# Patient Record
Sex: Male | Born: 1990
Health system: Southern US, Community
[De-identification: ages and names within clinical notes are randomized; demographics above are authoritative.]

## PROBLEM LIST (undated history)

## (undated) DIAGNOSIS — F32A Depression, unspecified: Secondary | ICD-10-CM

## (undated) DIAGNOSIS — Z8619 Personal history of other infectious and parasitic diseases: Secondary | ICD-10-CM

## (undated) DIAGNOSIS — R740 Nonspecific elevation of levels of transaminase and lactic acid dehydrogenase [LDH]: Principal | ICD-10-CM

## (undated) DIAGNOSIS — M2240 Chondromalacia patellae, unspecified knee: Secondary | ICD-10-CM

## (undated) DIAGNOSIS — T7840XA Allergy, unspecified, initial encounter: Secondary | ICD-10-CM

## (undated) DIAGNOSIS — M940 Chondrocostal junction syndrome [Tietze]: Secondary | ICD-10-CM

## (undated) DIAGNOSIS — K219 Gastro-esophageal reflux disease without esophagitis: Secondary | ICD-10-CM

## (undated) HISTORY — DX: Allergy, unspecified, initial encounter: T78.40XA

## (undated) HISTORY — DX: Chondrocostal junction syndrome (tietze): M94.0

## (undated) HISTORY — PX: OTHER SURGICAL HISTORY: SHX169

## (undated) HISTORY — DX: Depression, unspecified: F32.A

## (undated) HISTORY — DX: Gastro-esophageal reflux disease without esophagitis: K21.9

## (undated) HISTORY — DX: Personal history of other infectious and parasitic diseases: Z86.19

## (undated) HISTORY — DX: Nonspecific elevation of levels of transaminase and lactic acid dehydrogenase (ldh): R74.0

## (undated) HISTORY — DX: Chondromalacia patellae, unspecified knee: M22.40

## (undated) HISTORY — PX: HEMORRHOID BANDING: SHX5850

---

## 2009-03-12 DEATH — deceased

## 2012-05-03 ENCOUNTER — Ambulatory Visit: Payer: Self-pay | Admitting: Family Medicine

## 2013-05-17 LAB — CBC AND DIFFERENTIAL
HCT: 42 % (ref 41–53)
Hemoglobin: 14 g/dL (ref 13.5–17.5)
PLATELETS: 225 10*3/uL (ref 150–399)
WBC: 6.2 10^3/mL

## 2013-05-17 LAB — LIPID PANEL
Cholesterol: 188 mg/dL (ref 0–200)
HDL: 33 mg/dL — AB (ref 35–70)
LDL CALC: 111 mg/dL
TRIGLYCERIDES: 222 mg/dL — AB (ref 40–160)

## 2013-05-17 LAB — BASIC METABOLIC PANEL
BUN: 15 mg/dL (ref 4–21)
CREATININE: 0.9 mg/dL (ref 0.6–1.3)
Glucose: 87 mg/dL
Potassium: 4.6 mmol/L (ref 3.4–5.3)
SODIUM: 141 mmol/L (ref 137–147)

## 2013-05-17 LAB — TSH: TSH: 2.45 u[IU]/mL (ref 0.41–5.90)

## 2013-06-23 LAB — HEPATIC FUNCTION PANEL
ALT: 21 U/L (ref 10–40)
AST: 16 U/L (ref 14–40)

## 2015-12-30 DIAGNOSIS — T753XXA Motion sickness, initial encounter: Secondary | ICD-10-CM | POA: Insufficient documentation

## 2015-12-30 DIAGNOSIS — R519 Headache, unspecified: Secondary | ICD-10-CM | POA: Insufficient documentation

## 2015-12-30 DIAGNOSIS — J309 Allergic rhinitis, unspecified: Secondary | ICD-10-CM | POA: Insufficient documentation

## 2015-12-30 DIAGNOSIS — M791 Myalgia, unspecified site: Secondary | ICD-10-CM | POA: Insufficient documentation

## 2015-12-30 DIAGNOSIS — M940 Chondrocostal junction syndrome [Tietze]: Secondary | ICD-10-CM | POA: Insufficient documentation

## 2015-12-30 DIAGNOSIS — R748 Abnormal levels of other serum enzymes: Secondary | ICD-10-CM | POA: Insufficient documentation

## 2015-12-30 DIAGNOSIS — R51 Headache: Secondary | ICD-10-CM

## 2015-12-30 DIAGNOSIS — E669 Obesity, unspecified: Secondary | ICD-10-CM | POA: Insufficient documentation

## 2015-12-30 DIAGNOSIS — R002 Palpitations: Secondary | ICD-10-CM | POA: Insufficient documentation

## 2015-12-30 DIAGNOSIS — M2550 Pain in unspecified joint: Secondary | ICD-10-CM | POA: Insufficient documentation

## 2015-12-30 HISTORY — DX: Chondrocostal junction syndrome (tietze): M94.0

## 2015-12-31 ENCOUNTER — Ambulatory Visit (INDEPENDENT_AMBULATORY_CARE_PROVIDER_SITE_OTHER): Payer: BLUE CROSS/BLUE SHIELD | Admitting: Family Medicine

## 2015-12-31 ENCOUNTER — Encounter: Payer: Self-pay | Admitting: Family Medicine

## 2015-12-31 VITALS — BP 110/62 | HR 78 | Temp 98.0°F | Resp 18 | Wt 321.0 lb

## 2015-12-31 DIAGNOSIS — H6692 Otitis media, unspecified, left ear: Secondary | ICD-10-CM

## 2015-12-31 MED ORDER — AMOXICILLIN 500 MG PO CAPS: 1000.0000 mg | ORAL_CAPSULE | Freq: Two times a day (BID) | ORAL | Status: AC

## 2015-12-31 NOTE — Progress Notes (Signed)
       Patient: Jesus Gardner Male    DOB: 04/21/1991   25 y.o.   MRN: [id number] Visit Date: 12/31/2015  Today's Provider: Mila Merryonald Nasier Thumm, MD   Chief Complaint  Patient presents with  . Ear Pain   Subjective:    HPI Ear Pain:  Patient comes in complaining of left ear pain for the past 2 days. Symptoms started as a sensation that water was in his ear. He states the ear pain started shortly after. Patient has tried using ear drops for tinnitus  with no relief. Other associated symptoms includes left ear pressure, headache, nausea and vomiting last night due to pain and headache. Had some URI symptoms last week.     Allergies  Allergen Reactions  . Pollen Extract    Previous Medications   FEXOFENADINE-PSEUDOEPHEDRINE (ALLEGRA-D ALLERGY & CONGESTION) 180-240 MG 24 HR TABLET    Take 1 tablet by mouth daily.   FLUTICASONE (FLONASE) 50 MCG/ACT NASAL SPRAY    Place 2 sprays into both nostrils daily.   OMEGA-3 FATTY ACIDS (FISH OIL) 1000 MG CAPS    Take 2 capsules by mouth 2 (two) times daily.    Review of Systems  Constitutional: Negative for fever, chills, diaphoresis, appetite change and fatigue.  HENT: Positive for ear pain, hearing loss and tinnitus (roaring sound in left ear). Negative for congestion, ear discharge, facial swelling, sinus pressure, sneezing and sore throat.   Respiratory: Positive for cough. Negative for chest tightness, shortness of breath and wheezing.   Cardiovascular: Negative for chest pain and palpitations.  Gastrointestinal: Negative for nausea, vomiting and abdominal pain.    Social History  Substance Use Topics  . Smoking status: Never Smoker   . Smokeless tobacco: Not on file  . Alcohol Use: No   Objective:   BP 110/62 mmHg  Pulse 78  Temp(Src) 98 F (36.7 C) (Oral)  Resp 18  Wt 321 lb (145.605 kg)  SpO2 96%  Physical Exam  General Appearance:    Alert, cooperative, no distress  HENT:   right TM normal without fluid or infection, left TM  red, dull, bulging, neck without nodes, sinuses nontender and nasal mucosa congested  Eyes:    PERRL, conjunctiva/corneas clear, EOM's intact       Lungs:     Clear to auscultation bilaterally, respirations unlabored  Heart:    Regular rate and rhythm  Neurologic:   Awake, alert, oriented x 3. No apparent focal neurological           defect.           Assessment & Plan:     1. Acute left otitis media, recurrence not specified, unspecified otitis media type  - amoxicillin (AMOXIL) 500 MG capsule; Take 2 capsules (1,000 mg total) by mouth 2 (two) times daily.  Dispense: 40 capsule; Refill: 0  Call if symptoms change or if not rapidly improving.           Mila Merryonald Chikita Dogan, MD  [facility]

## 2016-01-01 ENCOUNTER — Encounter: Payer: Self-pay | Admitting: Family Medicine

## 2016-01-14 ENCOUNTER — Encounter: Payer: Self-pay | Admitting: Family Medicine

## 2016-01-17 ENCOUNTER — Encounter: Payer: Self-pay | Admitting: Family Medicine

## 2016-01-17 ENCOUNTER — Ambulatory Visit (INDEPENDENT_AMBULATORY_CARE_PROVIDER_SITE_OTHER): Payer: BLUE CROSS/BLUE SHIELD | Admitting: Family Medicine

## 2016-01-17 VITALS — BP 118/70 | HR 64 | Temp 98.2°F | Resp 18 | Ht 75.0 in | Wt 323.0 lb

## 2016-01-17 DIAGNOSIS — F40243 Fear of flying: Secondary | ICD-10-CM

## 2016-01-17 DIAGNOSIS — Z0000 Encounter for general adult medical examination without abnormal findings: Secondary | ICD-10-CM | POA: Diagnosis not present

## 2016-01-17 DIAGNOSIS — E669 Obesity, unspecified: Secondary | ICD-10-CM

## 2016-01-17 MED ORDER — ALPRAZOLAM 0.5 MG PO TABS: ORAL_TABLET | ORAL | Status: DC

## 2016-01-17 MED ORDER — ALPRAZOLAM 0.5 MG PO TABS
0.5000 mg | ORAL_TABLET | ORAL | Status: DC | PRN
Start: 2016-01-17 — End: 2016-01-17

## 2016-01-17 NOTE — Patient Instructions (Addendum)
   It is recommended to engage in 150 minutes of vigorous exercise every week.    Try to limit caloric intake to < 2000 calories/day

## 2016-01-17 NOTE — Progress Notes (Signed)
Patient ID: Jesus Gardner, male   DOB: November 05, 1990, 25 y.o.   MRN: [id number]       Patient: Jesus Gardner, Male    DOB: 1991-07-17, 25 y.o.   MRN: [id number] Visit Date: 01/17/2016  Today's Provider: Lelon Huh, MD   Chief Complaint  Patient presents with  . Annual Exam  . Hyperlipidemia  . Elevated Hepatic Enzymes   Subjective:    Annual physical exam Jesus Gardner is a 25 y.o. male who presents today for health maintenance and complete physical. He feels well. He reports exercising no regularly. He reports he is sleeping well. He sleeps on average 6 hours a night.    Lipid/Cholesterol, Follow-up:   Last seen for this6 months ago.  Management changes since that visit include starting fish oil OTC. Marland Kitchen Last Lipid Panel:    Component Value Date/Time   CHOL 188 05/17/2013   TRIG 222* 05/17/2013   HDL 33* 05/17/2013   LDLCALC 111 05/17/2013     He reports good compliance with treatment. He is not having side effects.  Current symptoms include none and have been stable. Weight trend: stable Prior visit with dietician: no Current diet: well balanced Current exercise: none  Wt Readings from Last 3 Encounters:  01/17/16 323 lb (146.512 kg)  12/31/15 321 lb (145.605 kg)  02/02/14 325 lb (147.419 kg)    -------------------------------------------------------------------  He also reports he is getting married next month and going to [place] for his Honeymoon, but has never flown in the past is somewhat anxious about it.     Review of Systems  Constitutional: Negative.   HENT: Negative.   Eyes: Negative.   Respiratory: Negative.   Cardiovascular: Negative.   Gastrointestinal: Negative.   Endocrine: Negative.   Genitourinary: Negative.   Musculoskeletal: Negative.   Skin: Negative.   Allergic/Immunologic: Positive for environmental allergies.  Neurological: Negative.   Hematological: Negative.   Psychiatric/Behavioral: Negative.     Social History      He   reports that he has never smoked. He does not have any smokeless tobacco history on file. He reports that he does not drink alcohol or use illicit drugs.       Social History   Social History  . Marital Status: Single    Spouse Name: N/A  . Number of Children: N/A  . Years of Education: N/A   Social History Main Topics  . Smoking status: Never Smoker   . Smokeless tobacco: None  . Alcohol Use: No  . Drug Use: No  . Sexual Activity: Not Asked   Other Topics Concern  . None   Social History Narrative    Past Medical History  Diagnosis Date  . History of chicken pox      Patient Active Problem List   Diagnosis Date Noted  . Allergic rhinitis 12/30/2015  . Arthralgia 12/30/2015  . Myalgia 12/30/2015  . Costochondritis 12/30/2015  . Palpitations 12/30/2015  . Headache 12/30/2015  . Elevated liver enzymes 12/30/2015  . Motion sickness 12/30/2015  . Obesity 12/30/2015  . Hyperlipidemia, mixed 03/06/2009  . Fam hx-ischem heart disease 10/12/1998    Past Surgical History  Procedure Laterality Date  . None      Family History        Family Status  Relation Status Death Age  . Mother Deceased   . Father Alive     history of substance abuse  . Sister Alive   . Brother Alive  His family history includes CAD in his father; Cerebrovascular Disease in his father; Diabetes in his father; Non-Hodgkin's lymphoma in his mother.    Allergies  Allergen Reactions  . Pollen Extract     Previous Medications   FEXOFENADINE-PSEUDOEPHEDRINE (ALLEGRA-D ALLERGY & CONGESTION) 180-240 MG 24 HR TABLET    Take 1 tablet by mouth daily.   FLUTICASONE (FLONASE) 50 MCG/ACT NASAL SPRAY    Place 2 sprays into both nostrils daily.   OMEGA-3 FATTY ACIDS (FISH OIL) 1000 MG CAPS    Take 2 capsules by mouth 2 (two) times daily.    Patient Care Team: Birdie Sons, MD as PCP - General (Family Medicine)     Objective:   Vitals: BP 118/70 mmHg  Pulse 64  Temp(Src) 98.2 F  (36.8 C)  Resp 18  Ht '6\' 3"'  (1.905 m)  Wt 323 lb (146.512 kg)  BMI 40.37 kg/m2  SpO2 97%   Physical Exam    General Appearance:    Alert, cooperative, no distress, appears stated age, obesity  Head:    Normocephalic, without obvious abnormality, atraumatic  Eyes:    PERRL, conjunctiva/corneas clear, EOM's intact, fundi    benign, both eyes       Ears:    Normal TM's and external ear canals, both ears  Nose:   Nares normal, septum midline, mucosa normal, no drainage   or sinus tenderness  Throat:   Lips, mucosa, and tongue normal; teeth and gums normal  Neck:   Supple, symmetrical, trachea midline, no adenopathy;       thyroid:  No enlargement/tenderness/nodules; no carotid   bruit or JVD  Back:     Symmetric, no curvature, ROM normal, no CVA tenderness  Lungs:     Clear to auscultation bilaterally, respirations unlabored  Chest wall:    No tenderness or deformity  Heart:    Regular rate and rhythm, S1 and S2 normal, no murmur, rub   or gallop  Abdomen:     Soft, non-tender, bowel sounds active all four quadrants,    no masses, no organomegaly  Genitalia:    deferred  Rectal:    deferred  Extremities:   Extremities normal, atraumatic, no cyanosis or edema  Pulses:   2+ and symmetric all extremities  Skin:   Skin color, texture, turgor normal, no rashes or lesions  Lymph nodes:   Cervical, supraclavicular, and axillary nodes normal  Neurologic:   CNII-XII intact. Normal strength, sensation and reflexes      throughout      Assessment & Plan:     Routine Health Maintenance and Physical Exam  Exercise Activities and Dietary recommendations Goals    None      Immunization History  Administered Date(s) Administered  . DTaP 12/18/1988, 02/16/1989, 04/26/1989, 04/29/1990, 02/13/1993  . Hepatitis B 08/06/2000, 09/10/2000, 02/11/2001  . HiB (PRP-OMP) 10/22/1989, 01/28/1990  . IPV 12/18/1988, 02/16/1989, 04/29/1990, 02/13/1993  . MMR 01/28/1990, 02/13/1993  . Td  03/24/2006  . Tdap 04/26/2012    Health Maintenance  Topic Date Due  . HIV Screening  10/21/2003  . INFLUENZA VACCINE  05/12/2016  . TETANUS/TDAP  04/26/2022      Discussed health benefits of physical activity, and encouraged him to engage in regular exercise appropriate for his age and condition.    --------------------------------------------------------------------  1. Annual physical exam Check labs today. He cannot return fasting due to schedule conflicts with work  - Comprehensive metabolic panel - Lipid panel  2. Obesity He is very  concerned about this, and has not been following his previous diet and exercise regiment to to time constraints with work. He is planning and getting on strict regiment to get his weight back down over the next few months.   3. Fear of flying  - ALPRAZolam (XANAX) 0.5 MG tablet; One tablet at least 30 minutes before flying, and every four hours as needed  Dispense: 10 tablet; Refill: 0

## 2016-01-18 LAB — COMPREHENSIVE METABOLIC PANEL
ALK PHOS: 54 IU/L (ref 39–117)
ALT: 54 IU/L — AB (ref 0–44)
AST: 151 IU/L — AB (ref 0–40)
Albumin/Globulin Ratio: 2 (ref 1.2–2.2)
Albumin: 4.5 g/dL (ref 3.5–5.5)
BILIRUBIN TOTAL: 0.4 mg/dL (ref 0.0–1.2)
BUN/Creatinine Ratio: 27 — ABNORMAL HIGH (ref 9–20)
BUN: 21 mg/dL — AB (ref 6–20)
CHLORIDE: 99 mmol/L (ref 96–106)
CO2: 27 mmol/L (ref 18–29)
CREATININE: 0.79 mg/dL (ref 0.76–1.27)
Calcium: 9.6 mg/dL (ref 8.7–10.2)
GFR calc Af Amer: 142 mL/min/{1.73_m2} (ref >59)
GFR calc non Af Amer: 123 mL/min/{1.73_m2} (ref >59)
GLUCOSE: 86 mg/dL (ref 65–99)
Globulin, Total: 2.3 g/dL (ref 1.5–4.5)
Potassium: 4.6 mmol/L (ref 3.5–5.2)
Sodium: 141 mmol/L (ref 134–144)
Total Protein: 6.8 g/dL (ref 6.0–8.5)

## 2016-01-18 LAB — LIPID PANEL
CHOLESTEROL TOTAL: 161 mg/dL (ref 100–199)
Chol/HDL Ratio: 5 ratio units (ref 0.0–5.0)
HDL: 32 mg/dL — ABNORMAL LOW (ref >39)
LDL CALC: 93 mg/dL (ref 0–99)
TRIGLYCERIDES: 178 mg/dL — AB (ref 0–149)
VLDL CHOLESTEROL CAL: 36 mg/dL (ref 5–40)

## 2016-01-22 ENCOUNTER — Encounter: Payer: Self-pay | Admitting: Family Medicine

## 2016-01-22 ENCOUNTER — Telehealth: Payer: Self-pay | Admitting: Family Medicine

## 2016-01-22 ENCOUNTER — Telehealth: Payer: Self-pay

## 2016-01-22 DIAGNOSIS — R945 Abnormal results of liver function studies: Principal | ICD-10-CM

## 2016-01-22 DIAGNOSIS — R7989 Other specified abnormal findings of blood chemistry: Secondary | ICD-10-CM

## 2016-01-22 NOTE — Telephone Encounter (Signed)
Pt mom, Renea Eevelyn called stating she has questions about the results of lab test.  [phone]/WJCB#[phone]/MW

## 2016-01-22 NOTE — Telephone Encounter (Signed)
-----   Message from Malva Limesonald E Fisher, MD sent at 01/22/2016  7:55 AM EDT ----- Liver functions are elevated. Need abdominal u/s to evaluate liver. Also, please add hepatitis B surface antibody, hepatitis B core antibody, and hepatitis C antibody to labs. Thanks.

## 2016-01-22 NOTE — Telephone Encounter (Signed)
Patient advised as directed below. Abdominal U/S ordered. Labs added on, awaiting confirmation.

## 2016-01-22 NOTE — Telephone Encounter (Signed)
Returned call to [place]. Renea Eevelyn wanted to make sure Dr. Sherrie MustacheFisher is aware that pt's mother died of Non-Hodgkin's lymphoma at age 27.

## 2016-01-24 ENCOUNTER — Telehealth: Payer: Self-pay | Admitting: Family Medicine

## 2016-01-24 LAB — HEPATITIS C ANTIBODY: Hep C Virus Ab: <0.1 s/co ratio (ref 0.0–0.9)

## 2016-01-24 LAB — HEPATITIS B SURFACE ANTIBODY,QUALITATIVE: Hep B Surface Ab, Qual: NONREACTIVE

## 2016-01-24 LAB — SPECIMEN STATUS REPORT

## 2016-01-24 LAB — HEPATITIS B CORE ANTIBODY, IGM: Hep B C IgM: NEGATIVE

## 2016-01-24 NOTE — Telephone Encounter (Signed)
Pt states Dr Sherrie MustacheFisher has advised he needs an ultra sound.  Pt states per his insurance this will cost him $500.00 out of pocket.  Pt staters he can not pay that right now and is asking if there is something that can be done less expensive.  [id number]#[phone]/VHCB#[phone]/MW

## 2016-01-27 NOTE — Telephone Encounter (Signed)
Pt states Dr Fisher has advised he needs an ultra sound.  Pt states per his insurance this will cost him $500.00 out of pocket.  Pt staters he can not pay that right now and is asking if there is something that can be done less expensive.  CB#[phone]/MW °

## 2016-01-27 NOTE — Telephone Encounter (Signed)
The most common cause of elevated liver enzymes is fatty liver disease, which is caused by being overweight and consuming to much sugar and starchy foods. He can get on a strict low sugar diet and work on losing weight and we can recheck liver functions in 3 months.

## 2016-01-27 NOTE — Telephone Encounter (Signed)
This is the wrong chart

## 2016-01-28 ENCOUNTER — Ambulatory Visit: Payer: BLUE CROSS/BLUE SHIELD

## 2016-01-28 NOTE — Telephone Encounter (Signed)
Patient's mom was notified. Expressed understanding.

## 2016-02-17 ENCOUNTER — Encounter: Payer: Self-pay | Admitting: Family Medicine

## 2016-02-17 ENCOUNTER — Ambulatory Visit (INDEPENDENT_AMBULATORY_CARE_PROVIDER_SITE_OTHER): Payer: BLUE CROSS/BLUE SHIELD | Admitting: Family Medicine

## 2016-02-17 ENCOUNTER — Ambulatory Visit: Payer: Self-pay | Admitting: Family Medicine

## 2016-02-17 VITALS — BP 110/70 | HR 67 | Temp 98.9°F | Resp 16 | Ht 75.0 in | Wt 315.0 lb

## 2016-02-17 DIAGNOSIS — H6691 Otitis media, unspecified, right ear: Secondary | ICD-10-CM

## 2016-02-17 MED ORDER — AMOXICILLIN 500 MG PO CAPS: 1000.0000 mg | ORAL_CAPSULE | Freq: Two times a day (BID) | ORAL | Status: AC

## 2016-02-17 NOTE — Progress Notes (Signed)
Patient: Jesus ShieldsMichael D Fitz Male    DOB: 10/25/1990   25 y.o.   MRN: [id number] Visit Date: 02/17/2016  Today's Provider: Mila Merryonald Fisher, MD   Chief Complaint  Patient presents with  . Ear Fullness    right   Subjective:    Ear Fullness  There is pain in the right ear. This is a new problem. The current episode started in the past 7 days (2 days). The problem occurs constantly. The problem has been unchanged. There has been no fever. The pain is mild. Associated symptoms include coughing and hearing loss. Pertinent negatives include no abdominal pain, diarrhea, ear discharge, headaches, neck pain, rash, rhinorrhea, sore throat or vomiting. Treatments tried: allergra D. The treatment provided mild relief. There is no history of a chronic ear infection, hearing loss or a tympanostomy tube.    Ear fullness for 2 days. Ear is stopped-up with mild pain. No fever and Mild cough. Has taken allegra d otc with mild relief.      Allergies  Allergen Reactions  . Pollen Extract    Previous Medications   FEXOFENADINE-PSEUDOEPHEDRINE (ALLEGRA-D ALLERGY & CONGESTION) 180-240 MG 24 HR TABLET    Take 1 tablet by mouth daily.   FLUTICASONE (FLONASE) 50 MCG/ACT NASAL SPRAY    Place 2 sprays into both nostrils daily.   MULTIPLE VITAMIN (MULTIVITAMIN) CAPSULE    Take 1 capsule by mouth daily.   OMEGA-3 FATTY ACIDS (FISH OIL) 1000 MG CAPS    Take 2 capsules by mouth 2 (two) times daily.    Review of Systems  Constitutional: Negative for fever, chills and appetite change.  HENT: Positive for congestion and hearing loss. Negative for ear discharge, rhinorrhea and sore throat.   Respiratory: Positive for cough. Negative for chest tightness, shortness of breath and wheezing.   Cardiovascular: Negative for chest pain and palpitations.  Gastrointestinal: Negative for nausea, vomiting, abdominal pain and diarrhea.  Musculoskeletal: Negative for neck pain.  Skin: Negative for rash.  Neurological:  Negative for headaches.    Social History  Substance Use Topics  . Smoking status: Never Smoker   . Smokeless tobacco: Not on file  . Alcohol Use: No   Objective:   BP 110/70 mmHg  Pulse 67  Temp(Src) 98.9 F (37.2 C) (Oral)  Resp 16  Ht 6\' 3"  (1.905 m)  Wt 315 lb (142.883 kg)  BMI 39.37 kg/m2  SpO2 97%  Physical Exam  General Appearance:    Alert, cooperative, no distress  HENT:   left TM normal without fluid or infection, left TM red, dull, bulging, neck without nodes, throat normal without erythema or exudate, sinuses nontender and nasal mucosa congested  Eyes:    PERRL, conjunctiva/corneas clear, EOM's intact       Lungs:     Clear to auscultation bilaterally, respirations unlabored  Heart:    Regular rate and rhythm  Neurologic:   Awake, alert, oriented x 3. No apparent focal neurological           defect.            Assessment & Plan:     1. Acute right otitis media, recurrence not specified, unspecified otitis media type   - amoxicillin (AMOXIL) 500 MG capsule; Take 2 capsules (1,000 mg total) by mouth 2 (two) times daily.  Dispense: 40 capsule; Refill: 0   Call if symptoms change or if not rapidly improving.          Mila Merryonald Fisher,  MD  [place]

## 2016-03-12 ENCOUNTER — Telehealth: Payer: Self-pay | Admitting: Family Medicine

## 2016-03-12 ENCOUNTER — Other Ambulatory Visit: Payer: Self-pay | Admitting: Family Medicine

## 2016-03-12 ENCOUNTER — Encounter: Payer: Self-pay | Admitting: Family Medicine

## 2016-03-12 DIAGNOSIS — R7401 Elevation of levels of liver transaminase levels: Secondary | ICD-10-CM | POA: Insufficient documentation

## 2016-03-12 DIAGNOSIS — R740 Nonspecific elevation of levels of transaminase and lactic acid dehydrogenase [LDH]: Principal | ICD-10-CM

## 2016-03-12 HISTORY — DX: Elevation of levels of liver transaminase levels: R74.01

## 2016-03-12 NOTE — Telephone Encounter (Signed)
Please advise patient it is time to recheck liver functions. Order has been printed. Please leave at front desk to pick up.

## 2016-03-13 NOTE — Telephone Encounter (Signed)
Left message to call back  

## 2016-03-18 NOTE — Telephone Encounter (Signed)
LMTCB ED 

## 2016-03-25 NOTE — Telephone Encounter (Signed)
Left detailed message on pt's vm. Have been unable to reach he by phone.

## 2016-04-27 ENCOUNTER — Telehealth: Payer: Self-pay | Admitting: Family Medicine

## 2016-04-27 NOTE — Telephone Encounter (Signed)
Pt returned call from 03/12/16. The message from Dr. Sherrie MustacheFisher on 03/12/16 was to advise pt it is time to recheck his liver function. Pt stated that he was on his honeymoon and had dropped his phone in the pool and just now got the messages. Please reorder the labs so pt can pick up a lab slip b/c it has been 30 days since the order was placed. Please advise. Thanks TNP

## 2016-04-27 NOTE — Telephone Encounter (Signed)
Returned pt's call. Notified that the lab slip is still good.

## 2016-05-01 DIAGNOSIS — R740 Nonspecific elevation of levels of transaminase and lactic acid dehydrogenase [LDH]: Secondary | ICD-10-CM | POA: Diagnosis not present

## 2016-05-02 LAB — HEPATIC FUNCTION PANEL
ALT: 19 IU/L (ref 0–44)
AST: 15 IU/L (ref 0–40)
Albumin: 4.3 g/dL (ref 3.5–5.5)
Alkaline Phosphatase: 54 IU/L (ref 39–117)
BILIRUBIN, DIRECT: 0.15 mg/dL (ref 0.00–0.40)
Bilirubin Total: 0.7 mg/dL (ref 0.0–1.2)
TOTAL PROTEIN: 6.8 g/dL (ref 6.0–8.5)

## 2016-05-04 ENCOUNTER — Telehealth: Payer: Self-pay

## 2016-05-04 NOTE — Telephone Encounter (Signed)
-----   Message from Malva Limes, MD sent at 05/04/2016  1:19 PM EDT ----- Liver functions are much better... Back down to normal. Avoid tylenol and avoid alcohol. Check liver functions about once a year.

## 2016-05-04 NOTE — Telephone Encounter (Signed)
Pt advised.   Thanks,   -Jesus Gardner  

## 2016-09-07 ENCOUNTER — Encounter: Payer: Self-pay | Admitting: Family Medicine

## 2016-09-07 ENCOUNTER — Ambulatory Visit (INDEPENDENT_AMBULATORY_CARE_PROVIDER_SITE_OTHER): Payer: 59 | Admitting: Family Medicine

## 2016-09-07 VITALS — BP 122/80 | HR 74 | Temp 98.9°F | Resp 16 | Ht 75.0 in | Wt 319.0 lb

## 2016-09-07 DIAGNOSIS — H66001 Acute suppurative otitis media without spontaneous rupture of ear drum, right ear: Secondary | ICD-10-CM | POA: Diagnosis not present

## 2016-09-07 MED ORDER — AMOXICILLIN 500 MG PO CAPS: 1000.0000 mg | ORAL_CAPSULE | Freq: Two times a day (BID) | ORAL | 0 refills | Status: AC

## 2016-09-07 NOTE — Progress Notes (Signed)
       Patient: Jesus ShieldsMichael D Schinke Male    DOB: 05/05/1991   25 y.o.   MRN: [id number] Visit Date: 09/07/2016  Today's Provider: Mila Merryonald Shantil Vallejo, MD   Chief Complaint  Patient presents with  . Ear Pain   Subjective:    HPI Patient comes in today c/o right ear pain. He reports that he has had symptoms X 3-4 days. He also mentions that he has had a cough (this started today), low grade temp, and nasal congestion. Patient has been taking his regular allergy medications along with ibuprofen for his symptoms with no relief. Patient reports that he does occasionally have dizzy spells that come and go. He reports that he has not had dizziness today.     Allergies  Allergen Reactions  . Pollen Extract      Current Outpatient Prescriptions:  .  fexofenadine-pseudoephedrine (ALLEGRA-D ALLERGY & CONGESTION) 180-240 MG 24 hr tablet, Take 1 tablet by mouth daily., Disp: , Rfl:  .  fluticasone (FLONASE) 50 MCG/ACT nasal spray, Place 2 sprays into both nostrils daily., Disp: , Rfl:  .  Multiple Vitamin (MULTIVITAMIN) capsule, Take 1 capsule by mouth daily., Disp: , Rfl:  .  Omega-3 Fatty Acids (FISH OIL) 1000 MG CAPS, Take 2 capsules by mouth 2 (two) times daily., Disp: , Rfl:   Review of Systems  Constitutional: Positive for fatigue and fever. Negative for activity change, appetite change, chills, diaphoresis and unexpected weight change.  HENT: Positive for congestion, ear pain and postnasal drip.   Respiratory: Positive for cough. Negative for apnea, chest tightness, shortness of breath, wheezing and stridor.     Social History  Substance Use Topics  . Smoking status: Never Smoker  . Smokeless tobacco: Not on file  . Alcohol use No   Objective:   BP 122/80 (BP Location: Left Arm, Patient Position: Sitting, Cuff Size: Large)   Pulse 74   Temp 98.9 F (37.2 C)   Resp 16   Ht 6\' 3"  (1.905 m)   Wt (!) 319 lb (144.7 kg)   SpO2 96%   BMI 39.87 kg/m   Physical Exam  General Appearance:     Alert, cooperative, no distress  HENT:   left TM normal without fluid or infection, right TM red, dull, bulging, bilateral TM fluid noted, neck without nodes, throat normal without erythema or exudate, sinuses nontender and nasal mucosa congested  Eyes:    PERRL, conjunctiva/corneas clear, EOM's intact       Lungs:     Clear to auscultation bilaterally, respirations unlabored  Heart:    Regular rate and rhythm  Neurologic:   Awake, alert, oriented x 3. No apparent focal neurological           defect.           Assessment & Plan:     1. Acute suppurative otitis media of right ear without spontaneous rupture of tympanic membrane, recurrence not specified  - amoxicillin (AMOXIL) 500 MG capsule; Take 2 capsules (1,000 mg total) by mouth 2 (two) times daily.  Dispense: 40 capsule; Refill: 0  Call if symptoms change or if not rapidly improving.          Mila Merryonald Regina Coppolino, MD  [facility]

## 2016-09-07 NOTE — Patient Instructions (Signed)
Otitis Media, Adult Otitis media is redness, soreness, and puffiness (swelling) in the space just behind your eardrum (middle ear). It may be caused by allergies or infection. It often happens along with a cold. Follow these instructions at home:  Take your medicine as told. Finish it even if you start to feel better.  Only take over-the-counter or prescription medicines for pain, discomfort, or fever as told by your doctor.  Follow up with your doctor as told. Contact a doctor if:  You have otitis media only in one ear, or bleeding from your nose, or both.  You notice a lump on your neck.  You are not getting better in 3-5 days.  You feel worse instead of better. Get help right away if:  You have pain that is not helped with medicine.  You have puffiness, redness, or pain around your ear.  You get a stiff neck.  You cannot move part of your face (paralysis).  You notice that the bone behind your ear hurts when you touch it. This information is not intended to replace advice given to you by your health care provider. Make sure you discuss any questions you have with your health care provider. Document Released: 03/16/2008 Document Revised: 03/05/2016 Document Reviewed: 04/25/2013 Elsevier Interactive Patient Education  2017 [employer]  

## 2017-09-28 ENCOUNTER — Encounter: Payer: Self-pay | Admitting: Family Medicine

## 2017-10-13 ENCOUNTER — Ambulatory Visit (INDEPENDENT_AMBULATORY_CARE_PROVIDER_SITE_OTHER): Payer: 59 | Admitting: Family Medicine

## 2017-10-13 ENCOUNTER — Encounter: Payer: Self-pay | Admitting: Family Medicine

## 2017-10-13 VITALS — BP 118/82 | HR 72 | Temp 98.5°F | Resp 16 | Ht 75.0 in | Wt 333.0 lb

## 2017-10-13 DIAGNOSIS — Z0000 Encounter for general adult medical examination without abnormal findings: Secondary | ICD-10-CM | POA: Diagnosis not present

## 2017-10-13 DIAGNOSIS — Z6841 Body Mass Index (BMI) 40.0 and over, adult: Secondary | ICD-10-CM | POA: Diagnosis not present

## 2017-10-13 NOTE — Progress Notes (Signed)
Patient: Jesus Gardner, Male    DOB: [birth year], 27 y.o.   MRN: [id number] Visit Date: 10/13/2017  Today's Provider: Lelon Huh, MD   Chief Complaint  Patient presents with  . Annual Exam  . Gastroesophageal Reflux   Subjective:    Annual physical exam Jesus Gardner is a 27 y.o. male who presents today for health maintenance and complete physical. He feels well. He reports exercising not regularly. He reports he is sleeping fairly well.   GERD symptoms Patient reports that he is beginning to have more issues with acid reflux. He reports that his symptoms are in relation to certain foods, more tomato based products in particular. He reports that this is happening more frequently.    Review of Systems  Constitutional: Negative.   HENT: Negative.   Eyes: Negative.   Respiratory: Negative.   Cardiovascular: Negative.   Gastrointestinal: Negative.   Endocrine: Negative.   Genitourinary: Negative.   Musculoskeletal: Negative.   Skin: Negative.   Allergic/Immunologic: Negative.   Neurological: Negative.   Hematological: Negative.   Psychiatric/Behavioral: Negative.     Social History      He  reports that  has never smoked. he has never used smokeless tobacco. He reports that he does not drink alcohol or use drugs.       Social History   Socioeconomic History  . Marital status: Married    Spouse name: Not on file  . Number of children: Not on file  . Years of education: Not on file  . Highest education level: Not on file  Social Needs  . Financial resource strain: Not on file  . Food insecurity - worry: Not on file  . Food insecurity - inability: Not on file  . Transportation needs - medical: Not on file  . Transportation needs - non-medical: Not on file  Occupational History  . 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 Smoking status: Never Smoker  . Smokeless tobacco: Never Used  Substance and  Sexual Activity  . Alcohol use: No    Alcohol/week: 0.0 oz  . Drug use: No  . Sexual activity: Not on file  Other Topics Concern  . Not on file  Social History Narrative  . Not on file    Past Medical History:  Diagnosis Date  . Elevated transaminase level 03/12/2016  . History of chicken pox      Patient Active Problem List   Diagnosis Date Noted  . Elevated transaminase level 03/12/2016  . Allergic rhinitis 12/30/2015  . Arthralgia 12/30/2015  . Myalgia 12/30/2015  . Costochondritis 12/30/2015  . Palpitations 12/30/2015  . Headache 12/30/2015  . Motion sickness 12/30/2015  . Obesity 12/30/2015  . Hyperlipidemia, mixed 03/06/2009  . Fam hx-ischem heart disease 10/12/1998    Past Surgical History:  Procedure Laterality Date  . None      Family History        Family Status  Relation Name Status  . Mother  Deceased  . Father  Alive       history of substance abuse  . Sister half- sister Alive  . Brother half -brother Alive        His family history includes CAD in his father; Cerebrovascular Disease in his father; Diabetes in his father; Non-Hodgkin's lymphoma (age of onset: 29) in his mother.     Allergies  Allergen Reactions  .  Pollen Extract      Current Outpatient Medications:  .  fexofenadine-pseudoephedrine (ALLEGRA-D ALLERGY & CONGESTION) 180-240 MG 24 hr tablet, Take 1 tablet by mouth daily., Disp: , Rfl:  .  fluticasone (FLONASE) 50 MCG/ACT nasal spray, Place 2 sprays into both nostrils daily., Disp: , Rfl:  .  Multiple Vitamin (MULTIVITAMIN) capsule, Take 1 capsule by mouth daily., Disp: , Rfl:  .  Omega-3 Fatty Acids (FISH OIL) 1000 MG CAPS, Take 2 capsules by mouth 2 (two) times daily., Disp: , Rfl:    Patient Care Team: Birdie Sons, MD as PCP - General (Family Medicine)      Objective:   Vitals: BP 118/82 (BP Location: Right Arm, Patient Position: Sitting, Cuff Size: Large)   Pulse 72   Temp 98.5 F (36.9 C)   Resp 16   Ht 6' 3"  (1.905 m)   Wt (!) 333 lb (151 kg)   SpO2 99%   BMI 41.62 kg/m    Vitals:   10/13/17 1348  BP: 118/82  Pulse: 72  Resp: 16  Temp: 98.5 F (36.9 C)  SpO2: 99%  Weight: (!) 333 lb (151 kg)  Height: 6' 3" (1.905 m)    Wt Readings from Last 3 Encounters:  10/13/17 (!) 333 lb (151 kg)  09/07/16 (!) 319 lb (144.7 kg)  02/17/16 (!) 315 lb (142.9 kg)     Physical Exam   General Appearance:    Alert, cooperative, no distress, appears stated age  Head:    Normocephalic, without obvious abnormality, atraumatic  Eyes:    PERRL, conjunctiva/corneas clear, EOM's intact, fundi    benign, both eyes       Ears:    Normal TM's and external ear canals, both ears  Nose:   Nares normal, septum midline, mucosa normal, no drainage   or sinus tenderness  Throat:   Lips, mucosa, and tongue normal; teeth and gums normal  Neck:   Supple, symmetrical, trachea midline, no adenopathy;       thyroid:  No enlargement/tenderness/nodules; no carotid   bruit or JVD  Back:     Symmetric, no curvature, ROM normal, no CVA tenderness  Lungs:     Clear to auscultation bilaterally, respirations unlabored  Chest wall:    No tenderness or deformity  Heart:    Regular rate and rhythm, S1 and S2 normal, no murmur, rub   or gallop  Abdomen:     Soft, non-tender, bowel sounds active all four quadrants,    no masses, no organomegaly  Genitalia:    deferred  Rectal:    deferred  Extremities:   Extremities normal, atraumatic, no cyanosis or edema  Pulses:   2+ and symmetric all extremities  Skin:   Skin color, texture, turgor normal, no rashes or lesions  Lymph nodes:   Cervical, supraclavicular, and axillary nodes normal  Neurologic:   CNII-XII intact. Normal strength, sensation and reflexes      throughout    Depression Screen PHQ 2/9 Scores 10/13/2017  PHQ - 2 Score 2  PHQ- 9 Score 4      Assessment & Plan:     Routine Health Maintenance and Physical Exam  Exercise Activities and Dietary  recommendations Goals    None      Immunization History  Administered Date(s) Administered  . DTaP 12/18/1988, 02/16/1989, 04/26/1989, 04/29/1990, 02/13/1993  . Hepatitis B 08/06/2000, 09/10/2000, 02/11/2001  . HiB (PRP-OMP) 10/22/1989, 01/28/1990  . IPV 12/18/1988, 02/16/1989, 04/29/1990, 02/13/1993  . MMR  01/28/1990, 02/13/1993  . Td 03/24/2006  . Tdap 04/26/2012    Health Maintenance  Topic Date Due  . HIV Screening  10/21/2003  . INFLUENZA VACCINE  01/10/2018 (Originally 05/12/2017)  . TETANUS/TDAP  04/26/2022     Discussed health benefits of physical activity, and encouraged him to engage in regular exercise appropriate for his age and condition.    --------------------------------------------------------------------  1. Annual physical exam He refused flu vaccine - TSH - Lipid panel - Comprehensive metabolic panel  2. Class 3 severe obesity without serious comorbidity with body mass index (BMI) of 40.0 to 44.9 in adult, unspecified obesity type ([place]) Counseled regarding prudent diet and regular exercise. Considering history of mildly elevated triglycerides, recommended low glycemic index diet.   - TSH - Comprehensive metabolic panel     Lelon Huh, MD  [facility]

## 2017-10-13 NOTE — Patient Instructions (Addendum)
   If you have acid reflux symptoms more than 3 days a week, you should take Prilosec OTC once a day   Gastroesophageal Reflux Disease, Adult Normally, food travels down the esophagus and stays in the stomach to be digested. If a person has gastroesophageal reflux disease (GERD), food and stomach acid move back up into the esophagus. When this happens, the esophagus becomes sore and swollen (inflamed). Over time, GERD can make small holes (ulcers) in the lining of the esophagus. Follow these instructions at home: Diet  Follow a diet as told by your doctor. You may need to avoid foods and drinks such as: ? Coffee and tea (with or without caffeine). ? Drinks that contain alcohol. ? Energy drinks and sports drinks. ? Carbonated drinks or sodas. ? Chocolate and cocoa. ? Peppermint and mint flavorings. ? Garlic and onions. ? Horseradish. ? Spicy and acidic foods, such as peppers, chili powder, curry powder, vinegar, hot sauces, and BBQ sauce. ? Citrus fruit juices and citrus fruits, such as oranges, lemons, and limes. ? Tomato-based foods, such as red sauce, chili, salsa, and pizza with red sauce. ? Fried and fatty foods, such as donuts, french fries, potato chips, and high-fat dressings. ? High-fat meats, such as hot dogs, rib eye steak, sausage, ham, and bacon. ? High-fat dairy items, such as whole milk, butter, and cream cheese.  Eat small meals often. Avoid eating large meals.  Avoid drinking large amounts of liquid with your meals.  Avoid eating meals during the 2-3 hours before bedtime.  Avoid lying down right after you eat.  Do not exercise right after you eat. General instructions  Pay attention to any changes in your symptoms.  Take over-the-counter and prescription medicines only as told by your doctor. Do not take aspirin, ibuprofen, or other NSAIDs unless your doctor says it is okay.  Do not use any tobacco products, including cigarettes, chewing tobacco, and  e-cigarettes. If you need help quitting, ask your doctor.  Wear loose clothes. Do not wear anything tight around your waist.  Raise (elevate) the head of your bed about 6 inches (15 cm).  Try to lower your stress. If you need help doing this, ask your doctor.  If you are overweight, lose an amount of weight that is healthy for you. Ask your doctor about a safe weight loss goal.  Keep all follow-up visits as told by your doctor. This is important. Contact a doctor if:  You have new symptoms.  You lose weight and you do not know why it is happening.  You have trouble swallowing, or it hurts to swallow.  You have wheezing or a cough that keeps happening.  Your symptoms do not get better with treatment.  You have a hoarse voice. Get help right away if:  You have pain in your arms, neck, jaw, teeth, or back.  You feel sweaty, dizzy, or light-headed.  You have chest pain or shortness of breath.  You throw up (vomit) and your throw up looks like blood or coffee grounds.  You pass out (faint).  Your poop (stool) is bloody or black.  You cannot swallow, drink, or eat. This information is not intended to replace advice given to you by your health care provider. Make sure you discuss any questions you have with your health care provider. Document Released: 03/16/2008 Document Revised: 03/05/2016 Document Reviewed: 01/23/2015 Elsevier Interactive Patient Education  [employer]2018 Elsevier Inc.

## 2017-10-14 DIAGNOSIS — Z0000 Encounter for general adult medical examination without abnormal findings: Secondary | ICD-10-CM | POA: Diagnosis not present

## 2017-10-14 DIAGNOSIS — Z6841 Body Mass Index (BMI) 40.0 and over, adult: Secondary | ICD-10-CM | POA: Diagnosis not present

## 2017-10-15 ENCOUNTER — Telehealth: Payer: Self-pay | Admitting: Emergency Medicine

## 2017-10-15 LAB — COMPREHENSIVE METABOLIC PANEL
A/G RATIO: 1.7 (ref 1.2–2.2)
ALBUMIN: 4.3 g/dL (ref 3.5–5.5)
ALT: 34 IU/L (ref 0–44)
AST: 25 IU/L (ref 0–40)
Alkaline Phosphatase: 54 IU/L (ref 39–117)
BILIRUBIN TOTAL: 0.4 mg/dL (ref 0.0–1.2)
BUN / CREAT RATIO: 16 (ref 9–20)
BUN: 13 mg/dL (ref 6–20)
CHLORIDE: 105 mmol/L (ref 96–106)
CO2: 18 mmol/L — ABNORMAL LOW (ref 20–29)
Calcium: 9.1 mg/dL (ref 8.7–10.2)
Creatinine, Ser: 0.81 mg/dL (ref 0.76–1.27)
GFR calc Af Amer: 140 mL/min/{1.73_m2} (ref >59)
GFR calc non Af Amer: 121 mL/min/{1.73_m2} (ref >59)
GLOBULIN, TOTAL: 2.6 g/dL (ref 1.5–4.5)
Glucose: 96 mg/dL (ref 65–99)
POTASSIUM: 5 mmol/L (ref 3.5–5.2)
SODIUM: 141 mmol/L (ref 134–144)
TOTAL PROTEIN: 6.9 g/dL (ref 6.0–8.5)

## 2017-10-15 LAB — LIPID PANEL
CHOL/HDL RATIO: 5.2 ratio — AB (ref 0.0–5.0)
Cholesterol, Total: 187 mg/dL (ref 100–199)
HDL: 36 mg/dL — AB (ref >39)
LDL Calculated: 124 mg/dL — ABNORMAL HIGH (ref 0–99)
TRIGLYCERIDES: 137 mg/dL (ref 0–149)
VLDL Cholesterol Cal: 27 mg/dL (ref 5–40)

## 2017-10-15 LAB — TSH: TSH: 1.79 u[IU]/mL (ref 0.450–4.500)

## 2017-10-15 NOTE — Telephone Encounter (Signed)
LMTCB

## 2017-10-15 NOTE — Telephone Encounter (Signed)
-----   Message from Malva Limesonald E Fisher, MD sent at 10/15/2017  9:19 AM EST ----- LDL cholesterol a little bit high at 124, should be under 100, not high enough to require medications. Just eat healthy diet. Otherwise labs normal. Check lipids every 2-3 years.

## 2017-10-15 NOTE — Telephone Encounter (Signed)
Pt informed and voiced understanding of results. 

## 2018-02-24 ENCOUNTER — Other Ambulatory Visit: Payer: Self-pay | Admitting: Family Medicine

## 2018-02-24 MED ORDER — FLUTICASONE PROPIONATE 50 MCG/ACT NA SUSP: 2.0000 | Freq: Every day | NASAL | 5 refills | Status: DC

## 2018-02-24 NOTE — Telephone Encounter (Signed)
Pt called wanting to know if he could get a refill on his nasal spray.  Fluticasone 50 MCG  [facility]  Pt's call back is [phone]  Thanks teri

## 2018-09-02 ENCOUNTER — Ambulatory Visit (INDEPENDENT_AMBULATORY_CARE_PROVIDER_SITE_OTHER): Payer: 59 | Admitting: Family Medicine

## 2018-09-02 ENCOUNTER — Encounter: Payer: Self-pay | Admitting: Family Medicine

## 2018-09-02 VITALS — BP 122/78 | HR 72 | Temp 98.5°F | Resp 16 | Wt 315.0 lb

## 2018-09-02 DIAGNOSIS — N50811 Right testicular pain: Secondary | ICD-10-CM

## 2018-09-02 DIAGNOSIS — R079 Chest pain, unspecified: Secondary | ICD-10-CM | POA: Diagnosis not present

## 2018-09-02 MED ORDER — CIPROFLOXACIN HCL 500 MG PO TABS: 500.0000 mg | ORAL_TABLET | Freq: Two times a day (BID) | ORAL | 0 refills | Status: DC

## 2018-09-02 NOTE — Progress Notes (Addendum)
Patient: Jesus ShieldsMichael D Ethridge Male    DOB: 08/27/1991   27 y.o.   MRN: [id number] Visit Date: 09/02/2018  Today's Provider: Mila Merryonald Sheletha Bow, MD   Chief Complaint  Patient presents with  . Groin Pain   Subjective:    HPI Groin pain: Patient comes in complaining of pain and pressure of the right groin. Symptoms started about 2 months ago. He states he felt a sharp pain a few days ago at the end of intercourse and radiated into his back, and has since changed into a dull achy pain. Patient says today he feels a slight numbness in the right groin. Has had no injury that he knows of, has had no swelling or bulging.   He also reports that while driving to visit today he started having mild mid-sternal chest pain and feels like indigestion, as he has had more troubles with heart burn lately. Since EKG was done today, he feels like pain has mostly resolved.     Allergies  Allergen Reactions  . Pollen Extract      Current Outpatient Medications:  .  fexofenadine-pseudoephedrine (ALLEGRA-D ALLERGY & CONGESTION) 180-240 MG 24 hr tablet, Take 1 tablet by mouth daily., Disp: , Rfl:  .  fluticasone (FLONASE) 50 MCG/ACT nasal spray, Place 2 sprays into both nostrils daily., Disp: 16 g, Rfl: 5 .  Multiple Vitamin (MULTIVITAMIN) capsule, Take 1 capsule by mouth daily., Disp: , Rfl:  .  Omega-3 Fatty Acids (FISH OIL) 1000 MG CAPS, Take 2 capsules by mouth 2 (two) times daily., Disp: , Rfl:   Review of Systems  Constitutional: Negative for appetite change, chills and fever.  Respiratory: Negative for chest tightness, shortness of breath and wheezing.   Cardiovascular: Positive for chest pain (started today). Negative for palpitations.  Gastrointestinal: Negative for abdominal pain, nausea and vomiting.  Genitourinary:       Right side groin pain    Social History   Tobacco Use  . Smoking status: Never Smoker  . Smokeless tobacco: Never Used  Substance Use Topics  . Alcohol use: Yes   Alcohol/week: 0.0 standard drinks    Comment: occasionally   Objective:   BP 122/78 (BP Location: Left Arm, Patient Position: Sitting, Cuff Size: Large)   Pulse 72   Temp 98.5 F (36.9 C) (Oral)   Resp 16   Wt (!) 315 lb (142.9 kg)   SpO2 96% Comment: room air  BMI 39.37 kg/m     Physical Exam  General Appearance:    Alert, cooperative, no distress  GU:   Slight tenderness of right testicle. No masses, no bulging, no inguinal, ,scrotal, or femoral hernia.   Abdomen:   bowel sounds present and normal in all 4 quadrants. No CVA tenderness   EKG: WNL    Assessment & Plan:     1. Chest pain, unspecified type Resolved after EKG was completed, Likely GERD. He has started OTC H2 blocker which states has helped.is to call if any additional episodes.  - EKG 12-Lead  2. Testicular pain, right Radiating into groin with episode of very sharp pain with ejaculation. Suspect epididymitis or prostatitis. Will treat empirically with - ciprofloxacin (CIPRO) 500 MG tablet; Take 1 tablet (500 mg total) by mouth 2 (two) times daily for 14 days.  Dispense: 28 tablet; Refill: 0  Will contact patient when finished antibiotic. If not better will consider imaging studies.        Mila Merryonald Hamdan Toscano, MD  [facility]  [place]

## 2018-09-15 ENCOUNTER — Telehealth: Payer: Self-pay

## 2018-09-15 DIAGNOSIS — N50811 Right testicular pain: Secondary | ICD-10-CM

## 2018-09-15 MED ORDER — CIPROFLOXACIN HCL 500 MG PO TABS: 500.0000 mg | ORAL_TABLET | Freq: Two times a day (BID) | ORAL | 0 refills | Status: DC

## 2018-09-15 NOTE — Telephone Encounter (Signed)
Patient called office stating that he was seen by Dr. Sherrie MustacheFisher on 11/22 with complaints of groin pain and pressure. Patient states that Dr. Sherrie MustacheFisher had placed him on antibiotic and it did help with symptoms. Patient reports in the past few days he has noticed that pain and pressure has returned. Patient would like for CMA to give him a call back. KW

## 2018-09-15 NOTE — Telephone Encounter (Signed)
Pt advised.   Thanks,   -Ferd Horrigan  

## 2018-09-15 NOTE — Telephone Encounter (Signed)
Have [facility]. He probably has prostate infection. Call if symptoms flare back up after finishing this round of antibiotic.

## 2018-10-26 ENCOUNTER — Encounter: Payer: Self-pay | Admitting: Physician Assistant

## 2018-10-26 ENCOUNTER — Ambulatory Visit (INDEPENDENT_AMBULATORY_CARE_PROVIDER_SITE_OTHER): Payer: 59 | Admitting: Physician Assistant

## 2018-10-26 VITALS — BP 116/78 | HR 75 | Temp 98.5°F | Resp 16 | Wt 320.0 lb

## 2018-10-26 DIAGNOSIS — J069 Acute upper respiratory infection, unspecified: Secondary | ICD-10-CM | POA: Diagnosis not present

## 2018-10-26 DIAGNOSIS — B9789 Other viral agents as the cause of diseases classified elsewhere: Secondary | ICD-10-CM

## 2018-10-26 NOTE — Patient Instructions (Signed)

## 2018-10-26 NOTE — Progress Notes (Signed)
Patient: Jesus Gardner Male    DOB: 12/24/1990   28 y.o.   MRN: [id number] Visit Date: 10/26/2018  Today's Provider: Trey Sailors, PA-C   Chief Complaint  Patient presents with  . Ear Pain   Subjective:     HPI Upper Respiratory Infection: Patient complains of symptoms of a URI. Symptoms include right ear pain and congestion. Onset of symptoms was 1 day ago, rapidly worsening since that time. He also c/o right ear pressure/pain, congestion, fever 101.7 Saturday and lightheadedness for the past 1 day .  He is drinking plenty of fluids. Evaluation to date: none. Treatment to date: cough suppressants and decongestants.    Allergies  Allergen Reactions  . Pollen Extract      Current Outpatient Medications:  .  fexofenadine-pseudoephedrine (ALLEGRA-D ALLERGY & CONGESTION) 180-240 MG 24 hr tablet, Take 1 tablet by mouth daily., Disp: , Rfl:  .  fluticasone (FLONASE) 50 MCG/ACT nasal spray, Place 2 sprays into both nostrils daily., Disp: 16 g, Rfl: 5 .  Multiple Vitamin (MULTIVITAMIN) capsule, Take 1 capsule by mouth daily., Disp: , Rfl:  .  Omega-3 Fatty Acids (FISH OIL) 1000 MG CAPS, Take 2 capsules by mouth 2 (two) times daily., Disp: , Rfl:   Review of Systems  Constitutional: Negative.   HENT: Positive for congestion and ear pain.   Neurological: Positive for dizziness and light-headedness.    Social History   Tobacco Use  . Smoking status: Never Smoker  . Smokeless tobacco: Never Used  Substance Use Topics  . Alcohol use: Yes    Alcohol/week: 0.0 standard drinks    Comment: occasionally      Objective:   BP 116/78 (BP Location: Right Arm, Patient Position: Sitting, Cuff Size: Large)   Pulse 75   Temp 98.5 F (36.9 C) (Oral)   Resp 16   Wt (!) 320 lb (145.2 kg)   SpO2 96%   BMI 40.00 kg/m  Vitals:   10/26/18 1557  BP: 116/78  Pulse: 75  Resp: 16  Temp: 98.5 F (36.9 C)  TempSrc: Oral  SpO2: 96%  Weight: (!) 320 lb (145.2 kg)      Physical Exam Constitutional:      General: He is not in acute distress.    Appearance: He is well-developed. He is not diaphoretic.  HENT:     Right Ear: Tympanic membrane and external ear normal.     Left Ear: Tympanic membrane and external ear normal.     Nose: Rhinorrhea present.     Right Sinus: No maxillary sinus tenderness or frontal sinus tenderness.     Left Sinus: No maxillary sinus tenderness or frontal sinus tenderness.     Mouth/Throat:     Pharynx: Uvula midline. No oropharyngeal exudate.  Eyes:     General:        Right eye: Discharge present.        Left eye: Discharge present.    Conjunctiva/sclera: Conjunctivae normal.     Comments: Watery Discharge   Neck:     Musculoskeletal: Normal range of motion and neck supple.  Cardiovascular:     Rate and Rhythm: Normal rate and regular rhythm.  Pulmonary:     Effort: Pulmonary effort is normal. No respiratory distress.     Breath sounds: Normal breath sounds. No wheezing or rales.  Lymphadenopathy:     Cervical: No cervical adenopathy.  Skin:    General: Skin is warm and dry.  Neurological:  Mental Status: He is alert and oriented to person, place, and time.  Psychiatric:        Behavior: Behavior normal.         Assessment & Plan    1. Viral URI with cough  Counseled regarding signs and symptoms of viral and bacterial respiratory infections. Advised to call or return for additional evaluation if he develops any sign of bacterial infection, or if current symptoms last longer than 10 days. Can continue to use Allegra and flonase.  Return if symptoms worsen or fail to improve.  The entirety of the information documented in the History of Present Illness, Review of Systems and Physical Exam were personally obtained by me. Portions of this information were initially documented by Rondel Baton, CMA and reviewed by me for thoroughness and accuracy.        Trey Sailors, PA-C  [facility]

## 2018-11-01 ENCOUNTER — Telehealth: Payer: Self-pay | Admitting: Family Medicine

## 2018-11-01 DIAGNOSIS — N50811 Right testicular pain: Secondary | ICD-10-CM

## 2018-11-01 NOTE — Telephone Encounter (Signed)
Needs to proceed with ultrasound, have entered order.

## 2018-11-01 NOTE — Telephone Encounter (Signed)
Please advise 

## 2018-11-01 NOTE — Telephone Encounter (Signed)
Pt stated the last time he saw Dr. Sherrie MustacheFisher he was Dx with prostate infection and that he was to call back to advise how he is doing. Pt stated that his symptoms had been improving but recently the burning when urinating has returned and feels pressure when having sex. Pt is requesting call back to discuss if he should move forward with US or need a F/U visit. Please advise. Thanks TNP

## 2018-11-01 NOTE — Telephone Encounter (Signed)
Spoke to pt and advised that he needs to do the [place] and advised where the order was sent and to go for the [place].  dbs

## 2018-11-02 ENCOUNTER — Ambulatory Visit: Payer: BLUE CROSS/BLUE SHIELD

## 2018-11-02 ENCOUNTER — Telehealth: Payer: Self-pay | Admitting: Family Medicine

## 2018-11-02 NOTE — Addendum Note (Signed)
Addended by: Malva Limes on: 11/02/2018 10:45 AM   Modules accepted: Orders

## 2018-11-02 NOTE — Telephone Encounter (Signed)
Order changed as requested.

## 2018-11-02 NOTE — Telephone Encounter (Signed)
Per [facility] order will need to be changed to ultrasound scrotum with doppler [id number] in order to be scheduled

## 2018-11-03 ENCOUNTER — Ambulatory Visit
Admission: RE | Admit: 2018-11-03 | Discharge: 2018-11-03 | Disposition: A | Discharge status: Home / Self Care | Payer: 59 | Source: Ambulatory Visit | Attending: Family Medicine | Admitting: Family Medicine

## 2018-11-03 DIAGNOSIS — N50811 Right testicular pain: Secondary | ICD-10-CM | POA: Diagnosis not present

## 2018-11-04 ENCOUNTER — Telehealth: Payer: Self-pay

## 2018-11-04 DIAGNOSIS — N50811 Right testicular pain: Secondary | ICD-10-CM

## 2018-11-04 MED ORDER — CIPROFLOXACIN HCL 500 MG PO TABS: 500.0000 mg | ORAL_TABLET | Freq: Two times a day (BID) | ORAL | 0 refills | Status: DC

## 2018-11-04 NOTE — Telephone Encounter (Signed)
-----   Message from Malva Limes, MD sent at 11/04/2018  8:03 AM EST ----- Ultrasound is normal. He likely has epididymis infection. If the ciprofloxacin helped in November we can send a refill. If antibiotic doesn't help then I would suggest seeing urologist.

## 2018-11-04 NOTE — Telephone Encounter (Signed)
Pt advised.  He states Cipro helped in November but it took two rounds.  He would like to try that first before proceeding with the referral.   Pt uses [facility].   Thanks,   -Vernona Rieger

## 2018-11-16 ENCOUNTER — Ambulatory Visit (INDEPENDENT_AMBULATORY_CARE_PROVIDER_SITE_OTHER): Payer: 59 | Admitting: Family Medicine

## 2018-11-16 ENCOUNTER — Encounter: Payer: Self-pay | Admitting: Family Medicine

## 2018-11-16 VITALS — BP 112/64 | HR 68 | Temp 98.9°F | Resp 16 | Ht 75.0 in | Wt 326.0 lb

## 2018-11-16 DIAGNOSIS — E782 Mixed hyperlipidemia: Secondary | ICD-10-CM

## 2018-11-16 DIAGNOSIS — R7401 Elevation of levels of liver transaminase levels: Secondary | ICD-10-CM

## 2018-11-16 DIAGNOSIS — Z6841 Body Mass Index (BMI) 40.0 and over, adult: Secondary | ICD-10-CM | POA: Diagnosis not present

## 2018-11-16 DIAGNOSIS — Z23 Encounter for immunization: Secondary | ICD-10-CM | POA: Diagnosis not present

## 2018-11-16 DIAGNOSIS — G4710 Hypersomnia, unspecified: Secondary | ICD-10-CM

## 2018-11-16 DIAGNOSIS — Z0000 Encounter for general adult medical examination without abnormal findings: Secondary | ICD-10-CM

## 2018-11-16 DIAGNOSIS — R740 Nonspecific elevation of levels of transaminase and lactic acid dehydrogenase [LDH]: Secondary | ICD-10-CM

## 2018-11-16 NOTE — Addendum Note (Signed)
Addended by: Kavin Leech E on: 11/16/2018 04:23 PM   Modules accepted: Orders

## 2018-11-16 NOTE — Progress Notes (Signed)
Patient: Jesus Gardner, Male    DOB: [birth year], 28 y.o.   MRN: [id number] Visit Date: 11/16/2018  Today's Provider: Lelon Huh, MD   Chief Complaint  Patient presents with  . Annual Exam   Subjective:     Annual physical exam Jesus Gardner is a 28 y.o. male who presents today for health maintenance and complete physical. He feels fairly well.  Pt reports he has a headache today.   He reports exercising regularly. He reports he is sleeping fairly well.  ----------------------------------------------------------------- He has flu symptoms of aches, cough, fever, sore throat and headaches a few weeks ago, which has mostly resolved, but still has mild persistent cough  He also reports that he feel very fatigued and sleepy even when he first gets out of bed in the morning. Has been getting worse over the last year or two. He got a home gym, but feel too fatigued to use it consistently.   Review of Systems  Constitutional: Negative.   HENT: Positive for congestion. Negative for dental problem, drooling, ear discharge, ear pain, facial swelling, hearing loss, mouth sores, nosebleeds, postnasal drip, rhinorrhea, sinus pressure, sinus pain, sneezing, sore throat, tinnitus, trouble swallowing and voice change.   Eyes: Negative.   Respiratory: Negative.   Cardiovascular: Negative.   Gastrointestinal: Negative.   Endocrine: Negative.   Genitourinary: Positive for dysuria. Negative for decreased urine volume, difficulty urinating, discharge, enuresis, flank pain, frequency, genital sores, hematuria, penile pain, penile swelling, scrotal swelling, testicular pain and urgency.  Musculoskeletal: Negative.   Allergic/Immunologic: Negative.   Neurological: Positive for headaches. Negative for dizziness, tremors, seizures, syncope, facial asymmetry, speech difficulty, weakness, light-headedness and numbness.  Hematological: Negative.   Psychiatric/Behavioral: Negative.     Social  History      He  reports that he has never smoked. He has never used smokeless tobacco. He reports current alcohol use. He reports that he does not use drugs.       Social History   Socioeconomic History  . Marital status: Married    Spouse name: Not on file  . Number of children: Not on file  . Years of education: Not on file  . Highest education level: Not on file  Occupational History  . 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 Financial resource strain: Not on file  . Food insecurity:    Worry: Not on file    Inability: Not on file  . Transportation needs:    Medical: Not on file    Non-medical: Not on file  Tobacco Use  . Smoking status: Never Smoker  . Smokeless tobacco: Never Used  Substance and Sexual Activity  . Alcohol use: Yes    Alcohol/week: 0.0 standard drinks    Comment: occasionally  . Drug use: No  . Sexual activity: Not on file  Lifestyle  . Physical activity:    Days per week: Not on file    Minutes per session: Not on file  . Stress: Not on file  Relationships  . Social connections:    Talks on phone: Not on file    Gets together: Not on file    Attends religious service: Not on file    Active member of club or organization: Not on file    Attends meetings of clubs or organizations: Not on file    Relationship status: Not on file  Other Topics  Concern  . Not on file  Social History Narrative  . Not on file    Past Medical History:  Diagnosis Date  . Elevated transaminase level 03/12/2016  . History of chicken pox      Patient Active Problem List   Diagnosis Date Noted  . Elevated transaminase level 03/12/2016  . Allergic rhinitis 12/30/2015  . Arthralgia 12/30/2015  . Myalgia 12/30/2015  . Costochondritis 12/30/2015  . Palpitations 12/30/2015  . Headache 12/30/2015  . Motion sickness 12/30/2015  . Obesity 12/30/2015  . Hyperlipidemia, mixed 03/06/2009  . Fam hx-ischem heart  disease 10/12/1998    Past Surgical History:  Procedure Laterality Date  . None      Family History        Family Status  Relation Name Status  . Mother  Deceased  . Father  Alive       history of substance abuse  . Sister half- sister Alive  . Brother half -brother Alive        His family history includes CAD in his father; Cerebrovascular Disease in his father; Diabetes in his father; Non-Hodgkin's lymphoma (age of onset: 39) in his mother.      Allergies  Allergen Reactions  . Pollen Extract      Current Outpatient Medications:  .  ciprofloxacin (CIPRO) 500 MG tablet, Take 1 tablet (500 mg total) by mouth 2 (two) times daily for 28 days., Disp: 56 tablet, Rfl: 0 .  fexofenadine-pseudoephedrine (ALLEGRA-D ALLERGY & CONGESTION) 180-240 MG 24 hr tablet, Take 1 tablet by mouth daily., Disp: , Rfl:  .  fluticasone (FLONASE) 50 MCG/ACT nasal spray, Place 2 sprays into both nostrils daily., Disp: 16 g, Rfl: 5 .  Multiple Vitamin (MULTIVITAMIN) capsule, Take 1 capsule by mouth daily., Disp: , Rfl:  .  Omega-3 Fatty Acids (FISH OIL) 1000 MG CAPS, Take 2 capsules by mouth 2 (two) times daily., Disp: , Rfl:    Patient Care Team: Birdie Sons, MD as PCP - General (Family Medicine)    Objective:    Vitals: BP 112/64 (BP Location: Right Arm, Patient Position: Sitting, Cuff Size: Large)   Pulse 68   Temp 98.9 F (37.2 C) (Oral)   Resp 16   Ht '6\' 3"'  (1.905 m)   Wt (!) 326 lb (147.9 kg)   BMI 40.75 kg/m       Physical Exam   General Appearance:    Alert, cooperative, no distress, appears stated age, morbidly obese  Head:    Normocephalic, without obvious abnormality, atraumatic  Eyes:    PERRL, conjunctiva/corneas clear, EOM's intact, fundi    benign, both eyes       Ears:    Normal TM's and external ear canals, both ears  Nose:   Nares normal, septum midline, mucosa normal, no drainage   or sinus tenderness  Throat:   Lips, mucosa, and tongue normal; teeth and  gums normal  Neck:   Supple, symmetrical, trachea midline, no adenopathy;       thyroid:  No enlargement/tenderness/nodules; no carotid   bruit or JVD  Back:     Symmetric, no curvature, ROM normal, no CVA tenderness  Lungs:     Clear to auscultation bilaterally, respirations unlabored  Chest wall:    No tenderness or deformity  Heart:    Regular rate and rhythm, S1 and S2 normal, no murmur, rub   or gallop  Abdomen:     Soft, non-tender, bowel sounds active all four quadrants,  no masses, no organomegaly  Genitalia:    deferred  Rectal:    deferred  Extremities:   Extremities normal, atraumatic, no cyanosis or edema  Pulses:   2+ and symmetric all extremities  Skin:   Skin color, texture, turgor normal, no rashes or lesions  Lymph nodes:   Cervical, supraclavicular, and axillary nodes normal  Neurologic:   CNII-XII intact. Normal strength, sensation and reflexes      throughout    Depression Screen PHQ 2/9 Scores 11/16/2018 10/13/2017  PHQ - 2 Score 1 2  PHQ- 9 Score 3 4       Assessment & Plan:     Routine Health Maintenance and Physical Exam  Exercise Activities and Dietary recommendations Goals   None     Immunization History  Administered Date(s) Administered  . DTaP 12/18/1988, 02/16/1989, 04/26/1989, 04/29/1990, 02/13/1993  . Hepatitis B 08/06/2000, 09/10/2000, 02/11/2001  . HiB (PRP-OMP) 10/22/1989, 01/28/1990  . IPV 12/18/1988, 02/16/1989, 04/29/1990, 02/13/1993  . MMR 01/28/1990, 02/13/1993  . Td 03/24/2006  . Tdap 04/26/2012    Health Maintenance  Topic Date Due  . HIV Screening  10/21/2003  . INFLUENZA VACCINE  05/12/2018  . TETANUS/TDAP  04/26/2022     Discussed health benefits of physical activity, and encouraged him to engage in regular exercise appropriate for his age and condition.    -------------------------------------------------------------------- 1. Annual physical exam  - Lipid panel  2. Elevated transaminase level -  Comprehensive metabolic panel  3. Hyperlipidemia, mixed   4. Hypersomnia  - Home sleep test  5. Class 3 severe obesity without serious comorbidity with body mass index (BMI) of 40.0 to 44.9 in adult, unspecified obesity type ([place]) Encouraged regular exercise and improving diet. Suspect underlying OSA which likely aggravate difficulty losing weight.     Lelon Huh, MD  [facility]

## 2018-11-16 NOTE — Patient Instructions (Signed)
Please go to the lab draw station in [address] on the second floor of [facility]. Normal hours are 8:00am to 12:30pm and 1:30pm to 4:00pm Monday through Friday  . Please review the attached list of medications and notify my office if there are any errors.   . Please bring all of your medications to every appointment so we can make sure that our medication list is the same as yours.   

## 2018-11-18 DIAGNOSIS — Z0000 Encounter for general adult medical examination without abnormal findings: Secondary | ICD-10-CM | POA: Diagnosis not present

## 2018-11-19 LAB — COMPREHENSIVE METABOLIC PANEL
ALT: 22 IU/L (ref 0–44)
AST: 17 IU/L (ref 0–40)
Albumin/Globulin Ratio: 2 (ref 1.2–2.2)
Albumin: 4.5 g/dL (ref 4.1–5.2)
Alkaline Phosphatase: 49 IU/L (ref 39–117)
BILIRUBIN TOTAL: 0.5 mg/dL (ref 0.0–1.2)
BUN/Creatinine Ratio: 15 (ref 9–20)
BUN: 14 mg/dL (ref 6–20)
CHLORIDE: 103 mmol/L (ref 96–106)
CO2: 25 mmol/L (ref 20–29)
Calcium: 9.4 mg/dL (ref 8.7–10.2)
Creatinine, Ser: 0.91 mg/dL (ref 0.76–1.27)
GFR calc non Af Amer: 113 mL/min/{1.73_m2} (ref >59)
GFR, EST AFRICAN AMERICAN: 130 mL/min/{1.73_m2} (ref >59)
Globulin, Total: 2.2 g/dL (ref 1.5–4.5)
Glucose: 81 mg/dL (ref 65–99)
Potassium: 4.3 mmol/L (ref 3.5–5.2)
Sodium: 143 mmol/L (ref 134–144)
TOTAL PROTEIN: 6.7 g/dL (ref 6.0–8.5)

## 2018-11-19 LAB — LIPID PANEL
Chol/HDL Ratio: 4.8 ratio (ref 0.0–5.0)
Cholesterol, Total: 162 mg/dL (ref 100–199)
HDL: 34 mg/dL — ABNORMAL LOW (ref >39)
LDL Calculated: 93 mg/dL (ref 0–99)
Triglycerides: 176 mg/dL — ABNORMAL HIGH (ref 0–149)
VLDL Cholesterol Cal: 35 mg/dL (ref 5–40)

## 2018-11-21 ENCOUNTER — Telehealth: Payer: Self-pay

## 2018-11-21 NOTE — Telephone Encounter (Signed)
Pt advised.   Thanks,   -Salayah Meares  

## 2018-11-21 NOTE — Telephone Encounter (Signed)
LMTCB 11/21/2018  Thanks,   -Justyce Yeater  

## 2018-11-21 NOTE — Telephone Encounter (Signed)
-----   Message from Malva Limes, MD sent at 11/19/2018  6:38 PM EST ----- Labs are good. Cholesterol is 162. Check every 2-3 year.

## 2018-11-22 ENCOUNTER — Telehealth: Payer: Self-pay | Admitting: Family Medicine

## 2018-11-22 NOTE — Telephone Encounter (Signed)
Order faxed to [facility] for home sleep study

## 2018-11-28 ENCOUNTER — Telehealth: Payer: Self-pay | Admitting: Family Medicine

## 2018-11-28 DIAGNOSIS — N50811 Right testicular pain: Secondary | ICD-10-CM

## 2018-11-28 MED ORDER — DOXYCYCLINE HYCLATE 100 MG PO TABS: 100.0000 mg | ORAL_TABLET | Freq: Two times a day (BID) | ORAL | 0 refills | Status: DC

## 2018-11-28 NOTE — Telephone Encounter (Signed)
Pt called saying he is still having problems with his groin area.  He has taken a lot of antibiotics and it gets better but it is back and seems worse.  Pain in back now and groin area  CB#  [phone]  Thanks teri

## 2018-11-28 NOTE — Telephone Encounter (Signed)
Need to change antibiotic. Have sent prescription prescription for doxycycline to [facility].

## 2018-11-28 NOTE — Telephone Encounter (Signed)
Spoke to pt and advised that a referral has been sent to urology.  dbs

## 2018-11-28 NOTE — Telephone Encounter (Signed)
He needs to see urologist, have sent referral please advise patient

## 2018-11-28 NOTE — Telephone Encounter (Signed)
Pt is letting Dr. Sherrie Mustache know he was told by the urologist office will not be able to see him until next Wed.   Pt says he cannot wait that long.  Pt is consistently burning and having sharp pains.  He is worse than before. He is open to anywhere else that can see him sooner or something Dr. Sherrie Mustache can advise.  Please advise.  Thanks, [employer]

## 2018-11-28 NOTE — Telephone Encounter (Signed)
Please review

## 2018-11-28 NOTE — Telephone Encounter (Signed)
Please advise 

## 2018-11-29 NOTE — Telephone Encounter (Signed)
Tried calling patient. Left message to call back. 

## 2018-11-29 NOTE — Telephone Encounter (Signed)
LMTCB 11/29/2018  Thanks,   -Vernona Rieger

## 2018-11-29 NOTE — Telephone Encounter (Signed)
Pt returned call ° °teri °

## 2018-11-29 NOTE — Telephone Encounter (Signed)
Patient advised. Patient states he picked the prescription up and started it yesterday. He states today he feels much better. Patient was advised to keep appointment with Urology next week. Patient verbalized understanding.

## 2018-11-30 DIAGNOSIS — G4733 Obstructive sleep apnea (adult) (pediatric): Secondary | ICD-10-CM | POA: Diagnosis not present

## 2018-11-30 DIAGNOSIS — R0602 Shortness of breath: Secondary | ICD-10-CM | POA: Diagnosis not present

## 2018-11-30 LAB — PULMONARY FUNCTION TEST

## 2018-12-01 DIAGNOSIS — R0602 Shortness of breath: Secondary | ICD-10-CM | POA: Diagnosis not present

## 2018-12-01 DIAGNOSIS — G4733 Obstructive sleep apnea (adult) (pediatric): Secondary | ICD-10-CM | POA: Diagnosis not present

## 2018-12-06 ENCOUNTER — Telehealth: Payer: Self-pay | Admitting: Family Medicine

## 2018-12-06 DIAGNOSIS — G4733 Obstructive sleep apnea (adult) (pediatric): Secondary | ICD-10-CM

## 2018-12-06 NOTE — Telephone Encounter (Signed)
Please advise patient sleep study shows mild sleep apnea. This usually responds to weight loss and nasal sprays to help keep airways open. Alternative is CPAP machine.  Recommend he start using fluticasone nasal spray every night for a month and work on losing weight. Also helps to raise head when sleeping with pillows or raising head of bed with bricks.  Schedule follow up in 6-8 weeks. . May need CPAP if not improved at follow up

## 2018-12-07 ENCOUNTER — Encounter: Payer: Self-pay | Admitting: Urology

## 2018-12-07 ENCOUNTER — Ambulatory Visit (INDEPENDENT_AMBULATORY_CARE_PROVIDER_SITE_OTHER): Payer: 59 | Admitting: Urology

## 2018-12-07 VITALS — BP 138/83 | HR 92 | Ht 75.0 in | Wt 318.0 lb

## 2018-12-07 DIAGNOSIS — R102 Pelvic and perineal pain: Secondary | ICD-10-CM | POA: Diagnosis not present

## 2018-12-07 LAB — URINALYSIS, COMPLETE
BILIRUBIN UA: NEGATIVE
Glucose, UA: NEGATIVE
Ketones, UA: NEGATIVE
Leukocytes, UA: NEGATIVE
Nitrite, UA: NEGATIVE
Protein, UA: NEGATIVE
RBC, UA: NEGATIVE
Specific Gravity, UA: 1.025 (ref 1.005–1.030)
Urobilinogen, Ur: 0.2 mg/dL (ref 0.2–1.0)
pH, UA: 7 (ref 5.0–7.5)

## 2018-12-07 LAB — MICROSCOPIC EXAMINATION
Bacteria, UA: NONE SEEN
RBC, UA: NONE SEEN /hpf (ref 0–2)

## 2018-12-07 MED ORDER — TAMSULOSIN HCL 0.4 MG PO CAPS: 0.4000 mg | ORAL_CAPSULE | Freq: Every day | ORAL | 0 refills | Status: DC | PRN

## 2018-12-07 NOTE — Progress Notes (Signed)
12/07/2018 1:20 PM   Jesus Gardner [birth year] [id number]  Referring provider: Malva Limes, MD [address] [place], [place] [address]  Chief Complaint  Patient presents with  . Testicle Pain    HPI: 28 yo male with pain or pressure in right inguinal canal down to the testicle. Since September 2020, but looking back on and off for years just worse recently.  Scrotal UA Jan 2020 benign.  I reviewed all the images. He also had pain with sex and dysuria. Cipro helped, although he needed 3 rounds and discomfort returned. He then took doycycline. He still has pressure. No gross hematuria. He also has back pain. They've been trying to have kids for 6 mo. Ejaculation seems normal. Erections are good. He noted some color change. His wife is being evaluated and has irregular ovulation.   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   No GU hx or sx. He's always had issues with dysuria and drank a lot of soda. He was told long ago he had a left varicocele.   His UA is normal.   Modifying factors: There are no other modifying factors  Associated signs and symptoms: There are no other associated signs and symptoms Aggravating and relieving factors: There are no other aggravating or relieving factors Severity: Moderate Duration: Persistent    PMH: Past Medical History:  Diagnosis Date  . Costochondritis 12/30/2015  . Elevated transaminase level 03/12/2016  . History of chicken pox     Surgical History: Past Surgical History:  Procedure Laterality Date  . None      Home Medications:  Allergies as of 12/07/2018      Reactions   Pollen Extract       Medication List       Accurate as of December 07, 2018  1:20 PM. Always use your most recent med list.        ALLEGRA-D ALLERGY & CONGESTION 180-240 MG 24 hr tablet Generic drug:  fexofenadine-pseudoephedrine Take 1 tablet by mouth daily.   doxycycline 100 MG tablet Commonly known as:   VIBRA-TABS Take 1 tablet (100 mg total) by mouth 2 (two) times daily.   Fish Oil 1000 MG Caps Take 2 capsules by mouth 2 (two) times daily.   fluticasone 50 MCG/ACT nasal spray Commonly known as:  FLONASE Place 2 sprays into both nostrils daily.   multivitamin capsule Take 1 capsule by mouth daily.       Allergies:  Allergies  Allergen Reactions  . Pollen Extract     Family History: Family History  Problem Relation Age of Onset  . Non-Hodgkin's lymphoma Mother 30  . Cerebrovascular Disease Father   . Diabetes Father        type 2  . CAD Father     Social History:  reports that he has never smoked. He has never used smokeless tobacco. He reports current alcohol use. He reports that he does not use drugs.  ROS: UROLOGY Frequent Urination?: No Hard to postpone urination?: Yes Burning/pain with urination?: Yes Get up at night to urinate?: No Leakage of urine?: No Urine stream starts and stops?: No Trouble starting stream?: No Do you have to strain to urinate?: No Blood in urine?: No Urinary tract infection?: No Sexually transmitted disease?: No Injury to kidneys or bladder?: No Painful intercourse?: No Weak stream?: No Erection problems?: Yes Penile pain?: No  Gastrointestinal Nausea?: No Vomiting?: No Indigestion/heartburn?: No Diarrhea?: No Constipation?: No  Constitutional  Fever: No Night sweats?: No Weight loss?: No Fatigue?: No  Skin Skin rash/lesions?: No Itching?: No  Eyes Blurred vision?: No Double vision?: No  Ears/Nose/Throat Sore throat?: No Sinus problems?: No  Hematologic/Lymphatic Swollen glands?: No Easy bruising?: No  Cardiovascular Leg swelling?: No Chest pain?: No  Respiratory Cough?: No Shortness of breath?: No  Endocrine Excessive thirst?: No  Musculoskeletal Back pain?: Yes Joint pain?: No  Neurological Headaches?: No Dizziness?: No  Psychologic Depression?: No Anxiety?: No  Physical Exam: BP  138/83   Pulse 92   Ht 6\' 3"  (1.905 m)   Wt (!) 144.2 kg   BMI 39.75 kg/m   Constitutional:  Alert and oriented, No acute distress. HEENT: [place] AT, moist mucus membranes.  Trachea midline, no masses. Cardiovascular: No clubbing, cyanosis, or edema. Respiratory: Normal respiratory effort, no increased work of breathing. GI: Abdomen is soft, nontender, nondistended, no abdominal masses GU: No CVA tenderness Lymph: No cervical or inguinal lymphadenopathy. Skin: No rashes, bruises or suspicious lesions. Neurologic: Grossly intact, no focal deficits, moving all 4 extremities. Psychiatric: Normal mood and affect. GU: Penis circumcised and normal without mass or lesion.  Testicles were descended bilaterally and palpably normal.  I may have palpated a small left varicocele, but no visible. Pain the in the perineum, pelvic floor.    Laboratory Data: Lab Results  Component Value Date   WBC 6.2 05/17/2013   HGB 14.0 05/17/2013   HCT 42 05/17/2013   PLT 225 05/17/2013    Lab Results  Component Value Date   CREATININE 0.91 11/18/2018    No results found for: PSA  No results found for: TESTOSTERONE  No results found for: HGBA1C  Urinalysis No results found for: COLORURINE, APPEARANCEUR, LABSPEC, PHURINE, GLUCOSEU, HGBUR, BILIRUBINUR, KETONESUR, PROTEINUR, UROBILINOGEN, NITRITE, LEUKOCYTESUR  No results found for: LABMICR, WBCUA, RBCUA, LABEPIT, MUCUS, BACTERIA   No results found for this or any previous visit. No results found for this or any previous visit. No results found for this or any previous visit. No results found for this or any previous visit. No results found for this or any previous visit. No results found for this or any previous visit. No results found for this or any previous visit. No results found for this or any previous visit.  Assessment & Plan:    Pelvic pain - discussed warm baths, PT/OT, NSAIDs and alpha blockers. I sent a rx for tamsulosin. He is  getting a semen analysis at his wife's [facility]. Also, discussed importance of water intake.  Looking back his issues with a varicocele or soda intake may have been a manifestation of the same symptoms earlier in his life.  We did discuss imaging with CT scan as well as cystoscopy and he will consider.  His urinalysis is normal, but we will consider further work-up.  Also discussed timing of sex with his wife's ovulation and not to take alpha blockers 3 to 4 days prior to trying to conceive - discussed RGE.    No follow-ups on file.  Jerilee Field, MD  [facility] [address] [place], [place] [address] [phone]

## 2018-12-07 NOTE — Patient Instructions (Signed)
Pelvic Pain, Male  Pelvic pain is pain in your lower abdomen, below your belly button and between your hips. The pain may start suddenly (be acute), keep coming back (recur), or last a long time (become chronic). Pelvic pain that lasts longer than six months is considered chronic. There are many possible causes of pelvic pain. Sometimes, the cause is not known.  Pelvic pain may affect your:  · Prostate gland.  · Urinary system.  · Digestive tract.  · Musculoskeletal system. Strained muscles or ligaments may cause pelvic pain.  Follow these instructions at home:    Medicines  · Take over-the-counter and prescription medicines only as told by your health care provider.  · If you were prescribed an antibiotic medicine, take it as told by your health care provider. Do not stop taking the antibiotic even if you start to feel better.  Managing pain, stiffness, and swelling    · Take warm water baths (sitz baths). Sitz baths help with relaxing your pelvic floor muscles.  ? For a sitz bath, the water only comes up to your hips and covers your buttocks. A sitz bath may done at home in a bathtub or with a portable sitz bath that fits over the toilet.  · If directed, apply heat to the affected area before you exercise. Use the heat source that your health care provider recommends, such as a moist heat pack or a heating pad.  ? Place a towel between your skin and the heat source.  ? Leave the heat on for 20-30 minutes.  ? Remove the heat if your skin turns bright red. This is especially important if you are unable to feel pain, heat, or cold. You may have a greater risk of getting burned.  General instructions  · Rest as told by your health care provider.  · Keep a journal of your pelvic pain. Write down:  ? When the pain started.  ? Where the pain is located.  ? What seems to make the pain better or worse.  ? Any symptoms you have along with the pain.  · Follow your treatment plan as told by your health care provider. This may  include:  ? Pelvic physical therapy.  ? Yoga, meditation, and exercise.  ? Biofeedback. This process trains you to manage your body's response (physiological response) through breathing techniques and relaxation methods. You will work with a therapist while machines are used to monitor your physical symptoms.  ? Acupuncture. This is a type of treatment that involves stimulating specific points on your body by inserting thin needles through your skin to treat pain.  · Keep all follow-up visits as told by your health care provider. This is important.  Contact a health care provider if:  · Medicine does not help your pain.  · Your pain comes back.  · You have new symptoms.  · You have a fever or chills.  · You are constipated.  · You have blood in your urine or stool.  · You feel weak or light-headed.  Get help right away if:  · You have sudden severe pain.  · Your pain steadily gets worse.  · You have severe pain along with fever, nausea, vomiting, or excessive sweating.  Summary  · Pelvic pain is pain in your lower abdomen, below your belly button and between your hips. There are many possible causes of pelvic pain. Sometimes, the cause is not known.  · Take over-the-counter and prescription medicines only as told   by your health care provider. If you were prescribed an antibiotic medicine, take it as told by your health care provider. Do not stop taking the antibiotic even if you start to feel better.  · Contact a health care provider if you have new or worsening symptoms.  · Get help right away if you have severe pain along with fever, nausea, vomiting, or excessive sweating.  · Keep all follow-up visits as told by your health care provider. This is important.  This information is not intended to replace advice given to you by your health care provider. Make sure you discuss any questions you have with your health care provider.  Document Released: 06/22/2012 Document Revised: 02/16/2018 Document Reviewed:  02/16/2018  Elsevier Interactive Patient Education © 2019 [employer]vier Inc[employer]

## 2018-12-13 NOTE — Telephone Encounter (Signed)
Patient advised and verbally voiced understanding. Patient agrees with plan. Follow up appointment scheduled 01/24/2019 at 1:40pm.

## 2018-12-14 ENCOUNTER — Telehealth: Payer: Self-pay | Admitting: Urology

## 2018-12-14 NOTE — Telephone Encounter (Signed)
Pt called and states that he is having low back and hip pain he feels like it is worse when he drinks caffeine. Please advise.

## 2018-12-19 NOTE — Telephone Encounter (Signed)
Left pt mess to call, should follow up with PCP for this pain for further evaluation. If pain is more bladder/pelvic floor he may call us back to schedule CTscan and cysto per Dr. Estil Daft last note

## 2018-12-20 DIAGNOSIS — Z3141 Encounter for fertility testing: Secondary | ICD-10-CM | POA: Diagnosis not present

## 2019-01-27 ENCOUNTER — Other Ambulatory Visit: Payer: Self-pay

## 2019-01-27 ENCOUNTER — Encounter: Payer: Self-pay | Admitting: Family Medicine

## 2019-01-27 ENCOUNTER — Ambulatory Visit (INDEPENDENT_AMBULATORY_CARE_PROVIDER_SITE_OTHER): Payer: 59 | Admitting: Family Medicine

## 2019-01-27 VITALS — BP 132/82 | Temp 96.6°F | Wt 323.0 lb

## 2019-01-27 DIAGNOSIS — R0989 Other specified symptoms and signs involving the circulatory and respiratory systems: Secondary | ICD-10-CM

## 2019-01-27 DIAGNOSIS — J301 Allergic rhinitis due to pollen: Secondary | ICD-10-CM

## 2019-01-27 DIAGNOSIS — G4733 Obstructive sleep apnea (adult) (pediatric): Secondary | ICD-10-CM

## 2019-01-27 NOTE — Patient Instructions (Signed)
.   Continue using Flonase (fluticasone) nasal spray daily, but take a break for 1-2 weeks every three months   . You can try OTC Mucinex (guaifenesin) for chest congestion. Make sure it is the plain Mucinex, not a combination formulation (e.g. not Mucinex D)   If you feel like sleep apnea is getting worse, let me know and we can order CPAP titration

## 2019-01-27 NOTE — Progress Notes (Signed)
Patient: Jesus Gardner Male    DOB: 08/11/1991   28 y.o.   MRN: [id number] Visit Date: 01/27/2019  Today's Provider: Mila Merry, MD   Chief Complaint  Patient presents with  . Sleep Apnea    follow up visit   Subjective:    Virtual Visit via Video Note  I connected with Jesus Gardner on 01/27/19 at  1:40 PM EDT by a video enabled telemedicine application and verified that I am speaking with the correct person using two identifiers.   I discussed the limitations of evaluation and management by telemedicine and the availability of in person appointments. The patient expressed understanding and agreed to proceed.   HPI  Had home sleep study 12/01/2018 Mild AHI/RDI/ODI = 9.3/12.8/4.3  (mild ) on 12/01/2018 with symptoms of fatigue and daytime sleepiness. Was advised on mechanical treatments such as elevated head above bed, losing weight, and trying steroid nose sprays to help keep nasal breathing passages open. He states that he does feel well rested when he has opportunity to sleep through the night. Has been a little more congested in his chest since recovering from flu sx in January. Is also doing well using fluticasone daily for allergies.   Wt Readings from Last 3 Encounters:  01/27/19 (!) 323 lb (146.5 kg)  12/07/18 (!) 318 lb (144.2 kg)  11/16/18 (!) 326 lb (147.9 kg)     Allergies  Allergen Reactions  . Pollen Extract      Current Outpatient Medications:  .  cetirizine (ZYRTEC) 10 MG tablet, Take 10 mg by mouth daily., Disp: , Rfl:  .  fluticasone (FLONASE) 50 MCG/ACT nasal spray, Place 2 sprays into both nostrils daily., Disp: 16 g, Rfl: 5 .  Multiple Vitamin (MULTIVITAMIN) capsule, Take 1 capsule by mouth daily., Disp: , Rfl:  .  tamsulosin (FLOMAX) 0.4 MG CAPS capsule, Take 1 capsule (0.4 mg total) by mouth daily as needed (dysuria, weak stream)., Disp: 30 capsule, Rfl: 0 .  doxycycline (VIBRA-TABS) 100 MG tablet, Take 1 tablet (100 mg total) by mouth 2  (two) times daily. (Patient not taking: Reported on 01/27/2019), Disp: 20 tablet, Rfl: 0 .  fexofenadine-pseudoephedrine (ALLEGRA-D ALLERGY & CONGESTION) 180-240 MG 24 hr tablet, Take 1 tablet by mouth daily., Disp: , Rfl:  .  Omega-3 Fatty Acids (FISH OIL) 1000 MG CAPS, Take 2 capsules by mouth 2 (two) times daily., Disp: , Rfl:   Review of Systems  Constitutional: Negative for appetite change, chills and fever.  Respiratory: Negative for chest tightness, shortness of breath and wheezing.   Cardiovascular: Negative for chest pain and palpitations.  Gastrointestinal: Negative for abdominal pain, nausea and vomiting.    Social History   Tobacco Use  . Smoking status: Never Smoker  . Smokeless tobacco: Never Used  Substance Use Topics  . Alcohol use: Yes    Alcohol/week: 0.0 standard drinks    Comment: occasionally      Objective:   BP 132/82   Temp (!) 96.6 F (35.9 C) (Oral)   Wt (!) 323 lb (146.5 kg)   BMI 40.37 kg/m  (patient reported) Vitals:   01/27/19 1213  BP: 132/82  Temp: (!) 96.6 F (35.9 C)  TempSrc: Oral  Weight: (!) 323 lb (146.5 kg)     Physical Exam  General appearance: alert, well developed, well nourished, cooperative and in no distress Head: Normocephalic, without obvious abnormality, atraumatic Respiratory: Respirations even and unlabored, normal respiratory rate Extremities: No gross deformities  Skin: Skin color, texture, turgor normal. No rashes seen  Psych: Appropriate mood and affect. Neurologic: Mental status: Alert, oriented to person, place, and time, thought content appropriate.     Assessment & Plan    1. OSA (obstructive sleep apnea) Mild. Is doing well with raising head above bed, keeping nasal allergies under control with fluticasone, and continue to work on weight loss. Counseled that if he starts having more trouble with daytime sleepiness again we can do cpap study, otherwise continue current plan of care  2. Allergic rhinitis  due to pollen, unspecified seasonality Well controlled with nasal steroid  3. Chest congestion Try OTC guaifenesin       Mila Merryonald Fisher, MD  [facility]

## 2019-03-01 ENCOUNTER — Ambulatory Visit: Payer: Self-pay | Admitting: Urology

## 2019-03-11 ENCOUNTER — Encounter: Payer: Self-pay | Admitting: Family Medicine

## 2019-03-14 ENCOUNTER — Ambulatory Visit (INDEPENDENT_AMBULATORY_CARE_PROVIDER_SITE_OTHER): Payer: 59 | Admitting: Family Medicine

## 2019-03-14 ENCOUNTER — Other Ambulatory Visit: Payer: Self-pay

## 2019-03-14 ENCOUNTER — Encounter: Payer: Self-pay | Admitting: Family Medicine

## 2019-03-14 ENCOUNTER — Telehealth: Payer: Self-pay

## 2019-03-14 VITALS — BP 122/88 | HR 82 | Temp 99.3°F | Resp 16 | Wt 332.0 lb

## 2019-03-14 DIAGNOSIS — R1084 Generalized abdominal pain: Secondary | ICD-10-CM

## 2019-03-14 DIAGNOSIS — K921 Melena: Secondary | ICD-10-CM | POA: Diagnosis not present

## 2019-03-14 NOTE — Telephone Encounter (Signed)
Patient called office stating back in January he was diagnosed with prostatitis by his urologist and was started on Tamsulosin. Patient reports that he has had pain and discomfort in her lower abdomen due to this condtion and states that is was manageable on medication. Patient states for the past month though his pain in his lower abdomen has been radiating to right flank and back.m Patient states that on Saturday he had a few drinks and woke up Sunday with pressure again in his lower abdomen but states that this time pain remained a constant sharp pain and has since noticed blood in his stool. Patient states that he has a history of G.I related problems but states that this could mean something else. I advised patient that he needed to be seen by a physician or MD for further evaluation and treatment. Patient was placed on Dr. Esmond Camper scheduled today at [facility]. Renette Butters

## 2019-03-14 NOTE — Patient Instructions (Signed)
.   Please review the attached list of medications and notify my office if there are any errors.   . Please bring all of your medications to every appointment so we can make sure that our medication list is the same as yours.   

## 2019-03-14 NOTE — Progress Notes (Signed)
Patient: Jesus Gardner Male    DOB: 31-Jul-1991   28 y.o.   MRN: [id number] Visit Date: 03/14/2019  Today's Provider: Mila Merry, MD   Chief Complaint  Patient presents with  . Flank Pain    x 8 months   Subjective:     Flank Pain  This is a recurrent problem. The problem occurs intermittently. The problem has been waxing and waning since onset. The pain is present in the lumbar spine (also on RUQ of abdomen). Quality: pressure. Pertinent negatives include no abdominal pain, chest pain or fever.  He has had recurrent flank and inguinal pain for several months which had been consistently improving with tamsulosin. However, had a much more severe pain onset about 3 days ago mostly on right flank and abdomen that did not resolve with tamsulosin. It is unrelated to position or meals. Pain is lower than previous flank pain. Started having large amount of bright red per rectum and diarrhea soon after-words, and lasting until yesterday. Has had no bm today. No fevers, chills or sweats. No rectal pain or masses.   He states he is concerned since his mother had NH lymphoma diagnosed at age 33, and had many non-specific sx for many months before lymphoma was diagnosed.   Allergies  Allergen Reactions  . Pollen Extract      Current Outpatient Medications:  .  cetirizine (ZYRTEC) 10 MG tablet, Take 10 mg by mouth daily., Disp: , Rfl:  .  fexofenadine-pseudoephedrine (ALLEGRA-D ALLERGY & CONGESTION) 180-240 MG 24 hr tablet, Take 1 tablet by mouth daily., Disp: , Rfl:  .  fluticasone (FLONASE) 50 MCG/ACT nasal spray, Place 2 sprays into both nostrils daily., Disp: 16 g, Rfl: 5 .  Multiple Vitamin (MULTIVITAMIN) capsule, Take 1 capsule by mouth daily., Disp: , Rfl:  .  Omega-3 Fatty Acids (FISH OIL) 1000 MG CAPS, Take 2 capsules by mouth 2 (two) times daily., Disp: , Rfl:  .  tamsulosin (FLOMAX) 0.4 MG CAPS capsule, Take 1 capsule (0.4 mg total) by mouth daily as needed (dysuria, weak  stream)., Disp: 30 capsule, Rfl: 0  Review of Systems  Constitutional: Negative for appetite change, chills, diaphoresis, fatigue and fever.  Respiratory: Negative for chest tightness, shortness of breath and wheezing.   Cardiovascular: Negative for chest pain and palpitations.  Gastrointestinal: Positive for blood in stool (had 2-3 episodes of bright red blood in stool over the past week; one episode had blood clots) and diarrhea. Negative for abdominal pain, nausea and vomiting.       Acid reflux  Genitourinary: Positive for flank pain.  Musculoskeletal: Positive for back pain.  Neurological: Negative for dizziness and light-headedness.    Social History   Tobacco Use  . Smoking status: Never Smoker  . Smokeless tobacco: Never Used  Substance Use Topics  . Alcohol use: Yes    Alcohol/week: 0.0 standard drinks    Comment: occasionally      Objective:   BP 122/88 (BP Location: Left Arm, Patient Position: Sitting, Cuff Size: Large)   Pulse 82   Temp 99.3 F (37.4 C) (Oral)   Resp 16   Wt (!) 332 lb (150.6 kg)   SpO2 99% Comment: room air  BMI 41.50 kg/m  Vitals:   03/14/19 1357  BP: 122/88  Pulse: 82  Resp: 16  Temp: 99.3 F (37.4 C)  TempSrc: Oral  SpO2: 99%  Weight: (!) 332 lb (150.6 kg)     Physical Exam  General Appearance:    Alert, cooperative, no distress, morbidly obese  Eyes:    PERRL, conjunctiva/corneas clear, EOM's intact       Lungs:     Clear to auscultation bilaterally, respirations unlabored  Heart:    Regular rate and rhythm  Abdomen:   Mildly tender right CVA, flank and right mid and lower abdomen. No masses palpated. Exam limited by obesity.   Neurologic:   Awake, alert, oriented x 3. No apparent focal neurological           defect.         Hemoccult weakly positive.  Assessment & Plan    1. Generalized abdominal pain Progression of sx over several months. Need abdominal imaging.  - CT Abdomen Pelvis W Contrast; Future  2. Blood in  stool  - CT Abdomen Pelvis W Contrast; Future     Mila Merryonald Shay Jhaveri, MD  [facility]

## 2019-03-14 NOTE — Telephone Encounter (Signed)
O.v. scheduled 03/14/2019

## 2019-03-15 ENCOUNTER — Telehealth: Payer: Self-pay

## 2019-03-15 NOTE — Telephone Encounter (Signed)
FYI: Patient called wanting to let Dr. Sherrie Mustache know that he had another episode of rectal bleeding yesterday evening after a BM. Patient still waiting for CT to be scheduled. Patient is getting anxious.  Sarah please schedule.

## 2019-03-17 ENCOUNTER — Telehealth (INDEPENDENT_AMBULATORY_CARE_PROVIDER_SITE_OTHER): Payer: 59 | Admitting: Urology

## 2019-03-17 ENCOUNTER — Other Ambulatory Visit: Payer: Self-pay

## 2019-03-17 DIAGNOSIS — R1030 Lower abdominal pain, unspecified: Secondary | ICD-10-CM

## 2019-03-17 DIAGNOSIS — R868 Other abnormal findings in specimens from male genital organs: Secondary | ICD-10-CM

## 2019-03-17 NOTE — Progress Notes (Signed)
03/17/2019 9:37 AM   Gillian Shields 08-Jan-1991 [id number]  Referring provider: Malva Limes, MD [address] [place], [place] [address]  No chief complaint on file.   HPI:  28 yo male with pain or pressure in right inguinal canal down to the testicle. Since September 2019, but looking back on and off for years just worse recently.  Scrotal UA Jan 2020 benign.  I reviewed all the images. He also had pain with sex and dysuria. Cipro helped, although he needed 3 rounds and discomfort returned. He then took doycycline. He still has pressure. No gross hematuria. He also has back pain. They've been trying to have kids for 6 mo. Ejaculation seems normal. Erections are good. He noted some color change. His wife is being evaluated and has irregular ovulation.   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   No GU hx or sx. He's always had issues with dysuria and drank a lot of soda. He was told long ago he had a left varicocele.   He tried tamsulosin, warm baths, and NSAIDs. His pain improved. Every once in a while he has pain but "manageable". One tamsulosin helps. Intermittent. "Not bad". He also has abd pain, hip pain and back pain. He has a CT pending March 21, 2019. Not pregnant yet. His semen analysis was "fine". A little "sluggish". Discussed supplements and vitamins.   PMH: Past Medical History:  Diagnosis Date  . Costochondritis 12/30/2015  . Elevated transaminase level 03/12/2016  . History of chicken pox     Surgical History: Past Surgical History:  Procedure Laterality Date  . None      Home Medications:  Allergies as of 03/17/2019      Reactions   Pollen Extract       Medication List       Accurate as of March 17, 2019  9:37 AM. If you have any questions, ask your nurse or doctor.        Allegra-D Allergy & Congestion 180-240 MG 24 hr tablet Generic drug:  fexofenadine-pseudoephedrine Take 1 tablet by mouth daily.   cetirizine 10 MG  tablet Commonly known as:  ZYRTEC Take 10 mg by mouth daily.   Fish Oil 1000 MG Caps Take 2 capsules by mouth 2 (two) times daily.   fluticasone 50 MCG/ACT nasal spray Commonly known as:  FLONASE Place 2 sprays into both nostrils daily.   multivitamin capsule Take 1 capsule by mouth daily.   tamsulosin 0.4 MG Caps capsule Commonly known as:  FLOMAX Take 1 capsule (0.4 mg total) by mouth daily as needed (dysuria, weak stream).       Allergies:  Allergies  Allergen Reactions  . Pollen Extract     Family History: Family History  Problem Relation Age of Onset  . Non-Hodgkin's lymphoma Mother 87  . Cerebrovascular Disease Father   . Diabetes Father        type 2  . CAD Father     Social History:  reports that he has never smoked. He has never used smokeless tobacco. He reports current alcohol use. He reports that he does not use drugs.  ROS:                                        12 system ROS obtained  He gets occasional CP with allergies and inflammation (flu  in Jan)- not exertional Abd pain, diarrhea, hemorrhoids - one bled and stopped  Hip pain since high school  Otherwise negative   Physical Exam: There were no vitals taken for this visit.  Constitutional:  Alert and oriented, No acute distress. HEENT: [place] AT, moist mucus membranes.  Trachea midline, no masses. Cardiovascular: No clubbing, cyanosis, or edema. Respiratory: Normal respiratory effort, no increased work of breathing. GI: Abdomen is soft, nontender, nondistended, no abdominal masses GU: No CVA tenderness Lymph: No cervical or inguinal lymphadenopathy. Skin: No rashes, bruises or suspicious lesions. Neurologic: Grossly intact, no focal deficits, moving all 4 extremities. Psychiatric: Normal mood and affect.  Laboratory Data: Lab Results  Component Value Date   WBC 6.2 05/17/2013   HGB 14.0 05/17/2013   HCT 42 05/17/2013   PLT 225 05/17/2013    Lab Results   Component Value Date   CREATININE 0.91 11/18/2018    No results found for: PSA  No results found for: TESTOSTERONE  No results found for: HGBA1C  Urinalysis    Component Value Date/Time   APPEARANCEUR Clear 12/07/2018 1400   GLUCOSEU Negative 12/07/2018 1400   BILIRUBINUR Negative 12/07/2018 1400   PROTEINUR Negative 12/07/2018 1400   NITRITE Negative 12/07/2018 1400   LEUKOCYTESUR Negative 12/07/2018 1400    Lab Results  Component Value Date   LABMICR See below: 12/07/2018   WBCUA 0-5 12/07/2018   RBCUA None seen 12/07/2018   LABEPIT 0-10 12/07/2018   BACTERIA None seen 12/07/2018    Pertinent Imaging:  No results found for this or any previous visit. No results found for this or any previous visit. No results found for this or any previous visit. No results found for this or any previous visit. No results found for this or any previous visit. No results found for this or any previous visit. No results found for this or any previous visit. No results found for this or any previous visit.  This patient encounter is appropriate and reasonable under the circumstances given the patient's particular presentation at this time. The patient has been advised of the potential risks and limitations of this mode of treatment (including, but not limited to, the absence of in-person examination) and has agreed to be treated in a remote fashion in spite of them.  Any and all of the patient's/patient's family's questions on this issue have been answered, and I have made no promises or guarantees to the patient. The patient has also been advised to contact this office for worsening conditions or problems, and seek emergency medical treatment and/or call 911 if the patient deems either necessary.  Assessment & Plan:    Decreased motility - we discussed supplements   Inguinal pain - doing well with PRN tamsulosin. He knows not to take when trying to conceive. F/u to review CT scan.    No follow-ups on file.  Jerilee FieldMatthew Zanylah Hardie, MD  [facility] [address], [address] [place], [place] [address] [phone]

## 2019-03-21 ENCOUNTER — Other Ambulatory Visit: Payer: Self-pay

## 2019-03-21 ENCOUNTER — Ambulatory Visit
Admission: RE | Admit: 2019-03-21 | Discharge: 2019-03-21 | Disposition: A | Discharge status: Home / Self Care | Payer: 59 | Source: Ambulatory Visit | Attending: Family Medicine | Admitting: Family Medicine

## 2019-03-21 DIAGNOSIS — R109 Unspecified abdominal pain: Secondary | ICD-10-CM | POA: Diagnosis not present

## 2019-03-21 DIAGNOSIS — K921 Melena: Secondary | ICD-10-CM | POA: Diagnosis not present

## 2019-03-21 DIAGNOSIS — R1084 Generalized abdominal pain: Secondary | ICD-10-CM | POA: Diagnosis not present

## 2019-03-21 DIAGNOSIS — K5790 Diverticulosis of intestine, part unspecified, without perforation or abscess without bleeding: Secondary | ICD-10-CM | POA: Insufficient documentation

## 2019-03-21 DIAGNOSIS — R110 Nausea: Secondary | ICD-10-CM | POA: Diagnosis not present

## 2019-03-21 MED ORDER — IOHEXOL 300 MG/ML  SOLN
150.0000 mL | Freq: Once | INTRAMUSCULAR | Status: AC | PRN
  Administered 2019-03-21: 125 mL via INTRAVENOUS

## 2019-03-22 ENCOUNTER — Telehealth: Payer: Self-pay

## 2019-03-22 ENCOUNTER — Encounter: Payer: Self-pay | Admitting: Family Medicine

## 2019-03-22 NOTE — Telephone Encounter (Signed)
Pt advised.   Thanks,   -Laura  

## 2019-03-22 NOTE — Telephone Encounter (Signed)
Pat calling that he would like a visit or e-visit to go over the results because he has a lot of questions for the doctor. Patient was advised that Dr.Fisher was not in tomorrow until Friday and he wants to know if someone can call him to let him know please.

## 2019-03-22 NOTE — Telephone Encounter (Signed)
LMTCB 03/22/2019   Thanks,    -Almando Brawley  

## 2019-03-22 NOTE — Telephone Encounter (Signed)
Can you work him in on Friday?   Thanks,   -Mickel Baas

## 2019-03-22 NOTE — Telephone Encounter (Signed)
-----   Message from Birdie Sons, MD sent at 03/22/2019  8:03 AM EDT ----- CT does not show any sign of lymphoma. He does have diverticulosis, which could flare up from time to time and cause lower abdominal pains (diverticulitis). He should take OTC metamucil and drink several glasses of clear liquids, (mainly water) every day to prevent diverticulitis.

## 2019-03-23 NOTE — Telephone Encounter (Signed)
Could do an e-visit at 1:20, that's the only time I have.

## 2019-03-24 NOTE — Telephone Encounter (Signed)
LMTCB 03/24/2019   Thanks,   -Jesus Gardner 

## 2019-03-24 NOTE — Telephone Encounter (Signed)
LMTCB 03/24/2019   Thanks,   -Armand Preast 

## 2019-03-25 ENCOUNTER — Other Ambulatory Visit: Payer: Self-pay

## 2019-03-25 DIAGNOSIS — Z79899 Other long term (current) drug therapy: Secondary | ICD-10-CM | POA: Diagnosis not present

## 2019-03-25 DIAGNOSIS — M5412 Radiculopathy, cervical region: Secondary | ICD-10-CM | POA: Diagnosis not present

## 2019-03-25 DIAGNOSIS — M79602 Pain in left arm: Secondary | ICD-10-CM | POA: Diagnosis not present

## 2019-03-25 DIAGNOSIS — R51 Headache: Secondary | ICD-10-CM | POA: Diagnosis not present

## 2019-03-26 ENCOUNTER — Emergency Department: Payer: 59

## 2019-03-26 ENCOUNTER — Emergency Department
Admission: EM | Admit: 2019-03-26 | Discharge: 2019-03-26 | Disposition: A | Discharge status: Home / Self Care | Payer: 59 | Attending: Emergency Medicine | Admitting: Emergency Medicine

## 2019-03-26 DIAGNOSIS — Z79899 Other long term (current) drug therapy: Secondary | ICD-10-CM | POA: Diagnosis not present

## 2019-03-26 DIAGNOSIS — M5412 Radiculopathy, cervical region: Secondary | ICD-10-CM | POA: Diagnosis not present

## 2019-03-26 DIAGNOSIS — R51 Headache: Secondary | ICD-10-CM | POA: Diagnosis not present

## 2019-03-26 DIAGNOSIS — M79602 Pain in left arm: Secondary | ICD-10-CM | POA: Diagnosis not present

## 2019-03-26 LAB — CBC
HCT: 43.1 % (ref 39.0–52.0)
Hemoglobin: 14.1 g/dL (ref 13.0–17.0)
MCH: 28.3 pg (ref 26.0–34.0)
MCHC: 32.7 g/dL (ref 30.0–36.0)
MCV: 86.4 fL (ref 80.0–100.0)
Platelets: 241 10*3/uL (ref 150–400)
RBC: 4.99 MIL/uL (ref 4.22–5.81)
RDW: 13.4 % (ref 11.5–15.5)
WBC: 8.1 10*3/uL (ref 4.0–10.5)
nRBC: 0 % (ref 0.0–0.2)

## 2019-03-26 LAB — BASIC METABOLIC PANEL
Anion gap: 10 (ref 5–15)
BUN: 12 mg/dL (ref 6–20)
CO2: 28 mmol/L (ref 22–32)
Calcium: 9.6 mg/dL (ref 8.9–10.3)
Chloride: 102 mmol/L (ref 98–111)
Creatinine, Ser: 0.9 mg/dL (ref 0.61–1.24)
GFR calc Af Amer: >60 mL/min (ref >60)
GFR calc non Af Amer: >60 mL/min (ref >60)
Glucose, Bld: 102 mg/dL — ABNORMAL HIGH (ref 70–99)
Potassium: 4.2 mmol/L (ref 3.5–5.1)
Sodium: 140 mmol/L (ref 135–145)

## 2019-03-26 LAB — TROPONIN I: Troponin I: <0.03 ng/mL (ref <0.03)

## 2019-03-26 MED ORDER — SODIUM CHLORIDE 0.9% FLUSH: 3.0000 mL | Freq: Once | INTRAVENOUS | Status: DC

## 2019-03-26 NOTE — ED Triage Notes (Signed)
Patient c/o left arm pain/tingling radiating to left face today. Patient reports similar episode on Wednesday. Patient is tender to epigastric region. Patient denies chest pain, SOB. Patient reports nausea that has since resolved.

## 2019-03-26 NOTE — ED Provider Notes (Signed)
[facility] Emergency Department Provider Note ____________________________________________   First MD Initiated Contact with Patient 03/26/19 0254     (approximate)  I have reviewed the triage vital signs and the nursing notes.   HISTORY  Chief Complaint Arm Pain    HPI Jesus Gardner is a 28 y.o. male with PMH as noted below who presents with left arm tingling, acute onset this evening, radiating to his left shoulder and the left side of his face, and associated with some twitching in the left arm.  The patient denies chest pain or shortness of breath.  He states that he did feel some nausea at the time but this has resolved, he states that while he was waiting to be seen in the ED the tingling resolved as well.  He reports a similar but less severe episode on Wednesday that resolved on its own.  The patient states he has a history of unilateral left-sided headaches associated with nausea and photophobia but he has not been formally diagnosed with migraines.  However, he states he did not have this headache today.  He states his symptoms today began after he took omeprazole for GERD, which she recently started taking.  Past Medical History:  Diagnosis Date  . Costochondritis 12/30/2015  . Elevated transaminase level 03/12/2016  . History of chicken pox     Patient Active Problem List   Diagnosis Date Noted  . Diverticulosis 03/21/2019  . OSA (obstructive sleep apnea) 12/06/2018  . Elevated transaminase level 03/12/2016  . Allergic rhinitis 12/30/2015  . Arthralgia 12/30/2015  . Myalgia 12/30/2015  . Palpitations 12/30/2015  . Headache 12/30/2015  . Motion sickness 12/30/2015  . Obesity 12/30/2015  . Hyperlipidemia, mixed 03/06/2009  . Fam hx-ischem heart disease 10/12/1998    Past Surgical History:  Procedure Laterality Date  . None      Prior to Admission medications   Medication Sig Start Date End Date Taking? Authorizing Provider   cetirizine (ZYRTEC) 10 MG tablet Take 10 mg by mouth daily.    [provider]  fexofenadine-pseudoephedrine (ALLEGRA-D ALLERGY & CONGESTION) 180-240 MG 24 hr tablet Take 1 tablet by mouth daily.    [provider]  fluticasone (FLONASE) 50 MCG/ACT nasal spray Place 2 sprays into both nostrils daily. 02/24/18   Malva LimesFisher, Donald E, MD  Multiple Vitamin (MULTIVITAMIN) capsule Take 1 capsule by mouth daily.    [provider]  Omega-3 Fatty Acids (FISH OIL) 1000 MG CAPS Take 2 capsules by mouth 2 (two) times daily. 05/07/10   [provider]  tamsulosin (FLOMAX) 0.4 MG CAPS capsule Take 1 capsule (0.4 mg total) by mouth daily as needed (dysuria, weak stream). 12/07/18   Jerilee FieldEskridge, Matthew, MD    Allergies Pollen extract  Family History  Problem Relation Age of Onset  . Non-Hodgkin's lymphoma Mother [id number]  . Cerebrovascular Disease Father   . Diabetes Father        type 2  . CAD Father     Social History Social History   Tobacco Use  . Smoking status: Never Smoker  . Smokeless tobacco: Never Used  Substance Use Topics  . Alcohol use: Yes    Alcohol/week: 0.0 standard drinks    Comment: occasionally  . Drug use: No    Review of Systems  Constitutional: No fever. Eyes: No visual changes. ENT: No sore throat. Cardiovascular: Denies chest pain. Respiratory: Denies shortness of breath. Gastrointestinal: No vomiting. Genitourinary: Negative for flank pain.  Musculoskeletal: Negative for  back pain. Skin: Negative for rash. Neurological: Negative for headaches, focal weakness or numbness.   ____________________________________________   PHYSICAL EXAM:  VITAL SIGNS: ED Triage Vitals [03/26/19 0011]  Enc Vitals Group     BP 128/79     Pulse Rate 76     Resp 19     Temp 98.2 F (36.8 C)     Temp src      SpO2 97 %     Weight (!) 324 lb (147 kg)     Height 6\' 3"  (1.905 m)     Head Circumference      Peak Flow      Pain Score 2     Pain  Loc      Pain Edu?      Excl. in GC?     Constitutional: Alert and oriented. Well appearing and in no acute distress. Eyes: Conjunctivae are normal.  Head: Atraumatic. Nose: No congestion/rhinnorhea. Mouth/Throat: Mucous membranes are moist.   Neck: Normal range of motion.  Cardiovascular: Normal rate, regular rhythm. Grossly normal heart sounds.  Good peripheral circulation. Respiratory: Normal respiratory effort.  No retractions. Lungs CTAB. Gastrointestinal: No distention.  Musculoskeletal: No lower extremity edema.  Extremities warm and well perfused.  Neurologic:  Normal speech and language.  No facial droop.  Cranial nerves III through XII intact.  5/5 motor strength and intact sensation of bilateral upper and lower extremities.   Skin:  Skin is warm and dry. No rash noted. Psychiatric: Mood and affect are normal. Speech and behavior are normal.  ____________________________________________   LABS (all labs ordered are listed, but only abnormal results are displayed)  Labs Reviewed  BASIC METABOLIC PANEL - Abnormal; Notable for the following components:      Result Value   Glucose, Bld 102 (*)    All other components within normal limits  CBC  TROPONIN I   ____________________________________________  EKG  ED ECG REPORT I, Dionne BucySebastian Wisdom Rickey, the attending physician, personally viewed and interpreted this ECG.  Date: 03/26/2019 EKG Time: 2354 Rate: 78 Rhythm: normal sinus rhythm QRS Axis: normal Intervals: normal ST/T Wave abnormalities: normal Narrative Interpretation: no evidence of acute ischemia  ____________________________________________  RADIOLOGY  CXR: No focal infiltrate or other acute abnormality  ____________________________________________   PROCEDURES  Procedure(s) performed: No  Procedures  Critical Care performed: No ____________________________________________   INITIAL IMPRESSION / ASSESSMENT AND PLAN / ED COURSE  Pertinent  labs & imaging results that were available during my care of the patient were reviewed by me and considered in my medical decision making (see chart for details).  28 year old male with PMH as noted above presents with acute onset of left arm tingling radiating to the left shoulder and up to the face.  He had some nausea but no associated headache, weakness or numbness, or any chest pain or difficulty breathing.  The patient reports a milder episode of this a few days ago.  He associates the symptoms with omeprazole which he started this week for the first time.  On exam the patient is well-appearing.  His vital signs are normal.  The physical exam including neuro exam is within normal limits.  EKG is nonischemic.  Overall presentation is most consistent with radiculopathy, versus possible atypical migraine.  The patient has a history of headaches which sound like migraines although he has not been formally diagnosed.  Chest x-ray and labs including troponin are all within normal limits.  At this time, given that the patient is asymptomatic with  negative work-up, he is stable for discharge home.  I counseled him on the results of the work-up and the likely etiologies of his symptoms.  Return precautions given, and he expressed understanding.  ____________________________________________   FINAL CLINICAL IMPRESSION(S) / ED DIAGNOSES  Final diagnoses:  Cervical radiculopathy      NEW MEDICATIONS STARTED DURING THIS VISIT:  Discharge Medication List as of 03/26/2019  3:39 AM       Note:  This document was prepared using Dragon voice recognition software and may include unintentional dictation errors.    Arta Silence, MD 03/26/19 [phone]

## 2019-03-26 NOTE — ED Notes (Signed)
Pt sitting in chair in lobby in no acute distress.

## 2019-03-26 NOTE — ED Notes (Signed)
ED Provider at bedside. 

## 2019-03-26 NOTE — ED Notes (Signed)
Pt updated on delay. Pt verbalizes understanding.  

## 2019-03-26 NOTE — Discharge Instructions (Addendum)
Your symptoms are most likely related to radiculopathy, or possibly an atypical migraine.  They could also be a side effect of medication although this is less likely.  You should follow-up with your regular doctor.  Return to the ER for new, worsening, or persistent pain, weakness or numbness, severe headache, vision changes, difficulty using the arm, or any other new or worsening symptoms that concern you.

## 2019-03-27 ENCOUNTER — Telehealth: Payer: Self-pay

## 2019-03-27 NOTE — Telephone Encounter (Signed)
Patient is requesting a call back to discuss recent ER visit and also to discuss diverticulitis issues. CB#[phone]

## 2019-03-28 NOTE — Telephone Encounter (Signed)
LMTCB 03/28/2019  Thanks,   -Jaedan Huttner  

## 2019-03-28 NOTE — Telephone Encounter (Signed)
Follow up apt made for 03/29/2019 at 8am.  Thanks,   -Mickel Baas

## 2019-03-29 ENCOUNTER — Ambulatory Visit (INDEPENDENT_AMBULATORY_CARE_PROVIDER_SITE_OTHER): Payer: 59 | Admitting: Family Medicine

## 2019-03-29 ENCOUNTER — Encounter: Payer: Self-pay | Admitting: Family Medicine

## 2019-03-29 ENCOUNTER — Ambulatory Visit
Admission: RE | Admit: 2019-03-29 | Discharge: 2019-03-29 | Disposition: A | Discharge status: Home / Self Care | Payer: 59 | Source: Ambulatory Visit | Attending: Family Medicine | Admitting: Family Medicine

## 2019-03-29 ENCOUNTER — Other Ambulatory Visit: Payer: Self-pay | Admitting: Urology

## 2019-03-29 ENCOUNTER — Other Ambulatory Visit: Payer: Self-pay

## 2019-03-29 ENCOUNTER — Telehealth (INDEPENDENT_AMBULATORY_CARE_PROVIDER_SITE_OTHER): Payer: 59 | Admitting: Urology

## 2019-03-29 VITALS — BP 114/78 | HR 80 | Temp 98.3°F | Resp 16 | Wt 328.0 lb

## 2019-03-29 DIAGNOSIS — M542 Cervicalgia: Secondary | ICD-10-CM | POA: Diagnosis not present

## 2019-03-29 DIAGNOSIS — N50811 Right testicular pain: Secondary | ICD-10-CM

## 2019-03-29 DIAGNOSIS — M5410 Radiculopathy, site unspecified: Secondary | ICD-10-CM

## 2019-03-29 DIAGNOSIS — G8929 Other chronic pain: Secondary | ICD-10-CM

## 2019-03-29 DIAGNOSIS — R1031 Right lower quadrant pain: Secondary | ICD-10-CM | POA: Diagnosis not present

## 2019-03-29 DIAGNOSIS — K921 Melena: Secondary | ICD-10-CM

## 2019-03-29 DIAGNOSIS — K5790 Diverticulosis of intestine, part unspecified, without perforation or abscess without bleeding: Secondary | ICD-10-CM

## 2019-03-29 MED ORDER — PREDNISONE 10 MG PO TABS: ORAL_TABLET | ORAL | 0 refills | Status: AC

## 2019-03-29 NOTE — Patient Instructions (Signed)
Go to the [facility] on [facility] for [employer]  . Please review the attached list of medications and notify my office if there are any errors.   . Please bring all of your medications to every appointment so we can make sure that our medication list is the same as yours.

## 2019-03-29 NOTE — Progress Notes (Signed)
Virtual Visit via Video Note  I connected with Jesus Gardner on 03/29/19 at  4:00 PM EDT by a video enabled telemedicine application and verified that I am speaking with the correct person using two identifiers.  Location: Patient: at home/office Provider: in office    I discussed the limitations of evaluation and management by telemedicine and the availability of in person appointments. The patient expressed understanding and agreed to proceed.  History of Present Illness:  28 yo male with pain or pressure in right inguinal canal down to the testicle. Since September 2019, but looking back on and off for years just worse recently. Scrotal UA Jan 2020 benign. I reviewed all the images. He also had pain with sex and dysuria. Cipro helped, although he needed 3 roundsand discomfort returned. He then took doycycline. He still has pressure. No gross hematuria. He also has back pain. They've been trying to have kids for 6 mo. Ejaculation seems normal. Erections are good. He noted some color change.His wife is being evaluated and has irregular ovulation.   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  No GU hx or sx. He's always had issues with dysuria and drank a lot of soda. He was told long ago he had a left varicocele.  He tried tamsulosin, warm baths, and NSAIDs. His pain improved. Every once in a while he has pain but "manageable". One tamsulosin helps. Intermittent. "Not bad". He also has abd pain, hip pain and back pain. Not pregnant yet. His semen analysis was "fine". A little "sluggish". Discussed supplements and vitamins.   He underwent CT A/P 03/21/2019 which was normal. Normal GU tract. Prostate and SV's normal. No hernia. They have IUI coming up. Groin pain better. On abx for diverticulosis.     PMH: Past Medical History:  Diagnosis Date  . Costochondritis 12/30/2015  . Elevated transaminase level 03/12/2016  . History of chicken pox     Surgical History:  Past Surgical History:  Procedure Laterality Date  . None      Home Medications:  Allergies as of 03/29/2019      Reactions   Pollen Extract       Medication List       Accurate as of March 29, 2019  1:54 PM. If you have any questions, ask your nurse or doctor.        Allegra-D Allergy & Congestion 180-240 MG 24 hr tablet Generic drug: fexofenadine-pseudoephedrine Take 1 tablet by mouth daily.   cetirizine 10 MG tablet Commonly known as: ZYRTEC Take 10 mg by mouth daily.   Fish Oil 1000 MG Caps Take 2 capsules by mouth 2 (two) times daily.   fluticasone 50 MCG/ACT nasal spray Commonly known as: FLONASE Place 2 sprays into both nostrils daily.   multivitamin capsule Take 1 capsule by mouth daily.   omeprazole 20 MG capsule Commonly known as: PRILOSEC Take 20 mg by mouth daily.   predniSONE 10 MG tablet Commonly known as: DELTASONE 6 tablets for 1 day, then 5 for 1 day, then 4 for 1 day, then 3 for 1 day, then 2 for 1 day then 1 for 1 day. Started by: Mila Merryonald Fisher, MD   tamsulosin 0.4 MG Caps capsule Commonly known as: FLOMAX Take 1 capsule (0.4 mg total) by mouth daily as needed (dysuria, weak stream).       Allergies:  Allergies  Allergen Reactions  . Pollen Extract     Family History: Family History  Problem  Relation Age of Onset  . Non-Hodgkin's lymphoma Mother 81  . Cerebrovascular Disease Father   . Diabetes Father        type 2  . CAD Father     Social History:  reports that he has never smoked. He has never used smokeless tobacco. He reports current alcohol use. He reports that he does not use drugs.  Physical Exam: There were no vitals taken for this visit.  Constitutional:  Alert and oriented, No acute distress. HEENT: [place] AT Respiratory: Normal respiratory effort, no increased work of breathing. Skin: No rashes, bruises or suspicious lesions. Neurologic: Grossly intact, no focal deficits, moving upper extremities, sitting   Psychiatric: Normal mood and affect.  Laboratory Data: Lab Results  Component Value Date   WBC 8.1 03/26/2019   HGB 14.1 03/26/2019   HCT 43.1 03/26/2019   MCV 86.4 03/26/2019   PLT 241 03/26/2019    Lab Results  Component Value Date   CREATININE 0.90 03/26/2019    No results found for: PSA  No results found for: TESTOSTERONE  No results found for: HGBA1C  Urinalysis    Component Value Date/Time   APPEARANCEUR Clear 12/07/2018 1400   GLUCOSEU Negative 12/07/2018 1400   BILIRUBINUR Negative 12/07/2018 1400   PROTEINUR Negative 12/07/2018 1400   NITRITE Negative 12/07/2018 1400   LEUKOCYTESUR Negative 12/07/2018 1400    Lab Results  Component Value Date   LABMICR See below: 12/07/2018   WBCUA 0-5 12/07/2018   RBCUA None seen 12/07/2018   LABEPIT 0-10 12/07/2018   BACTERIA None seen 12/07/2018    Pertinent Imaging: CT A/P    Assessment and Plan:   Follow Up Instructions: PRN  Issues with the colon can cause groin pain. We have a physical therapist that manipulates the colon during flares of groin pain. We discussed treatment of chronic testicle pain such as ice/heat, NSAIDs, consider gabapentin or a cord block.  We also discussed lower abd/pelvic floor PT. He's doing well. He can stop tamsulosin. They have IUI coming up which means he must he likely has an adequate sperm count.  I discussed the assessment and treatment plan with the patient. The patient was provided an opportunity to ask questions and all were answered. The patient agreed with the plan and demonstrated an understanding of the instructions.   The patient was advised to call back or seek an in-person evaluation if the symptoms worsen or if the condition fails to improve as anticipated.  I provided 7 minutes, 16 seconds of non-face-to-face time during this encounter.   Festus Aloe, MD

## 2019-03-29 NOTE — Progress Notes (Signed)
Patient: Jesus Gardner Jesus Gardner Male    DOB: 06/14/1991   28 y.o.   MRN: [id number] Visit Date: 03/29/2019  Today's Provider: Mila Merryonald Caydan Mctavish, MD   No chief complaint on file.  Subjective:     HPI    Follow up ER visit  Patient was seen in ER for Numbness and tingling on the left side on 03/26/2019. He presented to ER concerned about his heart, but cardiac workup was normal.  He was diagnosed with Cervical Radiculaopathy. Treatment for this included EKG, Xray. He reports this condition is worsening. Pt states his symptoms come and gone. Pt now he is also complaining of right sided numbness tingling. He has been having left sided arm hand tingling a few days prior to ER visit, but then started to affect the right arm as well. Symptoms have been waxing and waning since visit, but were much more severe than usual yesterday. Has tried taking aspirin which provided minimal relief. States he notices it more when lying down and mostly resolves when up and walking around.   ------------------------------------------------------------------------------------    Follow up for Diverticulosis  The patient was last seen for this 2 weeks ago. Changes made at last visit include reassurance and recommended increasing fiber in diet.   He reports excellent compliance with treatment. He feels that condition is Improved. Has had no more episodes of blood in stool. CT of abdomen was only remarkable for diverticulosis and subcentimeter right mid abdominal and right lower quadrant lymph nodes, thought to possible indicate mesenteric adenitis. Of note is that has been having recurrent pelvic pain which resolves with antibiotic the last few years. He has follow up virtual visit with urologist, Dr. Mena GoesEskridge later today, but is not currently having any abdominal or pelvis pain.   ------------------------------------------------------------------------------------   Results for orders placed or performed  during the hospital encounter of 03/26/19  Basic metabolic panel  Result Value Ref Range   Sodium 140 135 - 145 mmol/L   Potassium 4.2 3.5 - 5.1 mmol/L   Chloride 102 98 - 111 mmol/L   CO2 28 22 - 32 mmol/L   Glucose, Bld 102 (H) 70 - 99 mg/dL   BUN 12 6 - 20 mg/dL   Creatinine, Ser 4.090.90 0.61 - 1.24 mg/dL   Calcium 9.6 8.9 - 81.110.3 mg/dL   GFR calc non Af Amer >60 >60 mL/min   GFR calc Af Amer >60 >60 mL/min   Anion gap 10 5 - 15  CBC  Result Value Ref Range   WBC 8.1 4.0 - 10.5 K/uL   RBC 4.99 4.22 - 5.81 MIL/uL   Hemoglobin 14.1 13.0 - 17.0 g/dL   HCT 91.443.1 78.239.0 - 95.652.0 %   MCV 86.4 80.0 - 100.0 fL   MCH 28.3 26.0 - 34.0 pg   MCHC 32.7 30.0 - 36.0 g/dL   RDW 21.313.4 08.611.5 - 57.815.5 %   Platelets 241 150 - 400 K/uL   nRBC 0.0 0.0 - 0.2 %  Troponin I - ONCE - STAT  Result Value Ref Range   Troponin I <0.03 <0.03 ng/mL   Dg Chest 2 View  Result Date: 03/26/2019 CLINICAL DATA:  Left arm pain and face pain. EXAM: CHEST - 2 VIEW COMPARISON:  None. FINDINGS: The heart size and mediastinal contours are within normal limits. Both lungs are clear. The visualized skeletal structures are unremarkable. IMPRESSION: No active cardiopulmonary disease. Electronically Signed   By: Katherine Mantlehristopher  Green M.Jesus.   On: 03/26/2019  00:59   Ct Abdomen Pelvis W Contrast  Result Date: 03/21/2019  IMPRESSION: 1. No adenopathy by size criteria. However, there are subcentimeter right mid abdominal and right lower quadrant lymph nodes. In the appropriate clinical setting, these lymph nodes could be indicative of a degree of mesenteric adenitis. 2. Appendix appears normal. No bowel obstruction. No abscess in the abdomen or pelvis. There are scattered colonic diverticula without diverticulitis. 3. No renal or ureteral calculi evident. No hydronephrosis. Urinary bladder is midline with wall thickness within normal limits. 4. Prominent spleen of uncertain etiology. No splenic lesions evident. Electronically Signed   By: Lowella Grip III M.Jesus.   On: 03/21/2019 11:38      Allergies  Allergen Reactions   Pollen Extract      Current Outpatient Medications:    fexofenadine-pseudoephedrine (ALLEGRA-Jesus ALLERGY & CONGESTION) 180-240 MG 24 hr tablet, Take 1 tablet by mouth daily., Disp: , Rfl:    fluticasone (FLONASE) 50 MCG/ACT nasal spray, Place 2 sprays into both nostrils daily., Disp: 16 g, Rfl: 5   Multiple Vitamin (MULTIVITAMIN) capsule, Take 1 capsule by mouth daily., Disp: , Rfl:    Omega-3 Fatty Acids (FISH OIL) 1000 MG CAPS, Take 2 capsules by mouth 2 (two) times daily., Disp: , Rfl:    omeprazole (PRILOSEC) 20 MG capsule, Take 20 mg by mouth daily., Disp: , Rfl:    tamsulosin (FLOMAX) 0.4 MG CAPS capsule, Take 1 capsule (0.4 mg total) by mouth daily as needed (dysuria, weak stream)., Disp: 30 capsule, Rfl: 0   cetirizine (ZYRTEC) 10 MG tablet, Take 10 mg by mouth daily., Disp: , Rfl:   Review of Systems  Constitutional: Positive for fatigue. Negative for activity change, appetite change, chills, diaphoresis, fever and unexpected weight change.  HENT: Positive for postnasal drip and sore throat.   Respiratory: Negative.   Cardiovascular: Negative.   Gastrointestinal: Positive for abdominal pain and diarrhea. Negative for abdominal distention, blood in stool, constipation, nausea, rectal pain and vomiting.  Musculoskeletal: Positive for arthralgias, back pain, neck pain and neck stiffness. Negative for gait problem, joint swelling and myalgias.  Allergic/Immunologic: Positive for environmental allergies.  Neurological: Positive for light-headedness, numbness and headaches. Negative for dizziness.    Social History   Tobacco Use   Smoking status: Never Smoker   Smokeless tobacco: Never Used  Substance Use Topics   Alcohol use: Yes    Alcohol/week: 0.0 standard drinks    Comment: occasionally      Objective:   BP 114/78 (BP Location: Right Arm, Patient Position: Sitting, Cuff Size:  Large)    Pulse 80    Temp 98.3 F (36.8 C) (Oral)    Resp 16    Wt (!) 328 lb (148.8 kg)    BMI 41.00 kg/m  Vitals:   03/29/19 0820  BP: 114/78  Pulse: 80  Resp: 16  Temp: 98.3 F (36.8 C)  TempSrc: Oral  Weight: (!) 328 lb (148.8 kg)     Physical Exam  General Appearance:    Alert, cooperative, no distress, obese  Eyes:    PERRL, conjunctiva/corneas clear, EOM's intact       Neck:    Tingling in arms reproduced by palpation of cervical spine. FROM of spine.   Ext:    FROM of shoulders and arms. Slight tenderness along muscles of left upper extremity. No gross deformities.   Neurologic:   Awake, alert, oriented x 3. No apparent focal neurological  defect.           Assessment & Plan    1. Radiculopathy, unspecified spinal region Start 6 day prednisone taper - DG Cervical Spine Complete; Future  2. Neck pain above  3. Diverticulosis Per recent CT.   4. Blood in stool Resolved since starting high fiber diet.   5. Testicular pain, right Has repeatedly responded well to oral antibiotic and thought to be due to prostatitis. Unclear if there is any relationship to diverticulosis. He is going to discuss this further with urologist.      Mila Merryonald Medora Roorda, MD  [facility]

## 2019-03-29 NOTE — Telephone Encounter (Signed)
Patient had a virtual visit with Dr. Junious Silk today.  He forgot to request a refill request for tamsulosin to be sent to the [facility].

## 2019-03-29 NOTE — Telephone Encounter (Signed)
Closed in error.

## 2019-03-30 ENCOUNTER — Telehealth: Payer: Self-pay

## 2019-03-30 MED ORDER — TAMSULOSIN HCL 0.4 MG PO CAPS: 0.4000 mg | ORAL_CAPSULE | Freq: Every day | ORAL | 0 refills | Status: DC | PRN

## 2019-03-30 NOTE — Telephone Encounter (Signed)
Patient advised and verbally voiced understanding.  

## 2019-03-30 NOTE — Telephone Encounter (Signed)
Refill sent.

## 2019-03-30 NOTE — Telephone Encounter (Signed)
-----   Message from Birdie Sons, MD sent at 03/29/2019  3:04 PM EDT ----- Odette Horns of spine is normal. Numbness is probably pinched nerve from inflammation of neck muscles. Prednisone should help. Let me know if not much better over the weekend.

## 2019-03-30 NOTE — Telephone Encounter (Signed)
LMTCB 03/30/2019   Thanks,   -Laura  

## 2019-03-30 NOTE — Addendum Note (Signed)
Addended by: Tommy Rainwater on: 03/30/2019 09:04 AM   Modules accepted: Orders

## 2019-04-03 ENCOUNTER — Encounter: Payer: Self-pay | Admitting: Family Medicine

## 2019-04-03 ENCOUNTER — Telehealth: Payer: Self-pay | Admitting: Family Medicine

## 2019-04-03 DIAGNOSIS — R200 Anesthesia of skin: Secondary | ICD-10-CM

## 2019-04-03 NOTE — Telephone Encounter (Signed)
If he is still having numbness and tingling on the right side, then he needs MRI of brain so see if there is anything going on in central nervous causing sx.

## 2019-04-03 NOTE — Telephone Encounter (Signed)
Pt agreed to the MRI referral.    Thanks,   -Mickel Baas

## 2019-04-03 NOTE — Telephone Encounter (Signed)
Pt is not doing better.  He was at first but now is having numbness and tingling on left side and all over.  Feeling very bad.  He states the prednisone is making him flushed and increases his heart rate.  Please call him back at [phone] asap today.  Thanks, [employer]

## 2019-04-03 NOTE — Telephone Encounter (Signed)
Patient requesting call back from [email or web address]

## 2019-04-05 ENCOUNTER — Telehealth: Payer: Self-pay

## 2019-04-05 NOTE — Telephone Encounter (Signed)
Patient was seen in office on 03/29/2019. A MRI was ordered. Patient states he is not able to have MRI until 04/20/2019. Patient states symptoms are still changing. He would like to update nurse on all the new symptoms and as see what Dr. Maralyn Sago recommendations would be. CB#[phone]

## 2019-04-05 NOTE — Telephone Encounter (Signed)
Pt states he finished prednisone taper but felt "terrible while taking it" He had pressure in his head and neck, felt flush, racing heart.  Pt states this has improved.   He also wanted to mention yesterday his symptoms included Left leg numbness tingling, pressure on the left side of his neck.  Pt tried ibuprofen, heat, cold with no relief.    Today the symptoms above improved somewhat but is still having pressure in his neck but also has pressure and numbness in "two spots on his back".    Pt is wanting to know if maybe a muscle relaxer would help, or if there is something else that could help.    [facility] pharmacy.    Thanks,   -Mickel Baas

## 2019-04-05 NOTE — Telephone Encounter (Signed)
Need more info. whats changed

## 2019-04-06 ENCOUNTER — Telehealth: Payer: Self-pay | Admitting: Family Medicine

## 2019-04-06 MED ORDER — CYCLOBENZAPRINE HCL 5 MG PO TABS: 5.0000 mg | ORAL_TABLET | Freq: Three times a day (TID) | ORAL | 1 refills | Status: DC | PRN

## 2019-04-06 NOTE — Telephone Encounter (Signed)
Pt called back today wanting to speak with Mickel Baas, CMA.  Mickel Baas is not in today, however I let pt know he had medication that was called in the pharmacy.  He wants a call back on Friday to go over what Dr. Caryn Section advises.  Thanks, [employer]

## 2019-04-06 NOTE — Telephone Encounter (Signed)
Have sent prescription for muscle relaxer to try to Jesus Gardner.

## 2019-04-20 ENCOUNTER — Ambulatory Visit
Admission: RE | Admit: 2019-04-20 | Discharge: 2019-04-20 | Disposition: A | Discharge status: Home / Self Care | Payer: 59 | Source: Ambulatory Visit | Attending: Family Medicine | Admitting: Family Medicine

## 2019-04-20 ENCOUNTER — Other Ambulatory Visit: Payer: Self-pay

## 2019-04-20 DIAGNOSIS — R202 Paresthesia of skin: Secondary | ICD-10-CM | POA: Diagnosis not present

## 2019-04-20 DIAGNOSIS — R200 Anesthesia of skin: Secondary | ICD-10-CM | POA: Diagnosis not present

## 2019-04-20 DIAGNOSIS — R51 Headache: Secondary | ICD-10-CM | POA: Diagnosis not present

## 2019-04-20 MED ORDER — GADOBUTROL 1 MMOL/ML IV SOLN
10.0000 mL | Freq: Once | INTRAVENOUS | Status: AC | PRN
  Administered 2019-04-20: 10 mL via INTRAVENOUS

## 2019-04-21 ENCOUNTER — Telehealth: Payer: Self-pay

## 2019-04-21 NOTE — Telephone Encounter (Signed)
[id number] 

## 2019-04-21 NOTE — Telephone Encounter (Signed)
Pt returned missed call. ° °Thanks, °TGH °

## 2019-04-21 NOTE — Telephone Encounter (Signed)
Patient advised he states that numbness has improved completley in his arm, but still has numbness from time in his leg and face. Patient reports that muscle relaxant is helping. KW

## 2019-04-21 NOTE — Telephone Encounter (Signed)
-----   Message from Birdie Sons, MD sent at 04/20/2019 12:46 PM EDT ----- MRI is completely normal. No explanation for his symptoms. If he is still having numbness or tingling then he should be referred to neurology for further evaluation.

## 2019-04-26 ENCOUNTER — Other Ambulatory Visit: Payer: Self-pay

## 2019-04-26 MED ORDER — CYCLOBENZAPRINE HCL 5 MG PO TABS: 5.0000 mg | ORAL_TABLET | Freq: Three times a day (TID) | ORAL | 3 refills | Status: DC | PRN

## 2019-06-26 ENCOUNTER — Telehealth: Payer: Self-pay | Admitting: Family Medicine

## 2019-06-26 MED ORDER — FLUTICASONE PROPIONATE 50 MCG/ACT NA SUSP: 2.0000 | Freq: Every day | NASAL | 5 refills | Status: DC

## 2019-06-26 NOTE — Telephone Encounter (Signed)
Pt called asking if he can get a refill on Flonase nasal spray  [place]

## 2019-08-11 ENCOUNTER — Other Ambulatory Visit: Payer: Self-pay | Admitting: *Deleted

## 2019-08-11 DIAGNOSIS — Z20822 Contact with and (suspected) exposure to covid-19: Secondary | ICD-10-CM

## 2019-08-12 LAB — NOVEL CORONAVIRUS, NAA: SARS-CoV-2, NAA: NOT DETECTED

## 2019-08-17 ENCOUNTER — Ambulatory Visit: Payer: Self-pay | Admitting: Physician Assistant

## 2019-08-21 DIAGNOSIS — H5213 Myopia, bilateral: Secondary | ICD-10-CM | POA: Diagnosis not present

## 2019-10-03 ENCOUNTER — Ambulatory Visit: Payer: Self-pay | Admitting: *Deleted

## 2019-10-03 NOTE — Telephone Encounter (Signed)
He will need labs, xrays, ekgs and maybe ct scan. We will not be able to get these done in a timely manner. He needs to go to ED or urgent care.

## 2019-10-03 NOTE — Telephone Encounter (Addendum)
Pt called with complaints of chest and left shoulder pain; his symptoms started on 09/29/2019; he says his temp was 94.5 because he had been outside in the weather;" he felt "light headed and out of it", the left shoulder pain and "chest heaviness" started PM 10/02/2019; the pt says the symptoms are fluctuating from left shoulder pain to chest heaviness; his left shoulder pain continues; he feels like the shoulder pain is radiating to his chest; he feels like there is a fullness in his left chest; the pain occurs when he is sitting not with exertion; recommendations made per nurse triage protocol; spoke with Jiles Garter at [facility], and she concurs;  he refuses ED/UC, and would like to be seen by his PCP; the pt sees Dr Caryn Section, [facility]; he can be contacted at [phone]; will route to office for final disposition.    Reason for Disposition . Taking a deep breath makes pain worse  Answer Assessment - Initial Assessment Questions 1. LOCATION: "Where does it hurt?"       Left chest 2. RADIATION: "Does the pain go anywhere else?" (e.g., into neck, jaw, arms, back)    Also having shoulder pain 3. ONSET: "When did the chest pain begin?" (Minutes, hours or days)     10/02/2019 4. PATTERN "Does the pain come and go, or has it been constant since it started?"  "Does it get worse with exertion?"      Intermittent; worse with sitting 5. DURATION: "How long does it last" (e.g., seconds, minutes, hours)    hours 6. SEVERITY: "How bad is the pain?"  (e.g., Scale 1-10; mild, moderate, or severe)    - MILD (1-3): doesn't interfere with normal activities     - MODERATE (4-7): interferes with normal activities or awakens from sleep    - SEVERE (8-10): excruciating pain, unable to do any normal activities       Mild (quick and sharp); shoulder pain (dull, full, mild)  7. CARDIAC RISK FACTORS: "Do you have any history of heart problems or risk factors for heart disease?" (e.g., angina, prior  heart attack; diabetes, high blood pressure, high cholesterol, smoker, or strong family history of heart disease)   Family hx high cholesterol 8. PULMONARY RISK FACTORS: "Do you have any history of lung disease?"  (e.g., blood clots in lung, asthma, emphysema, birth control pills) no 9. CAUSE: "What do you think is causing the chest pain?"  left shoulder pain 10. OTHER SYMPTOMS: "Do you have any other symptoms?" (e.g., dizziness, nausea, vomiting, sweating, fever, difficulty breathing, cough)      Nausea and dizziness(better since taking Mucinex) 11. PREGNANCY: "Is there any chance you are pregnant?" "When was your last menstrual period?"      n/a  Protocols used: CHEST PAIN-A-AH

## 2019-10-03 NOTE — Telephone Encounter (Signed)
Patient advised. He refused to go to the ER.

## 2019-10-03 NOTE — Telephone Encounter (Signed)
From [facility], please review 

## 2019-10-04 ENCOUNTER — Ambulatory Visit: Payer: 59 | Attending: Internal Medicine

## 2019-10-04 DIAGNOSIS — Z20828 Contact with and (suspected) exposure to other viral communicable diseases: Secondary | ICD-10-CM | POA: Diagnosis not present

## 2019-10-04 DIAGNOSIS — Z20822 Contact with and (suspected) exposure to covid-19: Secondary | ICD-10-CM

## 2019-10-05 ENCOUNTER — Emergency Department: Payer: 59

## 2019-10-05 ENCOUNTER — Emergency Department
Admission: EM | Admit: 2019-10-05 | Discharge: 2019-10-05 | Disposition: A | Discharge status: Home / Self Care | Payer: 59 | Attending: Emergency Medicine | Admitting: Emergency Medicine

## 2019-10-05 ENCOUNTER — Other Ambulatory Visit: Payer: Self-pay

## 2019-10-05 ENCOUNTER — Encounter: Payer: Self-pay | Admitting: Emergency Medicine

## 2019-10-05 DIAGNOSIS — M25512 Pain in left shoulder: Secondary | ICD-10-CM | POA: Diagnosis not present

## 2019-10-05 DIAGNOSIS — R0789 Other chest pain: Secondary | ICD-10-CM | POA: Insufficient documentation

## 2019-10-05 DIAGNOSIS — R079 Chest pain, unspecified: Secondary | ICD-10-CM | POA: Diagnosis not present

## 2019-10-05 DIAGNOSIS — Z79899 Other long term (current) drug therapy: Secondary | ICD-10-CM | POA: Diagnosis not present

## 2019-10-05 LAB — BASIC METABOLIC PANEL
Anion gap: 10 (ref 5–15)
BUN: 13 mg/dL (ref 6–20)
CO2: 28 mmol/L (ref 22–32)
Calcium: 9.5 mg/dL (ref 8.9–10.3)
Chloride: 104 mmol/L (ref 98–111)
Creatinine, Ser: 0.85 mg/dL (ref 0.61–1.24)
GFR calc Af Amer: >60 mL/min (ref >60)
GFR calc non Af Amer: >60 mL/min (ref >60)
Glucose, Bld: 101 mg/dL — ABNORMAL HIGH (ref 70–99)
Potassium: 4.1 mmol/L (ref 3.5–5.1)
Sodium: 142 mmol/L (ref 135–145)

## 2019-10-05 LAB — CBC
HCT: 44.3 % (ref 39.0–52.0)
Hemoglobin: 14.4 g/dL (ref 13.0–17.0)
MCH: 28.2 pg (ref 26.0–34.0)
MCHC: 32.5 g/dL (ref 30.0–36.0)
MCV: 86.7 fL (ref 80.0–100.0)
Platelets: 238 10*3/uL (ref 150–400)
RBC: 5.11 MIL/uL (ref 4.22–5.81)
RDW: 13.1 % (ref 11.5–15.5)
WBC: 6.8 10*3/uL (ref 4.0–10.5)
nRBC: 0 % (ref 0.0–0.2)

## 2019-10-05 LAB — NOVEL CORONAVIRUS, NAA: SARS-CoV-2, NAA: NOT DETECTED

## 2019-10-05 LAB — TROPONIN I (HIGH SENSITIVITY): Troponin I (High Sensitivity): <2 ng/L (ref <18)

## 2019-10-05 MED ORDER — SODIUM CHLORIDE 0.9% FLUSH: 3.0000 mL | Freq: Once | INTRAVENOUS | Status: DC

## 2019-10-05 NOTE — Discharge Instructions (Addendum)
The troponin EKG and chest x-ray today were all okay.  Troponin was completely negative.  Just to be safe I will have you follow-up with the cardiologist.  He can see you at 9:00 on Monday.  I would give his office a call to confirm it.

## 2019-10-05 NOTE — ED Triage Notes (Signed)
Sent by doctor , left shoulder pain , radiating through to left chest wall , centralized burning sensation , Hx of GERD, " this different chest pain radiating to left neck , left arm sensation difference, no distress noted

## 2019-10-05 NOTE — ED Provider Notes (Signed)
[facility] Emergency Department Provider Note   ____________________________________________   First MD Initiated Contact with Patient 10/05/19 [phone]     (approximate)  I have reviewed the triage vital signs and the nursing notes.   HISTORY  Chief Complaint Chest Pain        HPI Jesus Gardner is a 28 y.o. male who reports 2 to 3 days of pain in the left shoulder that is worse with movement and radiates into his left chest at times.  This also seems to be worse with movement.  He is not short of breath.  He is not coughing.  He does not have a fever.  Pain is not made worse by anything else.         Past Medical History:  Diagnosis Date  . Costochondritis 12/30/2015  . Elevated transaminase level 03/12/2016  . History of chicken pox     Patient Active Problem List   Diagnosis Date Noted  . Diverticulosis 03/21/2019  . OSA (obstructive sleep apnea) 12/06/2018  . Elevated transaminase level 03/12/2016  . Allergic rhinitis 12/30/2015  . Arthralgia 12/30/2015  . Myalgia 12/30/2015  . Palpitations 12/30/2015  . Headache 12/30/2015  . Motion sickness 12/30/2015  . Obesity 12/30/2015  . Hyperlipidemia, mixed 03/06/2009  . Fam hx-ischem heart disease 10/12/1998    Past Surgical History:  Procedure Laterality Date  . None      Prior to Admission medications   Medication Sig Start Date End Date Taking? Authorizing Provider  cetirizine (ZYRTEC) 10 MG tablet Take 10 mg by mouth daily.    [provider]  cyclobenzaprine (FLEXERIL) 5 MG tablet Take 1-2 tablets (5-10 mg total) by mouth 3 (three) times daily as needed for muscle spasms. 04/26/19   Malva LimesFisher, Donald E, MD  fexofenadine-pseudoephedrine (ALLEGRA-D ALLERGY & CONGESTION) 180-240 MG 24 hr tablet Take 1 tablet by mouth daily.    [provider]  fluticasone (FLONASE) 50 MCG/ACT nasal spray Place 2 sprays into both nostrils daily. 06/26/19   Malva LimesFisher, Donald E, MD  Multiple  Vitamin (MULTIVITAMIN) capsule Take 1 capsule by mouth daily.    [provider]  Omega-3 Fatty Acids (FISH OIL) 1000 MG CAPS Take 2 capsules by mouth 2 (two) times daily. 05/07/10   [provider]  omeprazole (PRILOSEC) 20 MG capsule Take 20 mg by mouth daily.    [provider]  tamsulosin (FLOMAX) 0.4 MG CAPS capsule Take 1 capsule (0.4 mg total) by mouth daily as needed (dysuria, weak stream). 03/30/19   Jerilee FieldEskridge, Matthew, MD    Allergies Pollen extract  Family History  Problem Relation Age of Onset  . Non-Hodgkin's lymphoma Mother [id number]  . Cerebrovascular Disease Father   . Diabetes Father        type 2  . CAD Father     Social History Social History   Tobacco Use  . Smoking status: Never Smoker  . Smokeless tobacco: Never Used  Substance Use Topics  . Alcohol use: Yes    Alcohol/week: 0.0 standard drinks    Comment: occasionally  . Drug use: No    Review of Systems  Constitutional: No fever/chills Eyes: No visual changes. ENT: No sore throat. Cardiovascular:  chest pain. Respiratory: Denies shortness of breath. Gastrointestinal: No abdominal pain.  No nausea, no vomiting.  No diarrhea.  No constipation. Genitourinary: Negative for dysuria. Musculoskeletal: Negative for back pain. Skin: Negative for rash. Neurological: Negative for headaches, focal weakness   ____________________________________________   PHYSICAL  EXAM:  VITAL SIGNS: ED Triage Vitals  Enc Vitals Group     BP 10/05/19 0827 130/64     Pulse Rate 10/05/19 0827 77     Resp 10/05/19 0827 19     Temp 10/05/19 0827 99.1 F (37.3 C)     Temp Source 10/05/19 0827 Oral     SpO2 10/05/19 0827 99 %     Weight 10/05/19 0818 (!) 319 lb (144.7 kg)     Height 10/05/19 0818 6\' 3"  (1.905 m)     Head Circumference --      Peak Flow --      Pain Score 10/05/19 0818 1     Pain Loc --      Pain Edu? --      Excl. in GC? --     Constitutional: Alert and oriented. Well  appearing and in no acute distress. Eyes: Conjunctivae are normal. Head: Atraumatic. Nose: No congestion/rhinnorhea. Mouth/Throat: Mucous membranes are moist.  Oropharynx non-erythematous. Neck: No stridor.  Cardiovascular: Normal rate, regular rhythm. Grossly normal heart sounds.  Good peripheral circulation.  No pain on palpation of his chest Respiratory: Normal respiratory effort.  No retractions. Lungs CTAB. Gastrointestinal: Soft and nontender. No distention. No abdominal bruits. No CVA tenderness. Musculoskeletal: No lower extremity tenderness nor edema.  No joint effusions. Neurologic:  Normal speech and language. No gross focal neurologic deficits are appreciated.  Skin:  Skin is warm, dry and intact. No rash noted.  ____________________________________________   LABS (all labs ordered are listed, but only abnormal results are displayed)  Labs Reviewed  BASIC METABOLIC PANEL - Abnormal; Notable for the following components:      Result Value   Glucose, Bld 101 (*)    All other components within normal limits  CBC  TROPONIN I (HIGH SENSITIVITY)   ____________________________________________  EKG  EKG read interpreted by me shows normal sinus rhythm 78 normal axis essentially normal EKG ____________________________________________  RADIOLOGY  ED MD interpretation: Chest x-ray read by radiology reviewed by me is normal  Official radiology report(s): DG Chest 2 View  Result Date: 10/05/2019 CLINICAL DATA:  Chest pain EXAM: CHEST - 2 VIEW COMPARISON:  March 26, 2019 FINDINGS: Lungs are clear. Heart size and pulmonary vascularity are normal. No adenopathy. There is stable mild anterior wedging of T12 and L1. IMPRESSION: No edema or consolidation. Electronically Signed   By: March 28, 2019 III M.D.   On: 10/05/2019 08:50    ____________________________________________   PROCEDURES  Procedure(s) performed (including Critical  Care):  Procedures   ____________________________________________   INITIAL IMPRESSION / ASSESSMENT AND PLAN / ED COURSE  Patient reiterates that the pain has been ongoing for several days worse with movement and constant.  Troponin is negative EKG is okay chest x-ray is okay I will let him go.  He does have a family history of heart disease on his father side but he does not know much about it because he has not seen his father since he was 76.  I will therefore have him follow-up with cardiology for close evaluation just to make sure.   Discussed with Dr. 5 he will see him on Monday on the 28th at 9.          ____________________________________________   FINAL CLINICAL IMPRESSION(S) / ED DIAGNOSES  Final diagnoses:  Chest pain, unspecified type     ED Discharge Orders    None       Note:  This document was prepared using Dragon voice  recognition software and may include unintentional dictation errors.    Nena Polio, MD 10/05/19 1003

## 2019-10-05 NOTE — ED Notes (Signed)
NAD noted at time of D/C. Pt denies questions or concerns. Pt ambulatory to the lobby at this time.  

## 2019-11-03 ENCOUNTER — Ambulatory Visit: Payer: 59 | Admitting: Family Medicine

## 2019-11-07 ENCOUNTER — Encounter: Payer: Self-pay | Admitting: Family Medicine

## 2019-11-07 ENCOUNTER — Other Ambulatory Visit: Payer: Self-pay

## 2019-11-07 ENCOUNTER — Ambulatory Visit (INDEPENDENT_AMBULATORY_CARE_PROVIDER_SITE_OTHER): Payer: 59 | Admitting: Family Medicine

## 2019-11-07 ENCOUNTER — Ambulatory Visit: Payer: 59 | Admitting: Family Medicine

## 2019-11-07 VITALS — BP 128/86 | HR 75 | Temp 97.5°F | Resp 16 | Wt 331.0 lb

## 2019-11-07 DIAGNOSIS — M62830 Muscle spasm of back: Secondary | ICD-10-CM

## 2019-11-07 DIAGNOSIS — M7552 Bursitis of left shoulder: Secondary | ICD-10-CM | POA: Diagnosis not present

## 2019-11-07 NOTE — Progress Notes (Signed)
       Patient: Jesus Gardner Male    DOB: 02/05/1991   29 y.o.   MRN: [id number] Visit Date: 11/07/2019  Today's Provider: Mila Merry, MD   Chief Complaint  Patient presents with  . Shoulder Pain   Subjective:     Shoulder Pain  This is a new problem. The current episode started more than 1 month ago. Pertinent negatives include no fever, inability to bear weight, itching, joint locking, joint swelling, limited range of motion, numbness, stiffness or tingling. Treatments tried: muscle relaxer. The treatment provided mild relief.  Patient was seen in the ER on 10/05/2019 for chest pain and left shoulder pain. Cardiac work up was negative and patient was advised to closely follow up with Cardiology due to family history of heart disease.  Allergies  Allergen Reactions  . Pollen Extract      Current Outpatient Medications:  .  cyclobenzaprine (FLEXERIL) 5 MG tablet, Take 1-2 tablets (5-10 mg total) by mouth 3 (three) times daily as needed for muscle spasms., Disp: 40 tablet, Rfl: 3 .  fexofenadine-pseudoephedrine (ALLEGRA-D ALLERGY & CONGESTION) 180-240 MG 24 hr tablet, Take 1 tablet by mouth daily., Disp: , Rfl:  .  fluticasone (FLONASE) 50 MCG/ACT nasal spray, Place 2 sprays into both nostrils daily., Disp: 16 g, Rfl: 5 .  Multiple Vitamin (MULTIVITAMIN) capsule, Take 1 capsule by mouth daily., Disp: , Rfl:  .  Omega-3 Fatty Acids (FISH OIL) 1000 MG CAPS, Take 2 capsules by mouth 2 (two) times daily., Disp: , Rfl:  .  omeprazole (PRILOSEC) 20 MG capsule, Take 20 mg by mouth daily., Disp: , Rfl:  .  tamsulosin (FLOMAX) 0.4 MG CAPS capsule, Take 1 capsule (0.4 mg total) by mouth daily as needed (dysuria, weak stream)., Disp: 30 capsule, Rfl: 0  Review of Systems  Constitutional: Negative for fever.  Cardiovascular:       Soreness in chest  Musculoskeletal: Positive for arthralgias (left shoulder pain) and back pain (left side). Negative for stiffness.  Skin: Negative for  itching.  Neurological: Negative for tingling and numbness.    Social History   Tobacco Use  . Smoking status: Never Smoker  . Smokeless tobacco: Never Used  Substance Use Topics  . Alcohol use: Yes    Alcohol/week: 0.0 standard drinks    Comment: occasionally      Objective:   BP 128/86 (BP Location: Left Arm, Patient Position: Sitting, Cuff Size: Large)   Pulse 75   Temp (!) 97.5 F (36.4 C) (Temporal)   Resp 16   Wt (!) 331 lb (150.1 kg)   SpO2 98% Comment: room air  BMI 41.37 kg/m  Vitals:   11/07/19 1037  BP: 128/86  Pulse: 75  Resp: 16  Temp: (!) 97.5 F (36.4 C)  TempSrc: Temporal  SpO2: 98%  Weight: (!) 331 lb (150.1 kg)  Body mass index is 41.37 kg/m.   Physical Exam  Ender over acromium, positive impingement sign.  Mild swelling left parathoracic muscles.  No results found for any visits on 11/07/19.     Assessment & Plan    1. Subacromial bursitis of right shoulder joint Improving. Can take OTC NSAIDs.   2. Back muscle spasm Alternate heat/ice as needed. He asked about massage therapy which would likely be beneficial     Mila Merry, MD  [facility]

## 2019-11-14 DIAGNOSIS — Z113 Encounter for screening for infections with a predominantly sexual mode of transmission: Secondary | ICD-10-CM | POA: Diagnosis not present

## 2019-11-14 DIAGNOSIS — Z114 Encounter for screening for human immunodeficiency virus [HIV]: Secondary | ICD-10-CM | POA: Diagnosis not present

## 2019-11-14 DIAGNOSIS — Z1159 Encounter for screening for other viral diseases: Secondary | ICD-10-CM | POA: Diagnosis not present

## 2019-11-17 NOTE — Patient Instructions (Signed)
.   Please review the attached list of medications and notify my office if there are any errors.   . Please bring all of your medications to every appointment so we can make sure that our medication list is the same as yours.   

## 2019-12-23 IMAGING — US US SCROTUM W/ DOPPLER COMPLETE
1 series · 14 of 25 positions shown · non-contrast
Comparison: None

CLINICAL DATA: RIGHT testicular pain since [REDACTED]

EXAM:
SCROTAL ULTRASOUND
DOPPLER ULTRASOUND OF THE TESTICLES
TECHNIQUE: Complete ultrasound examination of the testicles, epididymis, and
other scrotal structures was performed. Color and spectral Doppler
ultrasound were also utilized to evaluate blood flow to the
testicles.

[Series 1: us scrotum w/ doppler complete · 0.07mm/px · 14 of 46 slices shown]
[im 1/46]
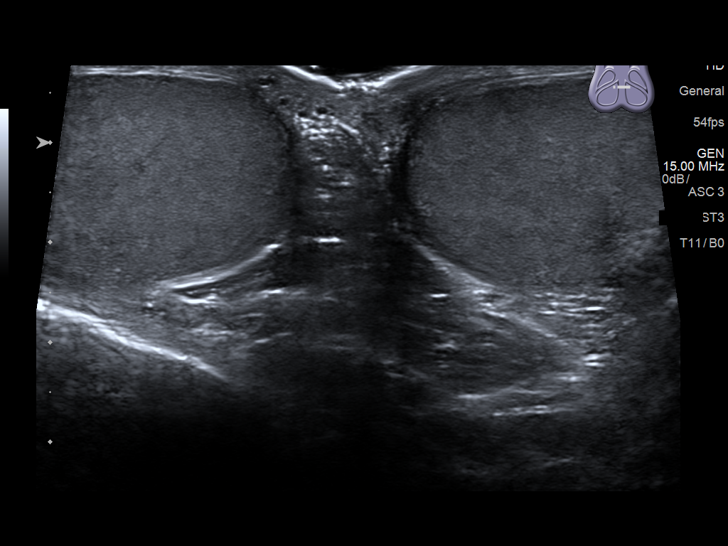
[im 4/46]
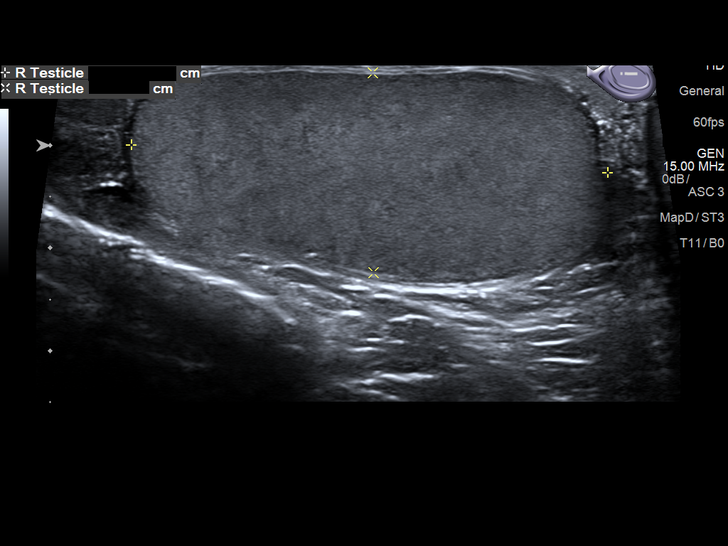
[im 8/46]
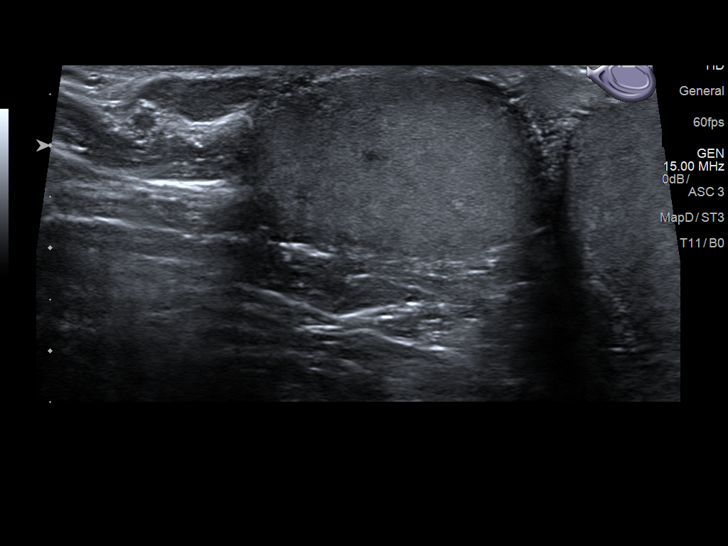
[im 12/46]
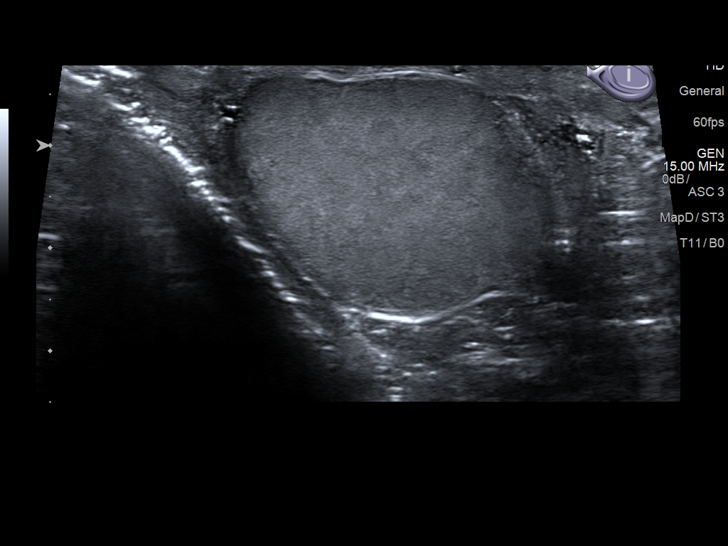
[im 16/46]
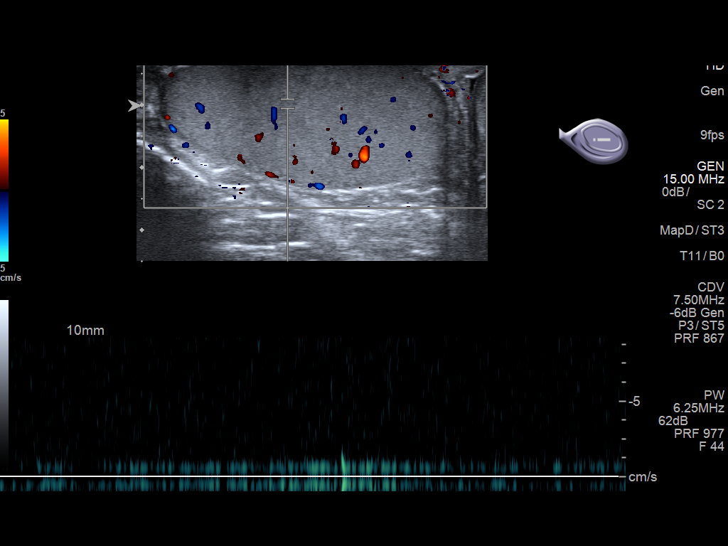
[im 17/46]
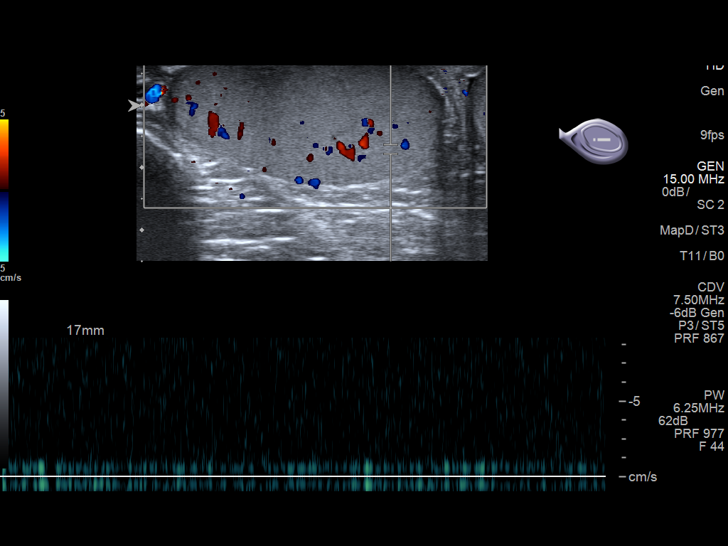
[im 21/46]
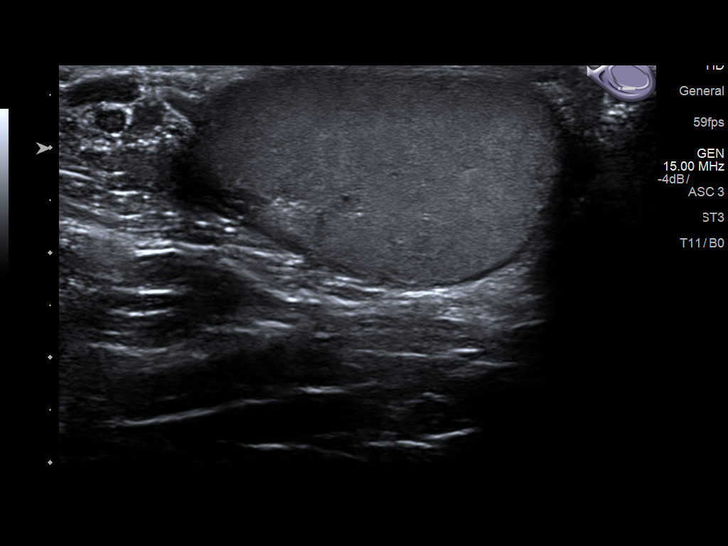
[im 25/46]
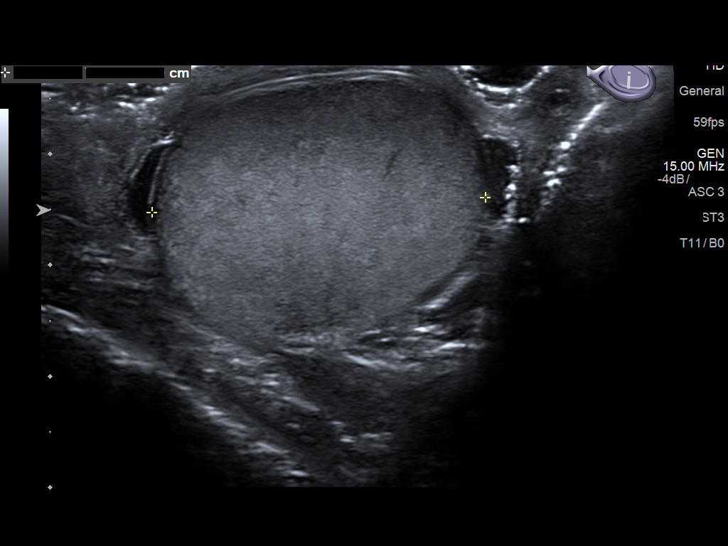
[im 29/46]
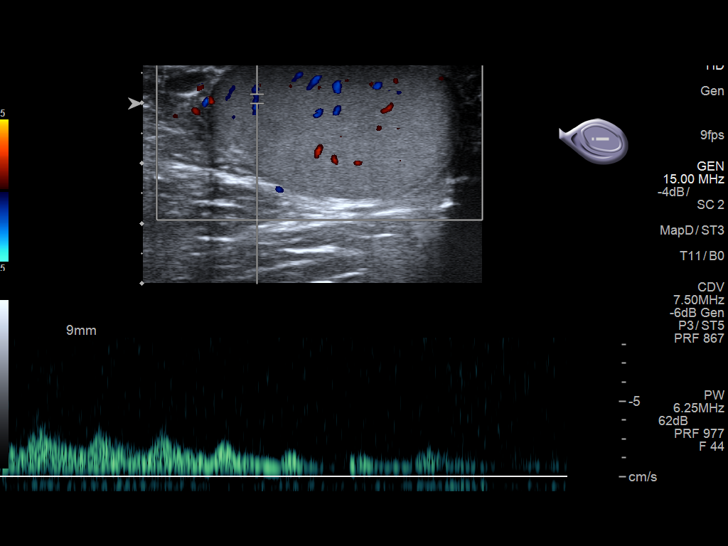
[im 31/46]
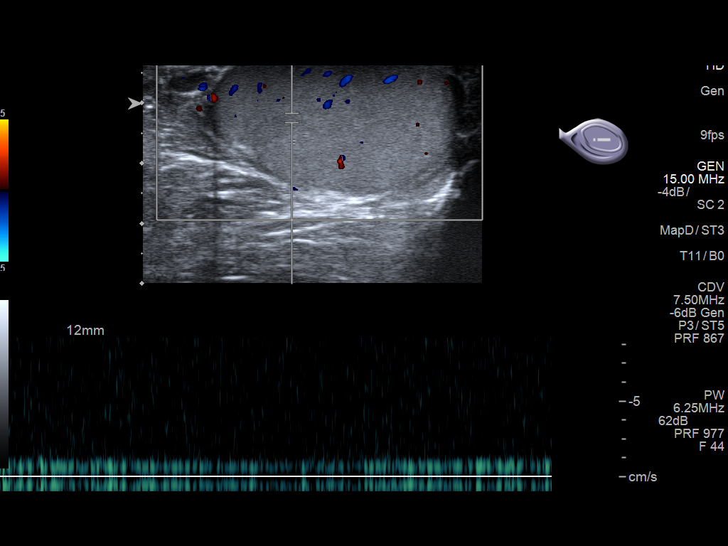
[im 34/46]
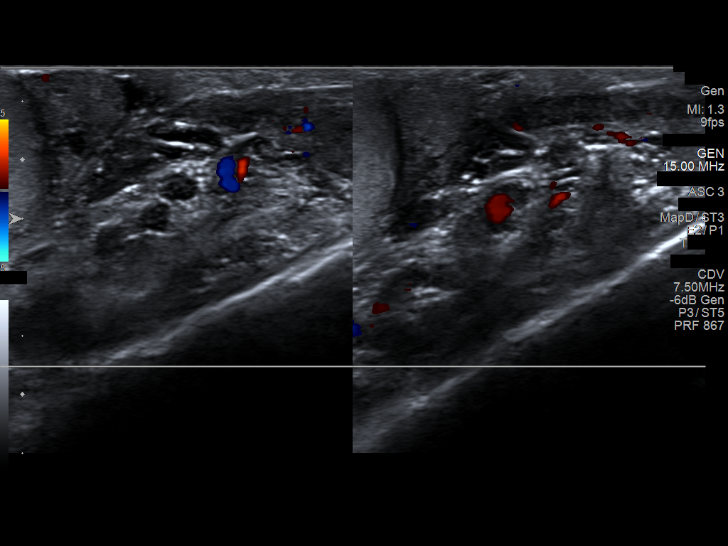
[im 38/46]
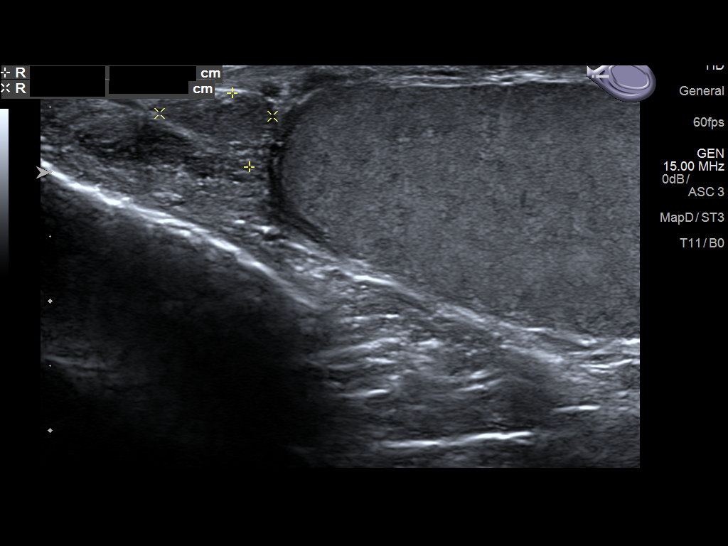
[im 42/46]
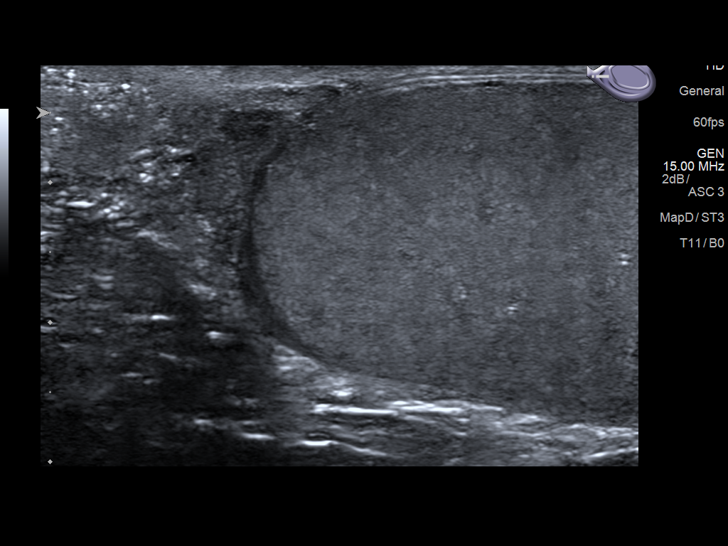
[im 46/46]
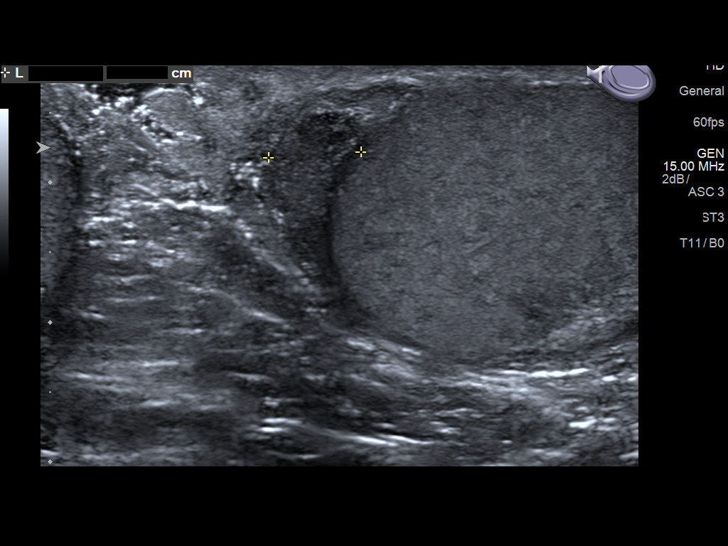

[14 of 25 positions shown; findings below may reference images not displayed]

FINDINGS: Right testicle

Measurements: 4.7 x 2.0 x 3.0 cm. Normal morphology without mass or
calcification. Internal blood flow present on color Doppler imaging.

Left testicle

Measurements: 4.3 x 2.2 x 3.0 cm. Normal morphology without mass or
calcification. Internal blood flow present on color Doppler imaging.

Right epididymis:  Normal in size and appearance.

Left epididymis:  Normal in size and appearance.

Hydrocele:  Absent bilaterally

Varicocele:  Absent bilaterally

Pulsed Doppler interrogation of both testes demonstrates normal low
resistance arterial and venous waveforms bilaterally.
IMPRESSION: Normal exam.

## 2019-12-28 NOTE — Progress Notes (Addendum)
Patient: Jesus Gardner, Male    DOB: 08-06-1991, 29 y.o.   MRN: [id number] Visit Date: 12/29/2019  Today's Provider: Lelon Huh, MD   Chief Complaint  Patient presents with  . Annual Exam  . Hyperlipidemia   Subjective:     Annual physical exam Jesus Gardner is a 29 y.o. male who presents today for health maintenance and complete physical. He feels fairly well. He reports exercising - walking 2-3 miles per day weather permitting. He reports he is sleeping fairly well.  -----------------------------------------------------------------  Lipid/Cholesterol, Follow-up:   Last seen for this 1 years ago.  Management changes since that visit include no change.  Last Lipid Panel:    Component Value Date/Time   CHOL 162 11/18/2018 1228   TRIG 176 (H) 11/18/2018 1228   HDL 34 (L) 11/18/2018 1228   CHOLHDL 4.8 11/18/2018 1228   LDLCALC 93 11/18/2018 1228   He reports good compliance with treatment. He is not having side effects.  Current symptoms include none  Weight trend: stable Prior visit with dietician: no Current diet: limits red meat, and bread Current exercise: walking  Wt Readings from Last 3 Encounters:  12/29/19 (!) 329 lb 12.8 oz (149.6 kg)  11/07/19 (!) 331 lb (150.1 kg)  10/05/19 (!) 319 lb (144.7 kg)    -------------------------------------------------------------------   Review of Systems  Constitutional: Negative.   HENT: Negative.   Eyes: Negative.   Respiratory: Negative.   Cardiovascular: Negative.   Gastrointestinal: Positive for diarrhea.  Endocrine: Negative.   Genitourinary: Negative.   Musculoskeletal: Positive for arthralgias, back pain and neck stiffness.  Skin: Negative.   Allergic/Immunologic: Negative.   Neurological: Negative.   Hematological: Negative.   Psychiatric/Behavioral: Negative.     Social History      He  reports that he has never smoked. He has never used smokeless tobacco. He reports current alcohol use.  He reports that he does not use drugs.       Social History   Socioeconomic History  . Marital status: Married    Spouse name: Not on file  . Number of children: Not on file  . Years of education: Not on file  . Highest education level: Not on file  Occupational History  . 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 Smoking status: Never Smoker  . Smokeless tobacco: Never Used  Substance and Sexual Activity  . Alcohol use: Yes    Alcohol/week: 0.0 standard drinks    Comment: occasionally  . Drug use: No  . Sexual activity: Not on file  Other Topics Concern  . Not on file  Social History Narrative  . Not on file   Social Determinants of Health   Financial Resource Strain:   . Difficulty of Paying Living Expenses:   Food Insecurity:   . Worried About Charity fundraiser in the Last Year:   . Arboriculturist in the Last Year:   Transportation Needs:   . Film/video editor (Medical):   Marland Kitchen Lack of Transportation (Non-Medical):   Physical Activity:   . Days of Exercise per Week:   . Minutes of Exercise per Session:   Stress:   . Feeling of Stress :   Social Connections:   . Frequency of Communication with Friends and Family:   . Frequency of Social Gatherings with Friends and Family:   . Attends Religious Services:   .  Active Member of Clubs or Organizations:   . Attends Archivist Meetings:   Marland Kitchen Marital Status:     Past Medical History:  Diagnosis Date  . Costochondritis 12/30/2015  . Elevated transaminase level 03/12/2016  . History of chicken pox      Patient Active Problem List   Diagnosis Date Noted  . Diverticulosis 03/21/2019  . OSA (obstructive sleep apnea) 12/06/2018  . Elevated transaminase level 03/12/2016  . Allergic rhinitis 12/30/2015  . Arthralgia 12/30/2015  . Myalgia 12/30/2015  . Palpitations 12/30/2015  . Headache 12/30/2015  . Motion sickness 12/30/2015  . Obesity 12/30/2015  .  Hyperlipidemia, mixed 03/06/2009  . Fam hx-ischem heart disease 10/12/1998    Past Surgical History:  Procedure Laterality Date  . None      Family History        Family Status  Relation Name Status  . Mother  Deceased  . Father  Alive       history of substance abuse  . Sister half- sister Alive  . Brother half -brother Alive        His family history includes CAD in his father; Cerebrovascular Disease in his father; Diabetes in his father; Non-Hodgkin's lymphoma (age of onset: 37) in his mother.      Allergies  Allergen Reactions  . Pollen Extract      Current Outpatient Medications:  .  cyclobenzaprine (FLEXERIL) 5 MG tablet, Take 1-2 tablets (5-10 mg total) by mouth 3 (three) times daily as needed for muscle spasms., Disp: 40 tablet, Rfl: 3 .  fexofenadine-pseudoephedrine (ALLEGRA-D ALLERGY & CONGESTION) 180-240 MG 24 hr tablet, Take 1 tablet by mouth daily., Disp: , Rfl:  .  fluticasone (FLONASE) 50 MCG/ACT nasal spray, Place 2 sprays into both nostrils daily., Disp: 16 g, Rfl: 5 .  Multiple Vitamin (MULTIVITAMIN) capsule, Take 1 capsule by mouth daily., Disp: , Rfl:  .  Omega-3 Fatty Acids (FISH OIL) 1000 MG CAPS, Take 2 capsules by mouth 2 (two) times daily., Disp: , Rfl:  .  omeprazole (PRILOSEC) 20 MG capsule, Take 20 mg by mouth daily., Disp: , Rfl:    Patient Care Team: Birdie Sons, MD as PCP - General (Family Medicine)    Objective:    Vitals: BP 120/78 (BP Location: Right Arm, Patient Position: Sitting, Cuff Size: Large)   Pulse 75   Temp (!) 97.1 F (36.2 C) (Temporal)   Ht _0  (1.905 m)   Wt (!) 329 lb 12.8 oz (149.6 kg)   BMI 41.22 kg/m    Vitals:   12/29/19 1001  BP: 120/78  Pulse: 75  Temp: (!) 97.1 F (36.2 C)  TempSrc: Temporal  Weight: (!) 329 lb 12.8 oz (149.6 kg)  Height: _1  (1.905 m)     Physical Exam   General: Appearance:    Severely obese male in no acute distress  Eyes:    PERRL, conjunctiva/corneas clear, EOM's  intact       Lungs:     Clear to auscultation bilaterally, respirations unlabored  Heart:    Normal heart rate. Normal rhythm. No murmurs, rubs, or gallops.   MS:   All extremities are intact.   Neurologic:   Awake, alert, oriented x 3. No apparent focal neurological           defect.        Depression Screen PHQ 2/9 Scores 12/29/2019 11/16/2018 10/13/2017  PHQ - 2 Score _2 PHQ- 9 Score 3  3 4       Assessment & Plan:     Routine Health Maintenance and Physical Exam  Exercise Activities and Dietary recommendations Goals   None     Immunization History  Administered Date(s) Administered  . DTaP 12/18/1988, 02/16/1989, 04/26/1989, 04/29/1990, 02/13/1993  . Hepatitis B 08/06/2000, 09/10/2000, 02/11/2001  . HiB (PRP-OMP) 10/22/1989, 01/28/1990  . IPV 12/18/1988, 02/16/1989, 04/29/1990, 02/13/1993  . Influenza,inj,Quad PF,6+ Mos 11/16/2018  . Influenza-Unspecified 07/28/2019  . MMR 01/28/1990, 02/13/1993  . Td 03/24/2006  . Tdap 04/26/2012    Health Maintenance  Topic Date Due  . HIV Screening  Never done  . TETANUS/TDAP  04/26/2022  . INFLUENZA VACCINE  Completed     Discussed health benefits of physical activity, and encouraged him to engage in regular exercise appropriate for his age and condition.    --------------------------------------------------------------------  1. Annual physical exam   2. Gastroesophageal reflux disease, unspecified whether esophagitis present He reports that omeprazole works well, but he only has symptoms if he slips on his diet. Advised it's best to control symptom with diet but he can take omeprazole prn.   3. Morbid obesity ([place]) Counseled regarding prudent diet and regular exercise.   - Vitamin B12  4. Encounter for special screening examination for cardiovascular disorder  - Lipid panel  5. Encounter for screening for hematologic disorder  - CBC  6. Encounter for screening for HIV  - HIV Antibody (routine testing w  rflx)  7. Screening for metabolic disorder  - Comprehensive metabolic panel - Vitamin I71  8. Screening for thyroid disorder  - TSH  9. OSA  He states that he has been doing better by keeping nasal passages open at night with use of fluticasone nasal spray, and feels like he is getting significantly better nights sleep.    Lelon Huh, MD  [facility]

## 2019-12-29 ENCOUNTER — Encounter: Payer: Self-pay | Admitting: Family Medicine

## 2019-12-29 ENCOUNTER — Other Ambulatory Visit: Payer: Self-pay

## 2019-12-29 ENCOUNTER — Ambulatory Visit (INDEPENDENT_AMBULATORY_CARE_PROVIDER_SITE_OTHER): Payer: 59 | Admitting: Family Medicine

## 2019-12-29 VITALS — BP 120/78 | HR 75 | Temp 97.1°F | Ht 75.0 in | Wt 329.8 lb

## 2019-12-29 DIAGNOSIS — Z136 Encounter for screening for cardiovascular disorders: Secondary | ICD-10-CM | POA: Diagnosis not present

## 2019-12-29 DIAGNOSIS — K219 Gastro-esophageal reflux disease without esophagitis: Secondary | ICD-10-CM | POA: Diagnosis not present

## 2019-12-29 DIAGNOSIS — Z1329 Encounter for screening for other suspected endocrine disorder: Secondary | ICD-10-CM | POA: Diagnosis not present

## 2019-12-29 DIAGNOSIS — G4733 Obstructive sleep apnea (adult) (pediatric): Secondary | ICD-10-CM | POA: Diagnosis not present

## 2019-12-29 DIAGNOSIS — Z13228 Encounter for screening for other metabolic disorders: Secondary | ICD-10-CM

## 2019-12-29 DIAGNOSIS — Z0000 Encounter for general adult medical examination without abnormal findings: Secondary | ICD-10-CM

## 2019-12-29 DIAGNOSIS — Z130 Encounter for screening for diseases of the blood and blood-forming organs and certain disorders involving the immune mechanism: Secondary | ICD-10-CM

## 2019-12-29 DIAGNOSIS — Z114 Encounter for screening for human immunodeficiency virus [HIV]: Secondary | ICD-10-CM | POA: Diagnosis not present

## 2019-12-29 NOTE — Patient Instructions (Addendum)
. Please review the attached list of medications and notify my office if there are any errors.   It is recommended to engage in 150 minutes of moderate exercise every week.    Try taking OTC B complex vitamin twice a day to help with energy level   Preventive Care 25-31 Years Old, Male Preventive care refers to lifestyle choices and visits with your health care provider that can promote health and wellness. This includes:  A yearly physical exam. This is also called an annual well check.  Regular dental and eye exams.  Immunizations.  Screening for certain conditions.  Healthy lifestyle choices, such as eating a healthy diet, getting regular exercise, not using drugs or products that contain nicotine and tobacco, and limiting alcohol use. What can I expect for my preventive care visit? Physical exam Your health care provider will check:  Height and weight. These may be used to calculate body mass index (BMI), which is a measurement that tells if you are at a healthy weight.  Heart rate and blood pressure.  Your skin for abnormal spots. Counseling Your health care provider may ask you questions about:  Alcohol, tobacco, and drug use.  Emotional well-being.  Home and relationship well-being.  Sexual activity.  Eating habits.  Work and work Statistician. What immunizations do I need?  Influenza (flu) vaccine  This is recommended every year. Tetanus, diphtheria, and pertussis (Tdap) vaccine  You may need a Td booster every 10 years. Varicella (chickenpox) vaccine  You may need this vaccine if you have not already been vaccinated. Human papillomavirus (HPV) vaccine  If recommended by your health care provider, you may need three doses over 6 months. Measles, mumps, and rubella (MMR) vaccine  You may need at least one dose of MMR. You may also need a second dose. Meningococcal conjugate (MenACWY) vaccine  One dose is recommended if you are 81-30 years old and a  Market researcher living in a residence hall, or if you have one of several medical conditions. You may also need additional booster doses. Pneumococcal conjugate (PCV13) vaccine  You may need this if you have certain conditions and were not previously vaccinated. Pneumococcal polysaccharide (PPSV23) vaccine  You may need one or two doses if you smoke cigarettes or if you have certain conditions. Hepatitis A vaccine  You may need this if you have certain conditions or if you travel or work in places where you may be exposed to hepatitis A. Hepatitis B vaccine  You may need this if you have certain conditions or if you travel or work in places where you may be exposed to hepatitis B. Haemophilus influenzae type b (Hib) vaccine  You may need this if you have certain risk factors. You may receive vaccines as individual doses or as more than one vaccine together in one shot (combination vaccines). Talk with your health care provider about the risks and benefits of combination vaccines. What tests do I need? Blood tests  Lipid and cholesterol levels. These may be checked every 5 years starting at age 70.  Hepatitis C test.  Hepatitis B test. Screening   Diabetes screening. This is done by checking your blood sugar (glucose) after you have not eaten for a while (fasting).  Sexually transmitted disease (STD) testing. Talk with your health care provider about your test results, treatment options, and if necessary, the need for more tests. Follow these instructions at home: Eating and drinking   Eat a diet that includes fresh fruits  and vegetables, whole grains, lean protein, and low-fat dairy products.  Take vitamin and mineral supplements as recommended by your health care provider.  Do not drink alcohol if your health care provider tells you not to drink.  If you drink alcohol: ? Limit how much you have to 0-2 drinks a day. ? Be aware of how much alcohol is in your  drink. In the [place] one drink equals one 12 oz bottle of beer (355 mL), one 5 oz glass of wine (148 mL), or one 1 oz glass of hard liquor (44 mL). Lifestyle  Take daily care of your teeth and gums.  Stay active. Exercise for at least 30 minutes on 5 or more days each week.  Do not use any products that contain nicotine or tobacco, such as cigarettes, e-cigarettes, and chewing tobacco. If you need help quitting, ask your health care provider.  If you are sexually active, practice safe sex. Use a condom or other form of protection to prevent STIs (sexually transmitted infections). What's next?  Go to your health care provider once a year for a well check visit.  Ask your health care provider how often you should have your eyes and teeth checked.  Stay up to date on all vaccines. This information is not intended to replace advice given to you by your health care provider. Make sure you discuss any questions you have with your health care provider. Document Revised: 09/22/2018 Document Reviewed: 09/22/2018 Elsevier Patient Education  2020 [employer].

## 2019-12-30 LAB — COMPREHENSIVE METABOLIC PANEL
ALT: 27 IU/L (ref 0–44)
AST: 17 IU/L (ref 0–40)
Albumin/Globulin Ratio: 2 (ref 1.2–2.2)
Albumin: 4.5 g/dL (ref 4.0–5.0)
Alkaline Phosphatase: 58 IU/L (ref 39–117)
BUN/Creatinine Ratio: 16 (ref 9–20)
BUN: 12 mg/dL (ref 6–20)
Bilirubin Total: 0.3 mg/dL (ref 0.0–1.2)
CO2: 23 mmol/L (ref 20–29)
Calcium: 9.4 mg/dL (ref 8.7–10.2)
Chloride: 103 mmol/L (ref 96–106)
Creatinine, Ser: 0.74 mg/dL — ABNORMAL LOW (ref 0.76–1.27)
GFR calc Af Amer: 142 mL/min/{1.73_m2} (ref >59)
GFR calc non Af Amer: 123 mL/min/{1.73_m2} (ref >59)
Globulin, Total: 2.3 g/dL (ref 1.5–4.5)
Glucose: 90 mg/dL (ref 65–99)
Potassium: 4.5 mmol/L (ref 3.5–5.2)
Sodium: 143 mmol/L (ref 134–144)
Total Protein: 6.8 g/dL (ref 6.0–8.5)

## 2019-12-30 LAB — LIPID PANEL
Chol/HDL Ratio: 4.9 ratio (ref 0.0–5.0)
Cholesterol, Total: 171 mg/dL (ref 100–199)
HDL: 35 mg/dL — ABNORMAL LOW (ref >39)
LDL Chol Calc (NIH): 103 mg/dL — ABNORMAL HIGH (ref 0–99)
Triglycerides: 188 mg/dL — ABNORMAL HIGH (ref 0–149)
VLDL Cholesterol Cal: 33 mg/dL (ref 5–40)

## 2019-12-30 LAB — HIV ANTIBODY (ROUTINE TESTING W REFLEX): HIV Screen 4th Generation wRfx: NONREACTIVE

## 2019-12-30 LAB — CBC
Hematocrit: 44.5 % (ref 37.5–51.0)
Hemoglobin: 14.3 g/dL (ref 13.0–17.7)
MCH: 28.4 pg (ref 26.6–33.0)
MCHC: 32.1 g/dL (ref 31.5–35.7)
MCV: 89 fL (ref 79–97)
Platelets: 228 10*3/uL (ref 150–450)
RBC: 5.03 x10E6/uL (ref 4.14–5.80)
RDW: 13.4 % (ref 11.6–15.4)
WBC: 4.7 10*3/uL (ref 3.4–10.8)

## 2019-12-30 LAB — TSH: TSH: 1.32 u[IU]/mL (ref 0.450–4.500)

## 2019-12-30 LAB — VITAMIN B12: Vitamin B-12: 368 pg/mL (ref 232–1245)

## 2020-04-26 ENCOUNTER — Encounter: Payer: Self-pay | Admitting: Family Medicine

## 2020-04-30 ENCOUNTER — Other Ambulatory Visit: Payer: Self-pay | Admitting: Family Medicine

## 2020-04-30 DIAGNOSIS — K625 Hemorrhage of anus and rectum: Secondary | ICD-10-CM

## 2020-04-30 DIAGNOSIS — K5790 Diverticulosis of intestine, part unspecified, without perforation or abscess without bleeding: Secondary | ICD-10-CM

## 2020-04-30 DIAGNOSIS — R1030 Lower abdominal pain, unspecified: Secondary | ICD-10-CM

## 2020-05-01 ENCOUNTER — Other Ambulatory Visit: Payer: Self-pay | Admitting: Family Medicine

## 2020-05-01 MED ORDER — CYCLOBENZAPRINE HCL 5 MG PO TABS: 5.0000 mg | ORAL_TABLET | Freq: Three times a day (TID) | ORAL | 3 refills | Status: DC | PRN

## 2020-07-16 ENCOUNTER — Ambulatory Visit: Payer: 59 | Admitting: Gastroenterology

## 2020-07-30 ENCOUNTER — Encounter: Payer: Self-pay | Admitting: Gastroenterology

## 2020-07-30 ENCOUNTER — Other Ambulatory Visit: Payer: Self-pay

## 2020-07-30 ENCOUNTER — Encounter: Payer: Self-pay | Admitting: Family Medicine

## 2020-07-30 ENCOUNTER — Other Ambulatory Visit: Payer: Self-pay | Admitting: Gastroenterology

## 2020-07-30 ENCOUNTER — Ambulatory Visit (INDEPENDENT_AMBULATORY_CARE_PROVIDER_SITE_OTHER): Payer: 59 | Admitting: Gastroenterology

## 2020-07-30 VITALS — BP 121/78 | HR 87 | Temp 98.8°F | Wt 323.2 lb

## 2020-07-30 DIAGNOSIS — G8929 Other chronic pain: Secondary | ICD-10-CM

## 2020-07-30 DIAGNOSIS — R197 Diarrhea, unspecified: Secondary | ICD-10-CM | POA: Diagnosis not present

## 2020-07-30 DIAGNOSIS — R1031 Right lower quadrant pain: Secondary | ICD-10-CM | POA: Diagnosis not present

## 2020-07-30 DIAGNOSIS — K625 Hemorrhage of anus and rectum: Secondary | ICD-10-CM | POA: Insufficient documentation

## 2020-07-30 DIAGNOSIS — M6289 Other specified disorders of muscle: Secondary | ICD-10-CM

## 2020-07-30 MED ORDER — HYOSCYAMINE SULFATE 0.125 MG SL SUBL: 0.1250 mg | SUBLINGUAL_TABLET | SUBLINGUAL | 0 refills | Status: DC | PRN

## 2020-07-30 MED ORDER — PEG 3350-KCL-NA BICARB-NACL 420 G PO SOLR: 4000.0000 mL | Freq: Once | ORAL | 0 refills | Status: DC

## 2020-07-30 NOTE — Progress Notes (Signed)
Gastroenterology Consultation  Referring Provider:     Malva Limes, MD Primary Care Physician:  Malva Limes, MD Primary Gastroenterologist:  Dr. Servando Snare     Reason for Consultation:     Abdominal pain        HPI:   Jesus Gardner is a 29 y.o. y/o male referred for consultation & management of abdominal pain by Dr. Sherrie Mustache, Demetrios Isaacs, MD. This patient comes in today with a report of having abdominal discomfort that was in his right lower quadrant and right middle quadrant that went down to his testicles. The patient ended up getting a CT scan that showed him to have diverticulosis. The patient reports that he did stop red meat approximately a year and a half ago when he was told that it can be contributing to his symptoms. The patient has had intermittent attacks of abdominal pain that were accompanied with diarrhea and rectal bleeding. The patient states that the rectal bleeding is blood in the toilet. There is no report of any fevers chills nausea vomiting black stools or bloody stools. He also reports that the abdominal pain has been severe and has woken him although he has not had any change in bowel habits that have awakened him from sleep. The patient denies any unexplained weight loss. The patient reports that his abdominal pain is a crampy abdominal pain and usually is not a daily issue. The patient denies any family history of colon cancer or colon polyps.  The patient CT scan showed:  IMPRESSION: 1. No adenopathy by size criteria. However, there are subcentimeter right mid abdominal and right lower quadrant lymph nodes. In the appropriate clinical setting, these lymph nodes could be indicative of a degree of mesenteric adenitis. 2. Appendix appears normal. No bowel obstruction. No abscess in the abdomen or pelvis. There are scattered colonic diverticula without diverticulitis. 3. No renal or ureteral calculi evident. No hydronephrosis. Urinary bladder is midline with wall  thickness within normal limits. 4. Prominent spleen of uncertain etiology. No splenic lesions evident.   Back in March the patient's liver enzymes and CBC were normal.  Past Medical History:  Diagnosis Date   Costochondritis 12/30/2015   Elevated transaminase level 03/12/2016   History of chicken pox     Past Surgical History:  Procedure Laterality Date   None      Prior to Admission medications   Medication Sig Start Date End Date Taking? Authorizing Provider  cyclobenzaprine (FLEXERIL) 5 MG tablet Take 1-2 tablets (5-10 mg total) by mouth 3 (three) times daily as needed for muscle spasms. 05/01/20  Yes Malva Limes, MD  fexofenadine-pseudoephedrine (ALLEGRA-D ALLERGY & CONGESTION) 180-240 MG 24 hr tablet Take 1 tablet by mouth daily.   Yes [provider]  fluticasone (FLONASE) 50 MCG/ACT nasal spray Place 2 sprays into both nostrils daily. 06/26/19  Yes Malva Limes, MD  Multiple Vitamin (MULTIVITAMIN) capsule Take 1 capsule by mouth daily.   Yes [provider]  Omega-3 Fatty Acids (FISH OIL) 1000 MG CAPS Take 2 capsules by mouth 2 (two) times daily. 05/07/10  Yes [provider]  omeprazole (PRILOSEC) 20 MG capsule Take 20 mg by mouth daily.   Yes [provider]    Family History  Problem Relation Age of Onset   Non-Hodgkin's lymphoma Mother 54   Cerebrovascular Disease Father    Diabetes Father        type 2   CAD Father  Social History   Tobacco Use   Smoking status: Never Smoker   Smokeless tobacco: Never Used  Vaping Use   Vaping Use: Never used  Substance Use Topics   Alcohol use: Yes    Alcohol/week: 0.0 standard drinks    Comment: occasionally   Drug use: No    Allergies as of 07/30/2020 - Review Complete 12/29/2019  Allergen Reaction Noted   Pollen extract  12/30/2015    Review of Systems:    All systems reviewed and negative except where noted in HPI.   Physical Exam:  There were no  vitals taken for this visit. No LMP for male patient. General:   Alert,  Well-developed, well-nourished, pleasant and cooperative in NAD Head:  Normocephalic and atraumatic. Eyes:  Sclera clear, no icterus.   Conjunctiva pink. Ears:  Normal auditory acuity. Neck:  Supple; no masses or thyromegaly. Lungs:  Respirations even and unlabored.  Clear throughout to auscultation.   No wheezes, crackles, or rhonchi. No acute distress. Heart:  Regular rate and rhythm; no murmurs, clicks, rubs, or gallops. Abdomen:  Normal bowel sounds.  No bruits.  Soft, non-tender and non-distended without masses, hepatosplenomegaly or hernias noted.  No guarding or rebound tenderness.  Negative Carnett sign.   Rectal:  Deferred.  Pulses:  Normal pulses noted. Extremities:  No clubbing or edema.  No cyanosis. Neurologic:  Alert and oriented x3;  grossly normal neurologically. Skin:  Intact without significant lesions or rashes.  No jaundice. Lymph Nodes:  No significant cervical adenopathy. Psych:  Alert and cooperative. Normal mood and affect.  Imaging Studies: No results found.  Assessment and Plan:   Jesus Gardner is a 29 y.o. y/o male who comes in today with a finding of diverticulosis on a CT scan. The patient has had right lower quadrant abdominal pain with intermittent rectal bleeding and diarrhea. The patient may have irritable bowel syndrome since a lot of his symptoms are consistent with that except for the rectal bleeding. Due to the rectal bleeding the patient will be set up for colonoscopy. The patient will also be started on a trial of hyoscyamine to be taken sublingually when he has the abdominal pain to see if this helps his cramps. The patient has been explained the plan and will follow up in the time of the colonoscopy.    Midge Minium, MD. [facility]    Note: This dictation was prepared with Dragon dictation along with smaller phrase technology. Any transcriptional errors that result from this  process are unintentional.

## 2020-08-01 ENCOUNTER — Other Ambulatory Visit: Payer: Self-pay | Admitting: Family Medicine

## 2020-08-01 MED ORDER — TAMSULOSIN HCL 0.4 MG PO CAPS: 0.4000 mg | ORAL_CAPSULE | Freq: Every day | ORAL | 3 refills | Status: DC

## 2020-08-16 ENCOUNTER — Other Ambulatory Visit: Payer: Self-pay

## 2020-08-16 ENCOUNTER — Other Ambulatory Visit
Admission: RE | Admit: 2020-08-16 | Discharge: 2020-08-16 | Disposition: A | Discharge status: Home / Self Care | Payer: 59 | Source: Ambulatory Visit | Attending: Gastroenterology | Admitting: Gastroenterology

## 2020-08-16 DIAGNOSIS — Z20822 Contact with and (suspected) exposure to covid-19: Secondary | ICD-10-CM | POA: Insufficient documentation

## 2020-08-16 DIAGNOSIS — Z01812 Encounter for preprocedural laboratory examination: Secondary | ICD-10-CM | POA: Insufficient documentation

## 2020-08-17 LAB — SARS CORONAVIRUS 2 (TAT 6-24 HRS): SARS Coronavirus 2: NEGATIVE

## 2020-08-19 ENCOUNTER — Encounter: Payer: Self-pay | Admitting: Gastroenterology

## 2020-08-20 ENCOUNTER — Ambulatory Visit: Payer: 59 | Admitting: Anesthesiology

## 2020-08-20 ENCOUNTER — Encounter
Admission: RE | Disposition: A | Discharge status: Home / Self Care | Payer: Self-pay | Source: Home / Self Care | Attending: Gastroenterology

## 2020-08-20 ENCOUNTER — Other Ambulatory Visit: Payer: Self-pay

## 2020-08-20 ENCOUNTER — Ambulatory Visit
Admission: RE | Admit: 2020-08-20 | Discharge: 2020-08-20 | Disposition: A | Discharge status: Home / Self Care | Payer: 59 | Attending: Gastroenterology | Admitting: Gastroenterology

## 2020-08-20 DIAGNOSIS — K640 First degree hemorrhoids: Secondary | ICD-10-CM | POA: Diagnosis not present

## 2020-08-20 DIAGNOSIS — K633 Ulcer of intestine: Secondary | ICD-10-CM | POA: Diagnosis not present

## 2020-08-20 DIAGNOSIS — Z807 Family history of other malignant neoplasms of lymphoid, hematopoietic and related tissues: Secondary | ICD-10-CM | POA: Insufficient documentation

## 2020-08-20 DIAGNOSIS — Z823 Family history of stroke: Secondary | ICD-10-CM | POA: Insufficient documentation

## 2020-08-20 DIAGNOSIS — K921 Melena: Secondary | ICD-10-CM | POA: Insufficient documentation

## 2020-08-20 DIAGNOSIS — K529 Noninfective gastroenteritis and colitis, unspecified: Secondary | ICD-10-CM | POA: Insufficient documentation

## 2020-08-20 DIAGNOSIS — Z833 Family history of diabetes mellitus: Secondary | ICD-10-CM | POA: Diagnosis not present

## 2020-08-20 DIAGNOSIS — Z8249 Family history of ischemic heart disease and other diseases of the circulatory system: Secondary | ICD-10-CM | POA: Insufficient documentation

## 2020-08-20 DIAGNOSIS — K5730 Diverticulosis of large intestine without perforation or abscess without bleeding: Secondary | ICD-10-CM | POA: Diagnosis not present

## 2020-08-20 DIAGNOSIS — K5790 Diverticulosis of intestine, part unspecified, without perforation or abscess without bleeding: Secondary | ICD-10-CM | POA: Diagnosis not present

## 2020-08-20 DIAGNOSIS — K625 Hemorrhage of anus and rectum: Secondary | ICD-10-CM

## 2020-08-20 DIAGNOSIS — Z79899 Other long term (current) drug therapy: Secondary | ICD-10-CM | POA: Diagnosis not present

## 2020-08-20 HISTORY — PX: COLONOSCOPY WITH PROPOFOL: SHX5780

## 2020-08-20 SURGERY — COLONOSCOPY WITH PROPOFOL
Anesthesia: General

## 2020-08-20 MED ORDER — LIDOCAINE HCL (CARDIAC) PF 100 MG/5ML IV SOSY
PREFILLED_SYRINGE | INTRAVENOUS | Status: DC | PRN
  Administered 2020-08-20: 40 mg via INTRAVENOUS

## 2020-08-20 MED ORDER — LIDOCAINE HCL (PF) 1 % IJ SOLN
INTRAMUSCULAR | Status: AC
  Administered 2020-08-20: 0.3 mL
  Filled 2020-08-20: qty 2

## 2020-08-20 MED ORDER — PROPOFOL 500 MG/50ML IV EMUL
INTRAVENOUS | Status: AC
  Filled 2020-08-20: qty 50

## 2020-08-20 MED ORDER — SODIUM CHLORIDE 0.9 % IV SOLN
INTRAVENOUS | Status: DC
  Administered 2020-08-20: 1000 mL via INTRAVENOUS

## 2020-08-20 MED ORDER — PROPOFOL 10 MG/ML IV BOLUS
INTRAVENOUS | Status: DC | PRN
  Administered 2020-08-20: 200 mg via INTRAVENOUS

## 2020-08-20 MED ORDER — PROPOFOL 500 MG/50ML IV EMUL
INTRAVENOUS | Status: DC | PRN
  Administered 2020-08-20: 125 ug/kg/min via INTRAVENOUS

## 2020-08-20 MED ORDER — LIDOCAINE HCL (PF) 2 % IJ SOLN
INTRAMUSCULAR | Status: AC
  Filled 2020-08-20: qty 5

## 2020-08-20 NOTE — Anesthesia Postprocedure Evaluation (Signed)
Anesthesia Post Note  Patient: Jesus Gardner  Procedure(s) Performed: COLONOSCOPY WITH PROPOFOL (N/A )  Patient location during evaluation: Endoscopy Anesthesia Type: General Level of consciousness: awake and awake and alert Pain management: pain level controlled Vital Signs Assessment: post-procedure vital signs reviewed and stable Respiratory status: spontaneous breathing and nonlabored ventilation Cardiovascular status: blood pressure returned to baseline Postop Assessment: no headache and no apparent nausea or vomiting Anesthetic complications: no   No complications documented.   Last Vitals:  Vitals:   08/20/20 1045  BP: (!) 161/81  Pulse: 76  Resp: 16  Temp: 36.9 C  SpO2: 99%    Last Pain:  Vitals:   08/20/20 1045  TempSrc: Temporal  PainSc: 0-No pain                 Emilio Math

## 2020-08-20 NOTE — Op Note (Signed)
[facility] Gastroenterology Patient Name: Jesus Gardner Procedure Date: 08/20/2020 11:10 AM MRN: [id number] Account #: [id number] Date of Birth: 06/08/1991 Admit Type: Outpatient Age: 29 Room: [facility] ENDO ROOM 4 Gender: Male Note Status: Finalized Procedure:             Colonoscopy Indications:           Hematochezia Providers:             Midge Minium MD, MD Referring MD:          Demetrios Isaacs. Sherrie Mustache, MD (Referring MD) Medicines:             Propofol per Anesthesia Complications:         No immediate complications. Procedure:             Pre-Anesthesia Assessment:                        - Prior to the procedure, a History and Physical was                         performed, and patient medications and allergies were                         reviewed. The patient's tolerance of previous                         anesthesia was also reviewed. The risks and benefits                         of the procedure and the sedation options and risks                         were discussed with the patient. All questions were                         answered, and informed consent was obtained. Prior                         Anticoagulants: The patient has taken no previous                         anticoagulant or antiplatelet agents. ASA Grade                         Assessment: II - A patient with mild systemic disease.                         After reviewing the risks and benefits, the patient                         was deemed in satisfactory condition to undergo the                         procedure.                        After obtaining informed consent, the colonoscope was  passed under direct vision. Throughout the procedure,                         the patient's blood pressure, pulse, and oxygen                         saturations were monitored continuously. The                         Colonoscope was introduced through the anus and                          advanced to the the terminal ileum. The colonoscopy                         was performed without difficulty. The patient                         tolerated the procedure well. The quality of the bowel                         preparation was excellent. Findings:      The perianal and digital rectal examinations were normal.      Non-bleeding internal hemorrhoids were found during retroflexion. The       hemorrhoids were Grade I (internal hemorrhoids that do not prolapse).      Localized inflammation, mild in severity and characterized by shallow       ulcerations was found in the terminal ileum. Biopsies were taken with a       cold forceps for histology.      Multiple small-mouthed diverticula were found in the sigmoid colon. Impression:            - Non-bleeding internal hemorrhoids.                        - Ileitis. Biopsied.                        - Diverticulosis in the sigmoid colon. Recommendation:        - Discharge patient to home.                        - Resume previous diet.                        - Continue present medications.                        - Await pathology results. Procedure Code(s):     --- Professional ---                        [phone], Colonoscopy, flexible; with biopsy, single or                         multiple Diagnosis Code(s):     --- Professional ---                        K92.1, Melena (includes Hematochezia)  K52.9, Noninfective gastroenteritis and colitis,                         unspecified CPT copyright 2019 American Medical Association. All rights reserved. The codes documented in this report are preliminary and upon coder review may  be revised to meet current compliance requirements. Midge Minium MD, MD 08/20/2020 11:30:04 AM This report has been signed electronically. Number of Addenda: 0 Note Initiated On: 08/20/2020 11:10 AM Scope Withdrawal Time: 0 hours 6 minutes 50 seconds  Total Procedure Duration: 0 hours 9  minutes 48 seconds  Estimated Blood Loss:  Estimated blood loss: none.      [facility]

## 2020-08-20 NOTE — H&P (Signed)
Midge Minium, MD [facility] [address], [address] [place], [place] [address] Phone:[phone] Fax : [phone]  Primary Care Physician:  Malva Limes, MD Primary Gastroenterologist:  Dr. Servando Snare  Pre-Procedure History & Physical: HPI:  Jesus Gardner is a 29 y.o. male is here for an colonoscopy.   Past Medical History:  Diagnosis Date  . Costochondritis 12/30/2015  . Elevated transaminase level 03/12/2016  . History of chicken pox     Past Surgical History:  Procedure Laterality Date  . None      Prior to Admission medications   Medication Sig Start Date End Date Taking? Authorizing Provider  acetaminophen (TYLENOL) 325 MG tablet Take 650 mg by mouth every 6 (six) hours as needed. PRN    [provider]  cyclobenzaprine (FLEXERIL) 5 MG tablet Take 1-2 tablets (5-10 mg total) by mouth 3 (three) times daily as needed for muscle spasms. 05/01/20   Malva Limes, MD  fexofenadine-pseudoephedrine (ALLEGRA-D ALLERGY & CONGESTION) 180-240 MG 24 hr tablet Take 1 tablet by mouth daily.    [provider]  fluticasone (FLONASE) 50 MCG/ACT nasal spray Place 2 sprays into both nostrils daily. 06/26/19   Malva Limes, MD  hyoscyamine (LEVSIN SL) 0.125 MG SL tablet Place 1 tablet (0.125 mg total) under the tongue every 4 (four) hours as needed. 07/30/20   Midge Minium, MD  Multiple Vitamin (MULTIVITAMIN) capsule Take 1 capsule by mouth daily.    [provider]  Omega-3 Fatty Acids (FISH OIL) 1000 MG CAPS Take 2 capsules by mouth 2 (two) times daily. 05/07/10   [provider]  omeprazole (PRILOSEC) 20 MG capsule Take 20 mg by mouth daily.    [provider]  tamsulosin (FLOMAX) 0.4 MG CAPS capsule Take 1 capsule (0.4 mg total) by mouth daily. 08/01/20   Malva Limes, MD  Wheat Dextrin (BENEFIBER PO) Take by mouth daily.    [provider]    Allergies as of 07/30/2020 - Review Complete 07/30/2020  Allergen Reaction Noted  .  Pollen extract  12/30/2015    Family History  Problem Relation Age of Onset  . Non-Hodgkin's lymphoma Mother 42  . Cerebrovascular Disease Father   . Diabetes Father        type 2  . CAD Father     Social History   Socioeconomic History  . Marital status: Married    Spouse name: Not on file  . Number of children: Not on file  . Years of education: Not on file  . Highest education level: Not on file  Occupational History  . 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 Smoking status: Never Smoker  . Smokeless tobacco: Never Used  Vaping Use  . Vaping Use: Never used  Substance and Sexual Activity  . Alcohol use: Yes    Alcohol/week: 0.0 standard drinks    Comment: occasionally  . Drug use: No  . Sexual activity: Not on file  Other Topics Concern  . Not on file  Social History Narrative  . Not on file   Social Determinants of Health   Financial Resource Strain:   . Difficulty of Paying Living Expenses: Not on file  Food Insecurity:   . Worried About Programme researcher, broadcasting/film/video in the Last Year: Not on file  . Ran Out of Food in the Last Year: Not on file  Transportation Needs:   . Lack  of Transportation (Medical): Not on file  . Lack of Transportation (Non-Medical): Not on file  Physical Activity:   . Days of Exercise per Week: Not on file  . Minutes of Exercise per Session: Not on file  Stress:   . Feeling of Stress : Not on file  Social Connections:   . Frequency of Communication with Friends and Family: Not on file  . Frequency of Social Gatherings with Friends and Family: Not on file  . Attends Religious Services: Not on file  . Active Member of Clubs or Organizations: Not on file  . Attends Banker Meetings: Not on file  . Marital Status: Not on file  Intimate Partner Violence:   . Fear of Current or Ex-Partner: Not on file  . Emotionally Abused: Not on file  . Physically Abused: Not on file  .  Sexually Abused: Not on file    Review of Systems: See HPI, otherwise negative ROS  Physical Exam: There were no vitals taken for this visit. General:   Alert,  pleasant and cooperative in NAD Head:  Normocephalic and atraumatic. Neck:  Supple; no masses or thyromegaly. Lungs:  Clear throughout to auscultation.    Heart:  Regular rate and rhythm. Abdomen:  Soft, nontender and nondistended. Normal bowel sounds, without guarding, and without rebound.   Neurologic:  Alert and  oriented x4;  grossly normal neurologically.  Impression/Plan: Jesus Gardner is here for an colonoscopy to be performed for rectal bleeding  Risks, benefits, limitations, and alternatives regarding  colonoscopy have been reviewed with the patient.  Questions have been answered.  All parties agreeable.   Midge Minium, MD  08/20/2020, 10:47 AM

## 2020-08-20 NOTE — Transfer of Care (Signed)
Immediate Anesthesia Transfer of Care Note  Patient: Jesus Gardner  Procedure(s) Performed: COLONOSCOPY WITH PROPOFOL (N/A )  Patient Location: PACU and Endoscopy Unit  Anesthesia Type:General  Level of Consciousness: awake  Airway & Oxygen Therapy: Patient Spontanous Breathing  Post-op Assessment: Report given to RN  Post vital signs: stable  Last Vitals:  Vitals Value Taken Time  BP 119/75 08/20/20 1132  Temp    Pulse 81 08/20/20 1133  Resp 17 08/20/20 1133  SpO2 99 % 08/20/20 1133  Vitals shown include unvalidated device data.  Last Pain:  Vitals:   08/20/20 1045  TempSrc: Temporal  PainSc: 0-No pain         Complications: No complications documented.

## 2020-08-20 NOTE — Anesthesia Preprocedure Evaluation (Signed)
Anesthesia Evaluation  Patient identified by MRN, date of birth, ID band Patient awake    Reviewed: Allergy & Precautions, NPO status , Patient's Chart, lab work & pertinent test results  Airway Mallampati: II       Dental no notable dental hx.    Pulmonary sleep apnea ,    Pulmonary exam normal breath sounds clear to auscultation       Cardiovascular negative cardio ROS Normal cardiovascular exam Rhythm:Regular Rate:Normal     Neuro/Psych  Headaches, negative psych ROS   GI/Hepatic negative GI ROS, Neg liver ROS,   Endo/Other  negative endocrine ROS  Renal/GU negative Renal ROS  negative genitourinary   Musculoskeletal negative musculoskeletal ROS (+)   Abdominal   Peds negative pediatric ROS (+)  Hematology negative hematology ROS (+)   Anesthesia Other Findings   Reproductive/Obstetrics negative OB ROS                             Anesthesia Physical Anesthesia Plan  ASA: II  Anesthesia Plan: General   Post-op Pain Management:    Induction: Intravenous  PONV Risk Score and Plan: 2 and Propofol infusion  Airway Management Planned: Nasal Cannula  Additional Equipment: None  Intra-op Plan:   Post-operative Plan:   Informed Consent: I have reviewed the patients History and Physical, chart, labs and discussed the procedure including the risks, benefits and alternatives for the proposed anesthesia with the patient or authorized representative who has indicated his/her understanding and acceptance.       Plan Discussed with: CRNA, Anesthesiologist and Surgeon  Anesthesia Plan Comments:         Anesthesia Quick Evaluation

## 2020-08-21 ENCOUNTER — Encounter: Payer: Self-pay | Admitting: Gastroenterology

## 2020-08-21 LAB — SURGICAL PATHOLOGY

## 2020-08-27 ENCOUNTER — Telehealth: Payer: Self-pay

## 2020-08-27 NOTE — Telephone Encounter (Signed)
-----   Message from Midge Minium, MD sent at 08/27/2020 12:00 PM EST ----- Let the patient know that the colonoscopy did not show any sign of active bleeding and the biopsy showed some inflammation of the small bowel but was acute and did not show any signs of chronicity.  Although this could be early inflammatory bowel disease it is more likely something acute like anti-inflammatory medication use.

## 2020-08-27 NOTE — Telephone Encounter (Signed)
Patient verbalized understanding of results  

## 2020-10-21 DIAGNOSIS — H5213 Myopia, bilateral: Secondary | ICD-10-CM | POA: Diagnosis not present

## 2021-02-07 ENCOUNTER — Other Ambulatory Visit: Payer: Self-pay

## 2021-02-07 DIAGNOSIS — Z113 Encounter for screening for infections with a predominantly sexual mode of transmission: Secondary | ICD-10-CM | POA: Diagnosis not present

## 2021-02-07 MED FILL — Cyclobenzaprine HCl Tab 5 MG: ORAL | 14 days supply | Qty: 40 | Fill #0 | Status: AC

## 2021-03-24 ENCOUNTER — Ambulatory Visit (INDEPENDENT_AMBULATORY_CARE_PROVIDER_SITE_OTHER): Payer: 59 | Admitting: Family Medicine

## 2021-03-24 ENCOUNTER — Other Ambulatory Visit: Payer: Self-pay

## 2021-03-24 ENCOUNTER — Encounter: Payer: Self-pay | Admitting: Family Medicine

## 2021-03-24 VITALS — BP 122/70 | HR 73 | Ht 75.0 in | Wt 315.0 lb

## 2021-03-24 DIAGNOSIS — Z0000 Encounter for general adult medical examination without abnormal findings: Secondary | ICD-10-CM

## 2021-03-24 MED ORDER — CYCLOBENZAPRINE HCL 5 MG PO TABS
ORAL_TABLET | ORAL | 3 refills | Status: DC
  Filled 2021-03-24: qty 40, 14d supply, fill #0
  Filled 2021-05-12: qty 40, 14d supply, fill #1
  Filled 2021-06-05: qty 40, 14d supply, fill #2
  Filled 2021-07-17: qty 40, 14d supply, fill #3

## 2021-03-24 NOTE — Progress Notes (Signed)
Complete physical exam   Patient: Jesus Gardner   DOB: [birth year]   30 y.o. Male  MRN: [id number] Visit Date: 03/24/2021  Today's healthcare provider: Lelon Huh, MD   Chief Complaint  Patient presents with   Annual Exam   Subjective    Jesus Gardner is a 30 y.o. male who presents today for a complete physical exam.  He reports consuming a general diet.  Exercises regularly.   He generally feels well. He reports sleeping well. He does not have additional problems to discuss today.  HPI  He has been working aggressive at weight loss through calorie counting and weight loss. Reflux sx have now resolved. Feeels well.  Wt Readings from Last 5 Encounters:  03/24/21 (!) 315 lb (142.9 kg)  08/20/20 (!) 323 lb 3.1 oz (146.6 kg)  07/30/20 (!) 323 lb 3.2 oz (146.6 kg)  12/29/19 (!) 329 lb 12.8 oz (149.6 kg)  11/07/19 (!) 331 lb (150.1 kg)     Past Medical History:  Diagnosis Date   Costochondritis 12/30/2015   Elevated transaminase level 03/12/2016   History of chicken pox    Past Surgical History:  Procedure Laterality Date   COLONOSCOPY WITH PROPOFOL N/A 08/20/2020   Procedure: COLONOSCOPY WITH PROPOFOL;  Surgeon: Lucilla Lame, MD;  Location: [facility] ENDOSCOPY;  Service: Endoscopy;  Laterality: N/A;   None     Social History   Socioeconomic History   Marital status: Married    Spouse name: Not on file   Number of children: Not on file   Years of education: Not on file   Highest education level: Not on file  Occupational History   Occupation: Product manager: [employer]    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[employer]  Tobacco Use   Smoking status: Never   Smokeless tobacco: Never  Vaping Use   Vaping Use: Never used  Substance and Sexual Activity   Alcohol use: Yes    Alcohol/week: 0.0 standard drinks    Comment: occasionally   Drug use: No   Sexual activity: Not on file  Other Topics Concern   Not on file  Social History Narrative   Not on file   Social Determinants  of Health   Financial Resource Strain: Not on file  Food Insecurity: Not on file  Transportation Needs: Not on file  Physical Activity: Not on file  Stress: Not on file  Social Connections: Not on file  Intimate Partner Violence: Not on file   Family Status  Relation Name Status   Mother  Deceased   Father  (Not Specified)       history of substance abuse   Sister half- sister Alive   Brother half -brother Alive   [place]   Family History  Problem Relation Age of Onset   Non-Hodgkin's lymphoma Mother 16   Cerebrovascular Disease Father    Diabetes Father        type 2   CAD Father    Prostate cancer Maternal Uncle    Allergies  Allergen Reactions   Pollen Extract     Patient Care Team: Birdie Sons, MD as PCP - General (Family Medicine)   Medications: Outpatient Medications Prior to Visit  Medication Sig   acetaminophen (TYLENOL) 325 MG tablet Take 650 mg by mouth every 6 (six) hours as needed. PRN   Coenzyme Q10 (CO Q 10 PO) Take by mouth.   cyclobenzaprine (FLEXERIL) 5 MG tablet TAKE 1  TO 2 TABLETS BY MOUTH 3 TIMES DAILY AS NEEDED FOR MUSCLE SPASMS.   fexofenadine-pseudoephedrine (ALLEGRA-D 24) 180-240 MG 24 hr tablet Take 1 tablet by mouth daily.   fluticasone (FLONASE) 50 MCG/ACT nasal spray Place 2 sprays into both nostrils daily.   hyoscyamine (ANASPAZ) 0.125 MG TBDP disintergrating tablet PLACE 1 TABLET (0.125 MG TOTAL) UNDER THE TONGUE EVERY 4 (FOUR) HOURS AS NEEDED.   hyoscyamine (LEVSIN SL) 0.125 MG SL tablet Place 1 tablet (0.125 mg total) under the tongue every 4 (four) hours as needed.   Multiple Vitamin (MULTIVITAMIN) capsule Take 1 capsule by mouth daily.   Omega-3 Fatty Acids (FISH OIL) 1000 MG CAPS Take 2 capsules by mouth 2 (two) times daily.   omeprazole (PRILOSEC) 20 MG capsule Take 20 mg by mouth daily.   Probiotic Product (PROBIOTIC-10 PO) Take by mouth.   tamsulosin (FLOMAX) 0.4 MG CAPS capsule TAKE 1 CAPSULE (0.4 MG  TOTAL) BY MOUTH DAILY.   Wheat Dextrin (BENEFIBER PO) Take by mouth daily.   polyethylene glycol-electrolytes (NULYTELY) 420 g solution TAKE 4,000 MLS BY MOUTH ONCE FOR 1 DOSE. DRINK ONE 8 OZ. GLASS OF MIXTURE EVERY 20-30 MINUTES UNTIL YOU FINISH THE LIQUID JUG.   No facility-administered medications prior to visit.    Review of Systems  Constitutional: Negative.   HENT:  Negative for congestion.   Eyes: Negative.   Respiratory: Negative.    Cardiovascular: Negative.   Gastrointestinal:  Positive for constipation and diarrhea. Negative for abdominal distention, abdominal pain, anal bleeding, blood in stool, nausea, rectal pain and vomiting.  Endocrine: Negative.   Genitourinary:  Positive for flank pain. Negative for decreased urine volume, difficulty urinating, dysuria, enuresis, frequency, genital sores, hematuria, penile discharge, penile pain, penile swelling, scrotal swelling, testicular pain and urgency.  Musculoskeletal:  Positive for arthralgias. Negative for back pain, gait problem, joint swelling, myalgias, neck pain and neck stiffness.  Skin: Negative.   Allergic/Immunologic: Negative.   Neurological: Negative.   Hematological: Negative.   Psychiatric/Behavioral: Negative.       Objective    BP 122/70 (BP Location: Right Arm, Patient Position: Sitting, Cuff Size: Large)   Pulse 73   Ht '6\' 3"'  (1.905 m)   Wt (!) 315 lb (142.9 kg)   BMI 39.37 kg/m    Physical Exam   General Appearance:    Obese male. Alert, cooperative, in no acute distress, appears stated age  Head:    Normocephalic, without obvious abnormality, atraumatic  Eyes:    PERRL, conjunctiva/corneas clear, EOM's intact, fundi    benign, both eyes       Ears:    Normal TM's and external ear canals, both ears  Nose:   Nares normal, septum midline, mucosa normal, no drainage   or sinus tenderness  Throat:   Lips, mucosa, and tongue normal; teeth and gums normal  Neck:   Supple, symmetrical, trachea midline,  no adenopathy;       thyroid:  No enlargement/tenderness/nodules; no carotid   bruit or JVD  Back:     Symmetric, no curvature, ROM normal, no CVA tenderness  Lungs:     Clear to auscultation bilaterally, respirations unlabored  Chest wall:    No tenderness or deformity  Heart:    Normal heart rate. Normal rhythm. No murmurs, rubs, or gallops.  S1 and S2 normal  Abdomen:     Soft, non-tender, bowel sounds active all four quadrants,    no masses, no organomegaly  Genitalia:    deferred  Rectal:  deferred  Extremities:   All extremities are intact. No cyanosis or edema  Pulses:   2+ and symmetric all extremities  Skin:   Skin color, texture, turgor normal, no rashes or lesions  Lymph nodes:   Cervical, supraclavicular, and axillary nodes normal  Neurologic:   CNII-XII intact. Normal strength, sensation and reflexes      throughout     Last depression screening scores PHQ 2/9 Scores 03/24/2021 12/29/2019 11/16/2018  PHQ - 2 Score '1 2 1  ' PHQ- 9 Score '3 3 3   ' Last fall risk screening Fall Risk  03/24/2021  Falls in the past year? 0  Number falls in past yr: 0  Injury with Fall? 0  Risk for fall due to : No Fall Risks  Follow up Falls evaluation completed   Last Audit-C alcohol use screening Alcohol Use Disorder Test (AUDIT) 03/24/2021  1. How often do you have a drink containing alcohol? 2  2. How many drinks containing alcohol do you have on a typical day when you are drinking? 1  3. How often do you have six or more drinks on one occasion? 0  AUDIT-C Score 3   A score of 3 or more in women, and 4 or more in men indicates increased risk for alcohol abuse, EXCEPT if all of the points are from question 1   No results found for any visits on 03/24/21.  Assessment & Plan    Routine Health Maintenance and Physical Exam  Exercise Activities and Dietary recommendations  Goals   None     Immunization History  Administered Date(s) Administered   DTaP 12/18/1988, 02/16/1989,  04/26/1989, 04/29/1990, 02/13/1993   Hepatitis B 08/06/2000, 09/10/2000, 02/11/2001   HiB (PRP-OMP) 10/22/1989, 01/28/1990   IPV 12/18/1988, 02/16/1989, 04/29/1990, 02/13/1993   Influenza,inj,Quad PF,6+ Mos 11/16/2018   Influenza-Unspecified 07/28/2019, 07/17/2020   MMR 01/28/1990, 02/13/1993   Td 03/24/2006   Tdap 04/26/2012    Health Maintenance  Topic Date Due   COVID-19 Vaccine (1) Never done   INFLUENZA VACCINE  05/12/2021   TETANUS/TDAP  04/26/2022   Zoster Vaccines- Shingrix (1 of 2) 10/20/2038   Hepatitis C Screening  Completed   HIV Screening  Completed   Pneumococcal Vaccine 74-82 Years old  Aged Out   HPV VACCINES  Aged Out    Discussed health benefits of physical activity, and encouraged him to engage in regular exercise appropriate for his age and condition.  1. Annual physical exam  - Comprehensive metabolic panel - Lipid panel  2. Morbid obesity ([place]) Doing well with current diet and calorie counting with goal to get weight at least under 300.   3. Recurrent back and shoulder pain Doing better with weight loss, but still has intermittently. Does well with message therapy and occasional use of cyclobenzaprine which was refilled today.   DAILY AS NEEDED FOR MUSCLE SPASMS.  Dispense: 40 tablet; Refill: 3      The entirety of the information documented in the History of Present Illness, Review of Systems and Physical Exam were personally obtained by me. Portions of this information were initially documented by the CMA and reviewed by me for thoroughness and accuracy.     Lelon Huh, MD  [facility] [phone] (phone) [phone] (fax)  [place]

## 2021-03-25 LAB — LIPID PANEL
Chol/HDL Ratio: 4.6 ratio (ref 0.0–5.0)
Cholesterol, Total: 183 mg/dL (ref 100–199)
HDL: 40 mg/dL (ref >39)
LDL Chol Calc (NIH): 116 mg/dL — ABNORMAL HIGH (ref 0–99)
Triglycerides: 149 mg/dL (ref 0–149)
VLDL Cholesterol Cal: 27 mg/dL (ref 5–40)

## 2021-03-25 LAB — COMPREHENSIVE METABOLIC PANEL
ALT: 27 IU/L (ref 0–44)
AST: 21 IU/L (ref 0–40)
Albumin/Globulin Ratio: 2.2 (ref 1.2–2.2)
Albumin: 4.7 g/dL (ref 4.0–5.0)
Alkaline Phosphatase: 52 IU/L (ref 44–121)
BUN/Creatinine Ratio: 14 (ref 9–20)
BUN: 11 mg/dL (ref 6–20)
Bilirubin Total: 0.6 mg/dL (ref 0.0–1.2)
CO2: 24 mmol/L (ref 20–29)
Calcium: 9.6 mg/dL (ref 8.7–10.2)
Chloride: 104 mmol/L (ref 96–106)
Creatinine, Ser: 0.8 mg/dL (ref 0.76–1.27)
Globulin, Total: 2.1 g/dL (ref 1.5–4.5)
Glucose: 91 mg/dL (ref 65–99)
Potassium: 4.4 mmol/L (ref 3.5–5.2)
Sodium: 140 mmol/L (ref 134–144)
Total Protein: 6.8 g/dL (ref 6.0–8.5)
eGFR: 121 mL/min/{1.73_m2} (ref >59)

## 2021-05-12 ENCOUNTER — Other Ambulatory Visit: Payer: Self-pay

## 2021-06-06 ENCOUNTER — Other Ambulatory Visit: Payer: Self-pay

## 2021-07-09 ENCOUNTER — Ambulatory Visit: Payer: Self-pay

## 2021-07-09 ENCOUNTER — Other Ambulatory Visit: Payer: Self-pay

## 2021-07-09 DIAGNOSIS — Z23 Encounter for immunization: Secondary | ICD-10-CM

## 2021-07-17 ENCOUNTER — Other Ambulatory Visit: Payer: Self-pay

## 2021-08-21 ENCOUNTER — Other Ambulatory Visit: Payer: Self-pay | Admitting: Family Medicine

## 2021-08-21 ENCOUNTER — Other Ambulatory Visit: Payer: Self-pay | Admitting: Gastroenterology

## 2021-08-21 ENCOUNTER — Other Ambulatory Visit: Payer: Self-pay

## 2021-08-21 DIAGNOSIS — M6289 Other specified disorders of muscle: Secondary | ICD-10-CM

## 2021-08-21 MED ORDER — CYCLOBENZAPRINE HCL 5 MG PO TABS
ORAL_TABLET | ORAL | 3 refills | Status: DC
  Filled 2021-08-21: qty 40, 14d supply, fill #0
  Filled 2021-09-29: qty 40, 14d supply, fill #1
  Filled 2021-11-05: qty 40, 14d supply, fill #2
  Filled 2021-12-18: qty 40, 14d supply, fill #3

## 2021-08-21 MED ORDER — TAMSULOSIN HCL 0.4 MG PO CAPS
ORAL_CAPSULE | Freq: Every day | ORAL | 3 refills | Status: AC
  Filled 2021-08-21: qty 90, 90d supply, fill #0
  Filled 2022-07-13: qty 90, 90d supply, fill #1

## 2021-08-22 ENCOUNTER — Other Ambulatory Visit: Payer: Self-pay | Admitting: Gastroenterology

## 2021-08-22 ENCOUNTER — Other Ambulatory Visit: Payer: Self-pay

## 2021-08-25 ENCOUNTER — Other Ambulatory Visit: Payer: Self-pay

## 2021-08-27 ENCOUNTER — Other Ambulatory Visit: Payer: Self-pay

## 2021-08-29 ENCOUNTER — Other Ambulatory Visit: Payer: Self-pay

## 2021-09-03 ENCOUNTER — Other Ambulatory Visit: Payer: Self-pay

## 2021-09-08 ENCOUNTER — Other Ambulatory Visit: Payer: Self-pay

## 2021-09-09 ENCOUNTER — Other Ambulatory Visit: Payer: Self-pay

## 2021-09-11 ENCOUNTER — Other Ambulatory Visit: Payer: Self-pay

## 2021-09-11 MED FILL — Hyoscyamine Sulfate Tab Disint 0.125 MG: ORAL | 5 days supply | Qty: 30 | Fill #0 | Status: AC

## 2021-09-12 ENCOUNTER — Other Ambulatory Visit: Payer: Self-pay

## 2021-09-14 ENCOUNTER — Encounter: Payer: Self-pay | Admitting: Family Medicine

## 2021-09-14 DIAGNOSIS — F40243 Fear of flying: Secondary | ICD-10-CM

## 2021-09-15 ENCOUNTER — Other Ambulatory Visit: Payer: Self-pay

## 2021-09-15 MED ORDER — ALPRAZOLAM 0.5 MG PO TABS
ORAL_TABLET | ORAL | 0 refills | Status: AC
  Filled 2021-09-15: qty 10, 3d supply, fill #0

## 2021-09-16 ENCOUNTER — Other Ambulatory Visit: Payer: Self-pay

## 2021-09-16 DIAGNOSIS — Z1159 Encounter for screening for other viral diseases: Secondary | ICD-10-CM | POA: Diagnosis not present

## 2021-09-29 ENCOUNTER — Other Ambulatory Visit: Payer: Self-pay

## 2021-10-01 ENCOUNTER — Other Ambulatory Visit: Payer: Self-pay | Admitting: Nurse Practitioner

## 2021-10-01 DIAGNOSIS — R6889 Other general symptoms and signs: Secondary | ICD-10-CM | POA: Diagnosis not present

## 2021-10-01 DIAGNOSIS — Z1379 Encounter for other screening for genetic and chromosomal anomalies: Secondary | ICD-10-CM | POA: Diagnosis not present

## 2021-10-01 DIAGNOSIS — Z1589 Genetic susceptibility to other disease: Secondary | ICD-10-CM | POA: Diagnosis not present

## 2021-10-01 DIAGNOSIS — D899 Disorder involving the immune mechanism, unspecified: Secondary | ICD-10-CM | POA: Diagnosis not present

## 2021-10-02 DIAGNOSIS — Z31440 Encounter of male for testing for genetic disease carrier status for procreative management: Secondary | ICD-10-CM | POA: Diagnosis not present

## 2021-10-27 LAB — HLA A,B,DRB1,3,4,5 (IR)

## 2021-10-27 LAB — CHROMOSOME, BLOOD, ROUTINE
Cells Analyzed: 20
Cells Counted: 20
Cells Karyotyped: 2
GTG Band Resolution Achieved: 500

## 2021-10-27 LAB — HLA-C (HR)

## 2021-10-27 LAB — HLA DQA1, DQB1 (IR)

## 2021-11-06 ENCOUNTER — Other Ambulatory Visit: Payer: Self-pay

## 2021-11-30 DIAGNOSIS — Z20822 Contact with and (suspected) exposure to covid-19: Secondary | ICD-10-CM | POA: Diagnosis not present

## 2021-12-18 ENCOUNTER — Other Ambulatory Visit: Payer: Self-pay

## 2022-01-22 ENCOUNTER — Other Ambulatory Visit: Payer: Self-pay | Admitting: Family Medicine

## 2022-01-23 ENCOUNTER — Other Ambulatory Visit: Payer: Self-pay

## 2022-01-23 MED ORDER — CYCLOBENZAPRINE HCL 5 MG PO TABS
ORAL_TABLET | ORAL | 2 refills | Status: DC
  Filled 2022-01-23: qty 40, 14d supply, fill #0
  Filled 2022-02-11: qty 40, 14d supply, fill #1
  Filled 2022-03-18: qty 40, 14d supply, fill #2

## 2022-02-11 ENCOUNTER — Other Ambulatory Visit: Payer: Self-pay

## 2022-03-19 ENCOUNTER — Other Ambulatory Visit: Payer: Self-pay

## 2022-04-24 ENCOUNTER — Other Ambulatory Visit: Payer: Self-pay

## 2022-04-24 ENCOUNTER — Ambulatory Visit (INDEPENDENT_AMBULATORY_CARE_PROVIDER_SITE_OTHER): Payer: 59 | Admitting: Family Medicine

## 2022-04-24 ENCOUNTER — Encounter: Payer: Self-pay | Admitting: Family Medicine

## 2022-04-24 VITALS — BP 116/72 | HR 75 | Temp 98.3°F | Resp 16 | Ht 75.0 in | Wt 323.1 lb

## 2022-04-24 DIAGNOSIS — Z0000 Encounter for general adult medical examination without abnormal findings: Secondary | ICD-10-CM

## 2022-04-24 DIAGNOSIS — Z23 Encounter for immunization: Secondary | ICD-10-CM

## 2022-04-24 DIAGNOSIS — Z6841 Body Mass Index (BMI) 40.0 and over, adult: Secondary | ICD-10-CM | POA: Diagnosis not present

## 2022-04-24 MED ORDER — PHENTERMINE HCL 15 MG PO CAPS
15.0000 mg | ORAL_CAPSULE | ORAL | 2 refills | Status: DC
  Filled 2022-04-24: qty 30, 30d supply, fill #0
  Filled 2022-05-25: qty 30, 30d supply, fill #1
  Filled 2022-06-26: qty 30, 30d supply, fill #2

## 2022-04-24 NOTE — Progress Notes (Signed)
Complete physical exam  I,Joseline E Rosas,acting as a scribe for Lelon Huh, MD.,have documented all relevant documentation on the behalf of Lelon Huh, MD,as directed by  Lelon Huh, MD while in the presence of Lelon Huh, MD.   Patient: Jesus Gardner   DOB: 26-Apr-1991   31 y.o. Male  MRN: [id number] Visit Date: 04/24/2022  Today's healthcare provider: Lelon Huh, MD   Chief Complaint  Patient presents with   Annual Exam   Subjective    Jesus Gardner is a 31 y.o. male who presents today for a complete physical exam.  He reports consuming a  calorie count  diet. Home exercise routine includes yoga and weight. He generally feels well. He reports sleeping well. He does not have additional problems to discuss today.  HPI  Follow up for morbid obesity:  The patient was last seen for this 1  year  ago. Changes made at last visit include none; continue lifestyle modification. He has put on a little weight back on since his grandmother has been ill and not able to devote as much effort to calorie counting. Otherwise he feels well. No longer takes omeprazole for reflux, but takes OTC Pepcid AC occasionally if he eats the wrong foods, which remains effective. Rarely take tamsulosin if he gets a flank pain.  -----------------------------------------------------------------------------------------    Past Medical History:  Diagnosis Date   Costochondritis 12/30/2015   Elevated transaminase level 03/12/2016   History of chicken pox    Past Surgical History:  Procedure Laterality Date   COLONOSCOPY WITH PROPOFOL N/A 08/20/2020   Procedure: COLONOSCOPY WITH PROPOFOL;  Surgeon: Lucilla Lame, MD;  Location: [facility] ENDOSCOPY;  Service: Endoscopy;  Laterality: N/A;   None     Social History   Socioeconomic History   Marital status: Married    Spouse name: Not on file   Number of children: Not on file   Years of education: Not on file   Highest education level: Not on  file  Occupational History   Occupation: Product manager: [employer]    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 [employer]  Tobacco Use   Smoking status: Never   Smokeless tobacco: Never  Vaping Use   Vaping Use: Never used  Substance and Sexual Activity   Alcohol use: Yes    Alcohol/week: 0.0 standard drinks of alcohol    Comment: occasionally   Drug use: No   Sexual activity: Not on file  Other Topics Concern   Not on file  Social History Narrative   Not on file   Social Determinants of Health   Financial Resource Strain: Not on file  Food Insecurity: Not on file  Transportation Needs: Not on file  Physical Activity: Not on file  Stress: Not on file  Social Connections: Not on file  Intimate Partner Violence: Not on file   Family Status  Relation Name Status   Mother  Deceased   Father  (Not Specified)       history of substance abuse   Sister half- sister Alive   Brother half -brother Alive   Mat Uncle Great-Uncle Alive   Family History  Problem Relation Age of Onset   Non-Hodgkin's lymphoma Mother 11   Cerebrovascular Disease Father    Diabetes Father        type 2   CAD Father    Prostate cancer Maternal Uncle    Allergies  Allergen Reactions   Pollen Extract  Patient Care Team: Birdie Sons, MD as PCP - General (Family Medicine)   Medications: Outpatient Medications Prior to Visit  Medication Sig   acetaminophen (TYLENOL) 325 MG tablet Take 650 mg by mouth every 6 (six) hours as needed. PRN   ALPRAZolam (XANAX) 0.5 MG tablet One tablet at least 30 minutes before flying, and every four hours as needed   Coenzyme Q10 (CO Q 10 PO) Take by mouth.   cyclobenzaprine (FLEXERIL) 5 MG tablet TAKE 1 TO 2 TABLETS BY MOUTH 3 TIMES DAILY AS NEEDED FOR MUSCLE SPASMS.   fexofenadine-pseudoephedrine (ALLEGRA-D 24) 180-240 MG 24 hr tablet Take 1 tablet by mouth daily.   fluticasone (FLONASE) 50 MCG/ACT nasal spray Place 2 sprays into both nostrils daily.   hyoscyamine  (ANASPAZ) 0.125 MG TBDP disintergrating tablet PLACE 1 TABLET (0.125 MG TOTAL) UNDER THE TONGUE EVERY 4 (FOUR) HOURS AS NEEDED.   Multiple Vitamin (MULTIVITAMIN) capsule Take 1 capsule by mouth daily.   Omega-3 Fatty Acids (FISH OIL) 1000 MG CAPS Take 2 capsules by mouth 2 (two) times daily.   Probiotic Product (PROBIOTIC-10 PO) Take by mouth.   tamsulosin (FLOMAX) 0.4 MG CAPS capsule TAKE 1 CAPSULE (0.4 MG TOTAL) BY MOUTH DAILY. (Patient taking differently: Take 0.4 mg by mouth daily as needed.)   Wheat Dextrin (BENEFIBER PO) Take by mouth daily.   omeprazole (PRILOSEC) 20 MG capsule Take 20 mg by mouth daily. (Patient not taking: Reported on 04/24/2022)   [DISCONTINUED] hyoscyamine (LEVSIN SL) 0.125 MG SL tablet Place 1 tablet (0.125 mg total) under the tongue every 4 (four) hours as needed.   No facility-administered medications prior to visit.    Review of Systems  Constitutional: Negative.   HENT:  Positive for congestion and postnasal drip.   Eyes: Negative.   Respiratory: Negative.    Cardiovascular: Negative.   Gastrointestinal:  Positive for abdominal pain.  Endocrine: Negative.   Genitourinary:  Positive for flank pain.  Musculoskeletal:  Positive for myalgias, neck pain and neck stiffness.  Skin: Negative.   Allergic/Immunologic: Negative.   Neurological: Negative.   Hematological: Negative.   Psychiatric/Behavioral: Negative.         Objective     BP 116/72 (BP Location: Left Arm, Patient Position: Sitting, Cuff Size: Large)   Pulse 75   Temp 98.3 F (36.8 C) (Oral)   Resp 16   Ht '6\' 3"'  (1.905 m)   Wt (!) 323 lb 1.6 oz (146.6 kg)   BMI 40.38 kg/m     Physical Exam   General Appearance:    Obese male. Alert, cooperative, in no acute distress, appears stated age  Head:    Normocephalic, without obvious abnormality, atraumatic  Eyes:    PERRL, conjunctiva/corneas clear, EOM's intact, fundi    benign, both eyes       Ears:    Normal TM's and external ear  canals, both ears  Nose:   Nares normal, septum midline, mucosa normal, no drainage   or sinus tenderness  Throat:   Lips, mucosa, and tongue normal; teeth and gums normal  Neck:   Supple, symmetrical, trachea midline, no adenopathy;       thyroid:  No enlargement/tenderness/nodules; no carotid   bruit or JVD  Back:     Symmetric, no curvature, ROM normal, no CVA tenderness  Lungs:     Clear to auscultation bilaterally, respirations unlabored  Chest wall:    No tenderness or deformity  Heart:    Normal heart rate. Normal rhythm.  No murmurs, rubs, or gallops.  S1 and S2 normal  Abdomen:     Soft, non-tender, bowel sounds active all four quadrants,    no masses, no organomegaly  Genitalia:    deferred  Rectal:    deferred  Extremities:   All extremities are intact. No cyanosis or edema  Pulses:   2+ and symmetric all extremities  Skin:   Skin color, texture, turgor normal, no rashes or lesions  Lymph nodes:   Cervical, supraclavicular, and axillary nodes normal  Neurologic:   CNII-XII intact. Normal strength, sensation and reflexes      throughout     Last depression screening scores    04/24/2022    9:16 AM 03/24/2021    1:36 PM 12/29/2019   10:03 AM  PHQ 2/9 Scores  PHQ - 2 Score '2 1 2  ' PHQ- 9 Score '5 3 3   ' Last fall risk screening    04/24/2022    9:16 AM  [place] in the past year? 0  Number falls in past yr: 0  Injury with Fall? 0   Last Audit-C alcohol use screening    04/24/2022    9:17 AM  Alcohol Use Disorder Test (AUDIT)  1. How often do you have a drink containing alcohol? 1  2. How many drinks containing alcohol do you have on a typical day when you are drinking? 0  3. How often do you have six or more drinks on one occasion? 0  AUDIT-C Score 1   A score of 3 or more in women, and 4 or more in men indicates increased risk for alcohol abuse, EXCEPT if all of the points are from question 1   No results found for any visits on 04/24/22.  Assessment &  Plan    Routine Health Maintenance and Physical Exam  Exercise Activities and Dietary recommendations  Goals   None     Immunization History  Administered Date(s) Administered   DTaP 12/18/1988, 02/16/1989, 04/26/1989, 04/29/1990, 02/13/1993   Hepatitis B 08/06/2000, 09/10/2000, 02/11/2001   HiB (PRP-OMP) 10/22/1989, 01/28/1990   IPV 12/18/1988, 02/16/1989, 04/29/1990, 02/13/1993   Influenza,inj,Quad PF,6+ Mos 11/16/2018, 07/09/2021   Influenza-Unspecified 07/28/2019, 07/17/2020   MMR 01/28/1990, 02/13/1993   Td 03/24/2006   Tdap 04/26/2012    Health Maintenance  Topic Date Due   COVID-19 Vaccine (1) Never done   TETANUS/TDAP  04/26/2022   INFLUENZA VACCINE  05/12/2022   Hepatitis C Screening  Completed   HIV Screening  Completed   HPV VACCINES  Aged Out    Discussed health benefits of physical activity, and encouraged him to engage in regular exercise appropriate for his age and condition.  1. Annual physical exam  - CBC - Comprehensive metabolic panel - Lipid panel  2. Need for prophylactic vaccination using tetanus and diphtheria toxoids adsorbed (Td) vaccine  - Tdap vaccine greater than or equal to 7yo IM  3. Class 3 severe obesity without serious comorbidity with body mass index (BMI) of 40.0 to 44.9 in adult, unspecified obesity type ([place]) Has done well with calorie counting with plateued with BMI still above 40. Discussed various medications for weight loss. Will try  phentermine 15 MG capsule; Take 1 capsule (15 mg total) by mouth every morning.  Dispense: 30 capsule; Refill: 2    Future Appointments  Date Time Provider [place]  07/13/2022 11:00 AM Caryn Section, Kirstie Peri, MD [facility]      The entirety of the information documented  in the History of Present Illness, Review of Systems and Physical Exam were personally obtained by me. Portions of this information were initially documented by the CMA and reviewed by me for thoroughness and accuracy.      Lelon Huh, MD  [facility] [phone] (phone) [phone] (fax)  [place]

## 2022-04-27 ENCOUNTER — Other Ambulatory Visit: Payer: Self-pay | Admitting: Family Medicine

## 2022-04-28 ENCOUNTER — Other Ambulatory Visit: Payer: Self-pay | Admitting: Family Medicine

## 2022-04-28 ENCOUNTER — Other Ambulatory Visit: Payer: Self-pay

## 2022-04-28 DIAGNOSIS — M62838 Other muscle spasm: Secondary | ICD-10-CM

## 2022-04-28 DIAGNOSIS — Z0000 Encounter for general adult medical examination without abnormal findings: Secondary | ICD-10-CM | POA: Diagnosis not present

## 2022-04-29 LAB — COMPREHENSIVE METABOLIC PANEL
ALT: 23 IU/L (ref 0–44)
AST: 19 IU/L (ref 0–40)
Albumin/Globulin Ratio: 2 (ref 1.2–2.2)
Albumin: 4.6 g/dL (ref 4.1–5.1)
Alkaline Phosphatase: 58 IU/L (ref 44–121)
BUN/Creatinine Ratio: 13 (ref 9–20)
BUN: 11 mg/dL (ref 6–20)
Bilirubin Total: 0.5 mg/dL (ref 0.0–1.2)
CO2: 23 mmol/L (ref 20–29)
Calcium: 9.7 mg/dL (ref 8.7–10.2)
Chloride: 105 mmol/L (ref 96–106)
Creatinine, Ser: 0.83 mg/dL (ref 0.76–1.27)
Globulin, Total: 2.3 g/dL (ref 1.5–4.5)
Glucose: 95 mg/dL (ref 70–99)
Potassium: 4.6 mmol/L (ref 3.5–5.2)
Sodium: 143 mmol/L (ref 134–144)
Total Protein: 6.9 g/dL (ref 6.0–8.5)
eGFR: 119 mL/min/{1.73_m2} (ref >59)

## 2022-04-29 LAB — CBC
Hematocrit: 43.6 % (ref 37.5–51.0)
Hemoglobin: 14.4 g/dL (ref 13.0–17.7)
MCH: 29 pg (ref 26.6–33.0)
MCHC: 33 g/dL (ref 31.5–35.7)
MCV: 88 fL (ref 79–97)
Platelets: 243 10*3/uL (ref 150–450)
RBC: 4.96 x10E6/uL (ref 4.14–5.80)
RDW: 13.2 % (ref 11.6–15.4)
WBC: 5.7 10*3/uL (ref 3.4–10.8)

## 2022-04-29 LAB — LIPID PANEL
Chol/HDL Ratio: 5.5 ratio — ABNORMAL HIGH (ref 0.0–5.0)
Cholesterol, Total: 192 mg/dL (ref 100–199)
HDL: 35 mg/dL — ABNORMAL LOW (ref >39)
LDL Chol Calc (NIH): 115 mg/dL — ABNORMAL HIGH (ref 0–99)
Triglycerides: 240 mg/dL — ABNORMAL HIGH (ref 0–149)
VLDL Cholesterol Cal: 42 mg/dL — ABNORMAL HIGH (ref 5–40)

## 2022-04-29 NOTE — Telephone Encounter (Signed)
Requested medication (s) are due for refill today: yes  Requested medication (s) are on the active medication list: yes  Last refill:  01/23/22 #40 with 2 RF  Future visit scheduled: 07/13/22, seen 04/24/22  Notes to clinic:  This is not a duplicate, if sent yesterday it  was not received by pharm. Med is not delegated, please assess.      Requested Prescriptions  Pending Prescriptions Disp Refills   cyclobenzaprine (FLEXERIL) 5 MG tablet 40 tablet 2    Sig: TAKE 1 TO 2 TABLETS BY MOUTH 3 TIMES DAILY AS NEEDED FOR MUSCLE SPASMS.     Not Delegated - Analgesics:  Muscle Relaxants Failed - 04/28/2022  5:42 PM      Failed - This refill cannot be delegated      Passed - Valid encounter within last 6 months    Recent Outpatient Visits           5 days ago Annual physical exam   [facility] Malva Limes, MD   1 year ago Annual physical exam   [facility] Malva Limes, MD   2 years ago Annual physical exam   [facility] Malva Limes, MD   2 years ago Subacromial bursitis of left shoulder joint   [facility] Malva Limes, MD   3 years ago Radiculopathy, unspecified spinal region   [facility] Fisher, Demetrios Isaacs, MD       Future Appointments             In 2 months Fisher, Demetrios Isaacs, MD [facility]

## 2022-04-30 ENCOUNTER — Other Ambulatory Visit: Payer: Self-pay

## 2022-04-30 MED FILL — Cyclobenzaprine HCl Tab 5 MG: ORAL | 14 days supply | Qty: 40 | Fill #0 | Status: AC

## 2022-05-02 ENCOUNTER — Other Ambulatory Visit: Payer: Self-pay

## 2022-05-04 ENCOUNTER — Other Ambulatory Visit: Payer: Self-pay

## 2022-05-22 ENCOUNTER — Other Ambulatory Visit: Payer: Self-pay

## 2022-05-22 MED FILL — Cyclobenzaprine HCl Tab 5 MG: ORAL | 14 days supply | Qty: 40 | Fill #1 | Status: AC

## 2022-05-25 ENCOUNTER — Other Ambulatory Visit: Payer: Self-pay

## 2022-06-12 ENCOUNTER — Other Ambulatory Visit: Payer: Self-pay

## 2022-06-12 ENCOUNTER — Other Ambulatory Visit: Payer: Self-pay | Admitting: Gastroenterology

## 2022-06-12 ENCOUNTER — Encounter: Payer: Self-pay | Admitting: Family Medicine

## 2022-06-12 DIAGNOSIS — K5790 Diverticulosis of intestine, part unspecified, without perforation or abscess without bleeding: Secondary | ICD-10-CM

## 2022-06-16 ENCOUNTER — Other Ambulatory Visit: Payer: Self-pay

## 2022-06-16 MED ORDER — HYOSCYAMINE SULFATE 0.125 MG PO TBDP
ORAL_TABLET | ORAL | 1 refills | Status: DC
  Filled 2022-06-16: qty 30, 5d supply, fill #0

## 2022-06-17 ENCOUNTER — Other Ambulatory Visit: Payer: Self-pay

## 2022-06-17 MED FILL — Cyclobenzaprine HCl Tab 5 MG: ORAL | 14 days supply | Qty: 40 | Fill #2 | Status: AC

## 2022-06-26 ENCOUNTER — Other Ambulatory Visit: Payer: Self-pay

## 2022-06-29 DIAGNOSIS — H5213 Myopia, bilateral: Secondary | ICD-10-CM | POA: Diagnosis not present

## 2022-07-06 ENCOUNTER — Other Ambulatory Visit: Payer: Self-pay | Admitting: Family Medicine

## 2022-07-06 ENCOUNTER — Other Ambulatory Visit: Payer: Self-pay

## 2022-07-06 DIAGNOSIS — M62838 Other muscle spasm: Secondary | ICD-10-CM

## 2022-07-06 MED ORDER — CYCLOBENZAPRINE HCL 5 MG PO TABS
ORAL_TABLET | ORAL | 2 refills | Status: DC
  Filled 2022-07-06: qty 40, 14d supply, fill #0

## 2022-07-06 NOTE — Telephone Encounter (Signed)
Last refill: 04/30/2022 # 40 with 2 refills  Last office visit: 04/24/2022 Next ov: 07/13/2022

## 2022-07-13 ENCOUNTER — Ambulatory Visit: Payer: 59 | Admitting: Family Medicine

## 2022-07-13 ENCOUNTER — Other Ambulatory Visit: Payer: Self-pay

## 2022-07-13 ENCOUNTER — Encounter: Payer: Self-pay | Admitting: Family Medicine

## 2022-07-13 VITALS — BP 129/80 | HR 84 | Temp 98.8°F | Resp 16 | Ht 75.0 in | Wt 314.0 lb

## 2022-07-13 DIAGNOSIS — K625 Hemorrhage of anus and rectum: Secondary | ICD-10-CM

## 2022-07-13 DIAGNOSIS — K5790 Diverticulosis of intestine, part unspecified, without perforation or abscess without bleeding: Secondary | ICD-10-CM

## 2022-07-13 DIAGNOSIS — R109 Unspecified abdominal pain: Secondary | ICD-10-CM

## 2022-07-13 DIAGNOSIS — Z23 Encounter for immunization: Secondary | ICD-10-CM

## 2022-07-13 DIAGNOSIS — Z6841 Body Mass Index (BMI) 40.0 and over, adult: Secondary | ICD-10-CM

## 2022-07-13 DIAGNOSIS — M542 Cervicalgia: Secondary | ICD-10-CM

## 2022-07-13 MED ORDER — METHOCARBAMOL 750 MG PO TABS
750.0000 mg | ORAL_TABLET | Freq: Three times a day (TID) | ORAL | 3 refills | Status: DC | PRN
  Filled 2022-07-13: qty 60, 10d supply, fill #0
  Filled 2022-08-13: qty 60, 10d supply, fill #1
  Filled 2022-09-07: qty 60, 10d supply, fill #2
  Filled 2022-10-06: qty 60, 10d supply, fill #3

## 2022-07-13 MED ORDER — DICYCLOMINE HCL 10 MG PO CAPS
10.0000 mg | ORAL_CAPSULE | Freq: Three times a day (TID) | ORAL | 3 refills | Status: DC | PRN
  Filled 2022-07-13: qty 30, 10d supply, fill #0
  Filled 2022-08-13: qty 30, 10d supply, fill #1
  Filled 2022-09-07: qty 30, 10d supply, fill #2
  Filled 2022-09-28: qty 30, 10d supply, fill #3

## 2022-07-13 NOTE — Patient Instructions (Signed)
.   Please review the attached list of medications and notify my office if there are any errors.   . Please bring all of your medications to every appointment so we can make sure that our medication list is the same as yours.   

## 2022-07-13 NOTE — Progress Notes (Signed)
I,Jesus Gardner,acting as a scribe for Mila Merry, MD.,have documented all relevant documentation on the behalf of Mila Merry, MD,as directed by  Mila Merry, MD while in the presence of Mila Merry, MD.   Established patient visit   Patient: Jesus Gardner   DOB: Sep 03, 1991   31 y.o. Male  MRN: [id number] Visit Date: 07/13/2022  Today's healthcare provider: Mila Merry, MD   Chief Complaint  Patient presents with   Obesity   Subjective    HPI  Follow up for Obesity  The patient was last seen for this 3 months ago. Changes made at last visit include try  phentermine 15 MG capsule.  He reports good compliance with treatment. He feels that condition is Improved. He is not having side effects.   Wt Readings from Last 3 Encounters:  07/13/22 (!) 314 lb (142.4 kg)  04/24/22 (!) 323 lb 1.6 oz (146.6 kg)  03/24/21 (!) 315 lb (142.9 kg)   -----------------------------------------------------------------------------------------   He also reports more frequent flares of diverticular symptoms, particularly cramping and blood in stools several times a week. He had been taking hyoscyamine prn, but this no longer seems to be effective for cramping.    Medications: Outpatient Medications Prior to Visit  Medication Sig   acetaminophen (TYLENOL) 325 MG tablet Take 650 mg by mouth every 6 (six) hours as needed. PRN   ALPRAZolam (XANAX) 0.5 MG tablet One tablet at least 30 minutes before flying, and every four hours as needed   Coenzyme Q10 (CO Q 10 PO) Take by mouth.   cyclobenzaprine (FLEXERIL) 5 MG tablet TAKE 1 TO 2 TABLETS BY MOUTH 3 TIMES DAILY AS NEEDED FOR MUSCLE SPASMS.   fexofenadine-pseudoephedrine (ALLEGRA-D 24) 180-240 MG 24 hr tablet Take 1 tablet by mouth daily.   fluticasone (FLONASE) 50 MCG/ACT nasal spray Place 2 sprays into both nostrils daily.   hyoscyamine (ANASPAZ) 0.125 MG TBDP disintergrating tablet PLACE 1 TABLET (0.125 MG TOTAL) UNDER THE  TONGUE EVERY 4 (FOUR) HOURS AS NEEDED.   Multiple Vitamin (MULTIVITAMIN) capsule Take 1 capsule by mouth daily.   Omega-3 Fatty Acids (FISH OIL) 1000 MG CAPS Take 2 capsules by mouth 2 (two) times daily.   phentermine 15 MG capsule Take 1 capsule (15 mg total) by mouth every morning.   Probiotic Product (PROBIOTIC-10 PO) Take by mouth.   tamsulosin (FLOMAX) 0.4 MG CAPS capsule TAKE 1 CAPSULE (0.4 MG TOTAL) BY MOUTH DAILY. (Patient taking differently: Take 0.4 mg by mouth daily as needed.)   Wheat Dextrin (BENEFIBER PO) Take by mouth daily.   No facility-administered medications prior to visit.    Review of Systems  Constitutional:  Negative for appetite change, chills and fever.  Respiratory:  Negative for chest tightness, shortness of breath and wheezing.   Cardiovascular:  Negative for chest pain and palpitations.  Gastrointestinal:  Positive for blood in stool and diarrhea. Negative for abdominal pain, nausea and vomiting.  Musculoskeletal:  Positive for neck pain.       Objective    BP 129/80 (BP Location: Right Arm, Patient Position: Sitting, Cuff Size: Large)   Pulse 84   Temp 98.8 F (37.1 C) (Oral)   Resp 16   Ht 6\' 3"  (1.905 m)   Wt (!) 314 lb (142.4 kg)   SpO2 99% Comment: room air  BMI 39.25 kg/m    Physical Exam  General appearance: Mildly obese male, cooperative and in no acute distress Head: Normocephalic, without obvious abnormality, atraumatic Respiratory:  Respirations even and unlabored, normal respiratory rate Extremities: All extremities are intact.  Skin: Skin color, texture, turgor normal. No rashes seen  Psych: Appropriate mood and affect. Neurologic: Mental status: Alert, oriented to person, place, and time, thought content appropriate.   Assessment & Plan     1. Class 3 severe obesity without serious comorbidity with body mass index (BMI) of 40.0 to 44.9 in adult, unspecified obesity type ([name]) Doing well with phentermine, but may at a plateu.  Discussed adding topiramate which likely provide more sustained weight loss and may help get below plateua. Will just continue current phetermine dose for now and reassess in about 3 months.   2. Abdominal cramping  3. Diverticulosis/blood per rectum More frequent episodes of cramping which no longer respond to hyoscyamine. Will try change to  dicyclomine (BENTYL) 10 MG capsule; Take 1 capsule (10 mg total) by mouth 3 (three) times daily as needed (stomach spasms).  Dispense: 30 capsule; Refill: 3  Referral back to GI initiated. Was previously evaluated by Dr. Allen Norris in 2021  4. Myofascial neck pain Has gotten more frequent often triggering migraine type headaches. Cyclobenzaprine had been helpful in the past, but no longer effective.   He is going to look into alternative therapies such as accupuncture. Consider PT referral for conventional therapy or dry needling.   - methocarbamol (ROBAXIN) 750 MG tablet; Take 1-2 tablets (750-1,500 mg total) by mouth every 8 (eight) hours as needed for muscle spasms.  Dispense: 60 tablet; Refill: 3  5. Need for immunization against influenza  - Flu Vaccine QUAD 73mo+IM (Fluarix, Fluzone & Alfiuria Quad PF)      The entirety of the information documented in the History of Present Illness, Review of Systems and Physical Exam were personally obtained by me. Portions of this information were initially documented by the CMA and reviewed by me for thoroughness and accuracy.     Lelon Huh, MD  [facility] [phone] (phone) [phone] (fax)  [place]

## 2022-07-15 DIAGNOSIS — Z1159 Encounter for screening for other viral diseases: Secondary | ICD-10-CM | POA: Diagnosis not present

## 2022-07-15 DIAGNOSIS — Z114 Encounter for screening for human immunodeficiency virus [HIV]: Secondary | ICD-10-CM | POA: Diagnosis not present

## 2022-07-15 DIAGNOSIS — Z113 Encounter for screening for infections with a predominantly sexual mode of transmission: Secondary | ICD-10-CM | POA: Diagnosis not present

## 2022-07-15 DIAGNOSIS — Z118 Encounter for screening for other infectious and parasitic diseases: Secondary | ICD-10-CM | POA: Diagnosis not present

## 2022-08-05 ENCOUNTER — Other Ambulatory Visit: Payer: Self-pay | Admitting: Family Medicine

## 2022-08-05 ENCOUNTER — Other Ambulatory Visit: Payer: Self-pay

## 2022-08-05 MED ORDER — PHENTERMINE HCL 15 MG PO CAPS
15.0000 mg | ORAL_CAPSULE | ORAL | 2 refills | Status: DC
  Filled 2022-08-05: qty 30, 30d supply, fill #0
  Filled 2022-09-07: qty 30, 30d supply, fill #1
  Filled 2022-09-28: qty 30, 30d supply, fill #2

## 2022-08-13 ENCOUNTER — Other Ambulatory Visit: Payer: Self-pay

## 2022-08-24 ENCOUNTER — Ambulatory Visit (INDEPENDENT_AMBULATORY_CARE_PROVIDER_SITE_OTHER): Payer: 59 | Admitting: Gastroenterology

## 2022-08-24 ENCOUNTER — Encounter: Payer: Self-pay | Admitting: Gastroenterology

## 2022-08-24 VITALS — BP 118/77 | HR 75 | Temp 98.4°F | Ht 75.0 in | Wt 316.0 lb

## 2022-08-24 DIAGNOSIS — K625 Hemorrhage of anus and rectum: Secondary | ICD-10-CM | POA: Diagnosis not present

## 2022-08-24 NOTE — Progress Notes (Signed)
Primary Care Physician: Malva Limes, MD  Primary Gastroenterologist:  Dr. Midge Minium  Chief Complaint  Patient presents with   Abdominal Cramping    HPI: Jesus Gardner is a 31 y.o. male here with a history of rectal bleeding and had a colonoscopy that showed diverticulosis and what appeared to be ileitis.  The patient's biopsies of the terminal ileum showed:  DIAGNOSIS: A. TERMINAL ILEUM; COLD BIOPSY: - BENIGN ENTERIC MUCOSA WITH NORMAL VILLOUS ARCHITECTURE AND REACTIVE FOLLICULAR LYMPHOID HYPERPLASIA. - FOCAL ULCERATION, ACTIVE ILEITIS, AND GRANULATION TISSUE TYPE CHANGES. - NEGATIVE FOR DYSPLASIA AND MALIGNANCY.   The patient had reported to his PCP that he was having abdominal cramps.  The patient also reported that he was having rectal bleeding a few times a week also.  He was reporting that this were his symptoms of his diverticulosis. The patient reports that his abdominal cramps in the right side of his abdomen that is followed by the rectal bleeding.  He reports that the rectal bleeding is bright red blood per rectum with very seldom clots.  He states that sometimes he'll just sit on the toilet and the bright red blood would come out.  He denies any unexplained weight loss fevers chills nausea vomiting of black stools.   Past Medical History:  Diagnosis Date   Costochondritis 12/30/2015   Elevated transaminase level 03/12/2016   History of chicken pox     Current Outpatient Medications  Medication Sig Dispense Refill   acetaminophen (TYLENOL) 325 MG tablet Take 650 mg by mouth every 6 (six) hours as needed. PRN     ALPRAZolam (XANAX) 0.5 MG tablet One tablet at least 30 minutes before flying, and every four hours as needed 10 tablet 0   dicyclomine (BENTYL) 10 MG capsule Take 1 capsule (10 mg total) by mouth 3 (three) times daily as needed (stomach spasms). 30 capsule 3   fexofenadine-pseudoephedrine (ALLEGRA-D 24) 180-240 MG 24 hr tablet Take 1 tablet by mouth  daily.     fluticasone (FLONASE) 50 MCG/ACT nasal spray Place 2 sprays into both nostrils daily. 16 g 5   hyoscyamine (ANASPAZ) 0.125 MG TBDP disintergrating tablet PLACE 1 TABLET (0.125 MG TOTAL) UNDER THE TONGUE EVERY 4 (FOUR) HOURS AS NEEDED. 30 tablet 1   methocarbamol (ROBAXIN) 750 MG tablet Take 1-2 tablets (750-1,500 mg total) by mouth every 8 (eight) hours as needed for muscle spasms. 60 tablet 3   Multiple Vitamin (MULTIVITAMIN) capsule Take 1 capsule by mouth daily.     Omega-3 Fatty Acids (FISH OIL) 1000 MG CAPS Take 2 capsules by mouth 2 (two) times daily.     phentermine 15 MG capsule Take 1 capsule (15 mg total) by mouth every morning. 30 capsule 2   tamsulosin (FLOMAX) 0.4 MG CAPS capsule TAKE 1 CAPSULE (0.4 MG TOTAL) BY MOUTH DAILY. (Patient taking differently: Take 0.4 mg by mouth daily as needed.) 90 capsule 3   Wheat Dextrin (BENEFIBER PO) Take by mouth daily.     No current facility-administered medications for this visit.    Allergies as of 08/24/2022 - Review Complete 08/24/2022  Allergen Reaction Noted   Pollen extract  12/30/2015    ROS:  General: Negative for anorexia, weight loss, fever, chills, fatigue, weakness. ENT: Negative for hoarseness, difficulty swallowing , nasal congestion. CV: Negative for chest pain, angina, palpitations, dyspnea on exertion, peripheral edema.  Respiratory: Negative for dyspnea at rest, dyspnea on exertion, cough, sputum, wheezing.  GI: See history of present illness.  GU:  Negative for dysuria, hematuria, urinary incontinence, urinary frequency, nocturnal urination.  Endo: Negative for unusual weight change.    Physical Examination:   BP 118/77 (BP Location: Left Arm, Patient Position: Sitting, Cuff Size: Large)   Pulse 75   Temp 98.4 F (36.9 C) (Oral)   Ht 6\' 3"  (1.905 m)   Wt (!) 316 lb (143.3 kg)   BMI 39.50 kg/m   General: Well-nourished, well-developed in no acute distress.  Eyes: No icterus. Conjunctivae  pink. Neuro: Alert and oriented x 3.  Grossly intact. Psych: Alert and cooperative, normal mood and affect.  Labs:    Imaging Studies: No results found.  Assessment and Plan:   Jesus Gardner is a 31 y.o. y/o male who comes in with rectal bleeding and colonoscopy that showed hemorrhoids and diverticulosis.  The diverticulosis was mostly isolated to the sigmoid colon. The patient was tried on dicyclomine after hyoscyamine and states that its decreases the pain but does not make it go away.  The patient has no family history of colon cancer colon polyps.  The patient will be continued on his antispasmodics and has been told that he can take dicyclomine 3 times a day.  He will also be sent to Dr. 32 for hemorrhoidal banding. The patient has been explained the plan and agrees with it.     Tobi Bastos, MD. Midge Minium    Note: This dictation was prepared with Dragon dictation along with smaller phrase technology. Any transcriptional errors that result from this process are unintentional.

## 2022-09-07 ENCOUNTER — Other Ambulatory Visit: Payer: Self-pay

## 2022-09-28 ENCOUNTER — Other Ambulatory Visit: Payer: Self-pay

## 2022-09-29 ENCOUNTER — Other Ambulatory Visit: Payer: Self-pay

## 2022-09-29 ENCOUNTER — Ambulatory Visit (INDEPENDENT_AMBULATORY_CARE_PROVIDER_SITE_OTHER): Payer: 59 | Admitting: Gastroenterology

## 2022-09-29 ENCOUNTER — Encounter: Payer: Self-pay | Admitting: Gastroenterology

## 2022-09-29 VITALS — BP 135/75 | HR 75 | Temp 98.1°F | Ht 75.0 in | Wt 324.0 lb

## 2022-09-29 DIAGNOSIS — K625 Hemorrhage of anus and rectum: Secondary | ICD-10-CM

## 2022-09-29 NOTE — Progress Notes (Signed)
Patient follow-ups today for banding of hemorrhoids    Summary of history :  Seen Dr Servando Snare for rectal bleeding and colonoscopy that showed hemorrhoids and diverticulosis   Failed conservative management and here for hemorroidal bleeding.    Digital rectal exam performed in the presence of a chaperone. External anal findings: Normal Internal findings: Normal, No masses, no blood on glove noticed.    PROCEDURE NOTE: The patient presents with symptomatic grade 1 hemorrhoids, unresponsive to maximal medical therapy, requesting rubber band ligation of his/her hemorrhoidal disease.  All risks, benefits and alternative forms of therapy were described and informed consent was obtained.  In the Left Lateral Decubitus position (if anoscopy is performed) anoscopic examination revealed grade 1 hemorrhoids in the all position(s).   The decision was made to band the LL internal hemorrhoid, and the [facility] was used to perform band ligation without complication.  Digital anorectal examination was then performed to assure proper positioning of the band, and to adjust the banded tissue as required.  The patient was discharged home without pain or other issues.  Dietary and behavioral recommendations were given and (if necessary - prescriptions were given), along with follow-up instructions.  The patient will return 4 weeks for follow-up and possible additional banding as required.  No complications were encountered and the patient tolerated the procedure well.   Plan:  Avoid constipation.    Follow-up:4 weeks   Dr Wyline Mood MD,MRCP [facility]) Gastroenterology/Hepatology Pager: [phone]

## 2022-10-23 ENCOUNTER — Ambulatory Visit (INDEPENDENT_AMBULATORY_CARE_PROVIDER_SITE_OTHER): Payer: BC Managed Care – PPO | Admitting: Family Medicine

## 2022-10-23 ENCOUNTER — Encounter: Payer: Self-pay | Admitting: Family Medicine

## 2022-10-23 ENCOUNTER — Other Ambulatory Visit: Payer: Self-pay

## 2022-10-23 VITALS — BP 135/81 | HR 69 | Ht 75.0 in | Wt 324.9 lb

## 2022-10-23 DIAGNOSIS — Z6841 Body Mass Index (BMI) 40.0 and over, adult: Secondary | ICD-10-CM | POA: Diagnosis not present

## 2022-10-23 MED ORDER — ZEPBOUND 2.5 MG/0.5ML ~~LOC~~ SOAJ
2.5000 mg | SUBCUTANEOUS | 0 refills | Status: DC
  Filled 2022-10-23 (×2): qty 2, 28d supply, fill #0

## 2022-10-23 MED ORDER — OMEPRAZOLE 20 MG PO CPDR
20.0000 mg | DELAYED_RELEASE_CAPSULE | Freq: Every day | ORAL | 3 refills | Status: DC | PRN
  Filled 2022-10-23: qty 30, 30d supply, fill #0
  Filled 2023-02-08: qty 30, 30d supply, fill #1
  Filled 2023-03-29: qty 30, 30d supply, fill #2
  Filled 2023-06-02: qty 30, 30d supply, fill #3

## 2022-10-23 NOTE — Progress Notes (Signed)
I,Sha'taria Tyson,acting as a Education administrator for Lelon Huh, MD.,have documented all relevant documentation on the behalf of Lelon Huh, MD,as directed by  Lelon Huh, MD while in the presence of Lelon Huh, MD.   Established patient visit   Patient: Jesus Gardner   DOB: Oct 22, 1990   32 y.o. Male  MRN: [id number] Visit Date: 10/23/2022  Today's healthcare provider: Lelon Huh, MD   No chief complaint on file.  Subjective    HPI  Follow up for Class 3 severe obesity without serious comorbidity with body mass index (BMI) of 40.0 to 44.9 in adult, unspecified obesity type [facility])   The patient was last seen for this 3 months ago. Changes made at last visit include continuing current phetermine dose for now and reassess in about 3 months.  He reports excellent compliance with treatment. He feels that condition is Unchanged. He is not having side effects.  He states he will typically lose about 10 pounds when he takes phentermine, then plateaus, and will gain all of weight back within about a week when he stops. Has no side effects that he know of.  -----------------------------------------------------------------------------------------   Medications: Outpatient Medications Prior to Visit  Medication Sig   acetaminophen (TYLENOL) 325 MG tablet Take 650 mg by mouth every 6 (six) hours as needed. PRN   ALPRAZolam (XANAX) 0.5 MG tablet One tablet at least 30 minutes before flying, and every four hours as needed   dicyclomine (BENTYL) 10 MG capsule Take 1 capsule (10 mg total) by mouth 3 (three) times daily as needed (stomach spasms).   fexofenadine-pseudoephedrine (ALLEGRA-D 24) 180-240 MG 24 hr tablet Take 1 tablet by mouth daily.   fluticasone (FLONASE) 50 MCG/ACT nasal spray Place 2 sprays into both nostrils daily.   hyoscyamine (ANASPAZ) 0.125 MG TBDP disintergrating tablet PLACE 1 TABLET (0.125 MG TOTAL) UNDER THE TONGUE EVERY 4 (FOUR) HOURS AS NEEDED.   methocarbamol  (ROBAXIN) 750 MG tablet Take 1-2 tablets (750-1,500 mg total) by mouth every 8 (eight) hours as needed for muscle spasms.   Multiple Vitamin (MULTIVITAMIN) capsule Take 1 capsule by mouth daily.   Omega-3 Fatty Acids (FISH OIL) 1000 MG CAPS Take 2 capsules by mouth 2 (two) times daily.   phentermine 15 MG capsule Take 1 capsule (15 mg total) by mouth every morning.   SPIKEVAX syringe Inject 0.5 mLs into the muscle once.   SUMAtriptan (IMITREX) 20 MG/ACT nasal spray Place 20 mg into the nose once.   [EXPIRED] tamsulosin (FLOMAX) 0.4 MG CAPS capsule TAKE 1 CAPSULE (0.4 MG TOTAL) BY MOUTH DAILY. (Patient taking differently: Take 0.4 mg by mouth daily as needed.)   Wheat Dextrin (BENEFIBER PO) Take by mouth daily.   No facility-administered medications prior to visit.    Review of Systems  Constitutional:  Negative for appetite change, chills and fever.  Respiratory:  Negative for chest tightness, shortness of breath and wheezing.   Cardiovascular:  Negative for chest pain and palpitations.  Gastrointestinal:  Negative for abdominal pain, nausea and vomiting.      Objective    BP 135/81 (BP Location: Left Arm, Patient Position: Sitting, Cuff Size: Large)   Pulse 69   Ht 6\' 3"  (1.905 m)   Wt (!) 324 lb 14.4 oz (147.4 kg)   SpO2 98%   BMI 40.61 kg/m   Physical Exam   General appearance: Obese male, cooperative and in no acute distress Head: Normocephalic, without obvious abnormality, atraumatic Respiratory: Respirations even and unlabored, normal respiratory  rate Extremities: All extremities are intact.  Skin: Skin color, texture, turgor normal. No rashes seen  Psych: Appropriate mood and affect. Neurologic: Mental status: Alert, oriented to person, place, and time, thought content appropriate.   Assessment & Plan     1. Class 3 severe obesity without serious comorbidity with body mass index (BMI) of 40.0 to 44.9 in adult, unspecified obesity type ([place]) Is only seeing about 3%  weight loss with phentermine and rapidly regaining weight when he stops. Discussed option of adding topiramate which may provide more sustained weight loss, versus trial of injectable medication which are generally more effective. Discussed potential adverse effects in detail and he has no know family history of cancers associated with MEN. Will try  tirzepatide (ZEPBOUND) 2.5 MG/0.5ML Pen; Inject 2.5 mg into the skin once a week.  Dispense: 2 mL; Refill: 0, will plan on increase to 5mg  in 4 weeks unless he has trouble tolerating medications. Will follow up in office in about 3 months.   For GERD will refill omeprazole (PRILOSEC) 20 MG capsule; Take 1 capsule (20 mg total) by mouth daily as needed.  Dispense: 30 capsule; Refill: 3      The entirety of the information documented in the History of Present Illness, Review of Systems and Physical Exam were personally obtained by me. Portions of this information were initially documented by the CMA and reviewed by me for thoroughness and accuracy.     Lelon Huh, MD  [facility] [phone] (phone) [phone] (fax)  [place]

## 2022-10-26 ENCOUNTER — Other Ambulatory Visit: Payer: Self-pay | Admitting: Family Medicine

## 2022-10-26 ENCOUNTER — Other Ambulatory Visit: Payer: Self-pay

## 2022-10-26 MED ORDER — WEGOVY 0.25 MG/0.5ML ~~LOC~~ SOAJ
0.2500 mg | SUBCUTANEOUS | 0 refills | Status: DC
  Filled 2022-10-26 – 2022-10-30 (×3): qty 2, 28d supply, fill #0

## 2022-10-30 ENCOUNTER — Other Ambulatory Visit: Payer: Self-pay

## 2022-11-05 ENCOUNTER — Encounter: Payer: Self-pay | Admitting: Gastroenterology

## 2022-11-05 ENCOUNTER — Ambulatory Visit (INDEPENDENT_AMBULATORY_CARE_PROVIDER_SITE_OTHER): Payer: BC Managed Care – PPO | Admitting: Gastroenterology

## 2022-11-05 VITALS — BP 133/79 | HR 69 | Temp 98.7°F | Ht 75.0 in | Wt 327.0 lb

## 2022-11-05 DIAGNOSIS — K625 Hemorrhage of anus and rectum: Secondary | ICD-10-CM

## 2022-11-05 NOTE — Progress Notes (Signed)
Patient follow-ups today for banding of hemorrhoids    Summary of history :  Seen Dr Allen Norris for rectal bleeding and colonoscopy that showed hemorrhoids and diverticulosis    Failed conservative management and here for hemorroidal bleeding.       First round: 09/29/2022: Left lateral column banded   Interval history   09/29/2022-11/05/2022  Did well after last banding no bleeding   Digital rectal exam performed in the presence of a chaperone. External anal findings: normal  Internal findings: , No masses, no blood on glove noticed.    PROCEDURE NOTE: The patient presents with symptomatic grade 1 hemorrhoids, unresponsive to maximal medical therapy, requesting rubber band ligation of his/her hemorrhoidal disease.  All risks, benefits and alternative forms of therapy were described and informed consent was obtained.  In the Left Lateral Decubitus position (if anoscopy is performed) anoscopic examination revealed grade 1 hemorrhoids in the RA and RP position(s).   The decision was made to band the RA internal hemorrhoid, and the [place] was used to perform band ligation without complication.  Digital anorectal examination was then performed to assure proper positioning of the band, and to adjust the banded tissue as required.  The patient was discharged home without pain or other issues.  Dietary and behavioral recommendations were given and (if necessary - prescriptions were given), along with follow-up instructions.  The patient will return 4 weeks for follow-up and possible additional banding as required.  No complications were encountered and the patient tolerated the procedure well.   Plan:  Avoid constipation.  Commence on stool softeners if not already on  Follow-up: 4 weeks  Dr Jonathon Bellows MD,MRCP [facility]) Gastroenterology/Hepatology Pager: [phone]

## 2022-11-11 ENCOUNTER — Telehealth: Payer: BC Managed Care – PPO | Admitting: Gastroenterology

## 2022-11-14 ENCOUNTER — Other Ambulatory Visit: Payer: Self-pay | Admitting: Family Medicine

## 2022-11-14 DIAGNOSIS — K5790 Diverticulosis of intestine, part unspecified, without perforation or abscess without bleeding: Secondary | ICD-10-CM

## 2022-11-15 ENCOUNTER — Other Ambulatory Visit: Payer: Self-pay

## 2022-11-15 ENCOUNTER — Other Ambulatory Visit: Payer: Self-pay | Admitting: Family Medicine

## 2022-11-15 DIAGNOSIS — K5790 Diverticulosis of intestine, part unspecified, without perforation or abscess without bleeding: Secondary | ICD-10-CM

## 2022-11-16 ENCOUNTER — Other Ambulatory Visit: Payer: Self-pay

## 2022-11-17 ENCOUNTER — Other Ambulatory Visit: Payer: Self-pay

## 2022-11-17 MED FILL — Dicyclomine HCl Cap 10 MG: ORAL | 10 days supply | Qty: 30 | Fill #0 | Status: AC

## 2022-11-17 NOTE — Telephone Encounter (Signed)
Requested Prescriptions  Pending Prescriptions Disp Refills   dicyclomine (BENTYL) 10 MG capsule 30 capsule 3    Sig: Take 1 capsule (10 mg total) by mouth 3 (three) times daily as needed (stomach spasms).     Gastroenterology:  Antispasmodic Agents Passed - 11/15/2022  6:10 PM      Passed - Valid encounter within last 12 months    Recent Outpatient Visits           3 weeks ago Class 3 severe obesity without serious comorbidity with body mass index (BMI) of 40.0 to 44.9 in adult, unspecified obesity type [facility])   [place] Birdie Sons, MD   4 months ago Class 3 severe obesity without serious comorbidity with body mass index (BMI) of 40.0 to 44.9 in adult, unspecified obesity type [facility])   [place], Donald E, MD   6 months ago Annual physical exam   [facility] Birdie Sons, MD   1 year ago Annual physical exam   [facility] Birdie Sons, MD   2 years ago Annual physical exam   [facility] Birdie Sons, MD       Future Appointments             In 2 weeks Jonathon Bellows, MD [place] [facility] [place]   In 3 weeks Ronnell Freshwater, NP [facility]   In 2 months Caryn Section, Kirstie Peri, [place], [place]

## 2022-11-18 ENCOUNTER — Other Ambulatory Visit: Payer: Self-pay

## 2022-11-19 ENCOUNTER — Other Ambulatory Visit: Payer: Self-pay

## 2022-11-20 ENCOUNTER — Other Ambulatory Visit: Payer: Self-pay

## 2022-11-23 ENCOUNTER — Other Ambulatory Visit: Payer: Self-pay

## 2022-11-23 ENCOUNTER — Other Ambulatory Visit: Payer: Self-pay | Admitting: Family Medicine

## 2022-11-23 DIAGNOSIS — M542 Cervicalgia: Secondary | ICD-10-CM

## 2022-11-24 ENCOUNTER — Other Ambulatory Visit: Payer: Self-pay | Admitting: Family Medicine

## 2022-11-24 ENCOUNTER — Other Ambulatory Visit: Payer: Self-pay

## 2022-11-24 DIAGNOSIS — M542 Cervicalgia: Secondary | ICD-10-CM

## 2022-11-24 NOTE — Telephone Encounter (Signed)
Requested medication (s) are due for refill today: yes  Requested medication (s) are on the active medication list: yes  Last refill:  10/26/22  Future visit scheduled: yes  Notes to clinic:  need rx for 0.21m please       Requested Prescriptions  Pending Prescriptions Disp Refills   Semaglutide-Weight Management (WEGOVY) 0.25 MG/0.5ML SOAJ 2 mL 0    Sig: Inject 0.25 mg into the skin once a week.     Endocrinology:  Diabetes - GLP-1 Receptor Agonists - semaglutide Failed - 11/23/2022  5:48 PM      Failed - HBA1C in normal range and within 180 days    No results found for: "HGBA1C", "LABA1C"       Passed - Cr in normal range and within 360 days    Creatinine, Ser  Date Value Ref Range Status  04/28/2022 0.83 0.76 - 1.27 mg/dL Final         Passed - Valid encounter within last 6 months    Recent Outpatient Visits           1 month ago Class 3 severe obesity without serious comorbidity with body mass index (BMI) of 40.0 to 44.9 in adult, unspecified obesity type ([facility]   [place] E, [place]   4 months ago Class 3 severe obesity without serious comorbidity with body mass index (BMI) of 40.0 to 44.9 in adult, unspecified obesity type ([facility]   [place] Donald E, MD   7 months ago Annual physical exam   [facility]   1 year ago Annual physical exam   [facility]   2 years ago Annual physical exam   [facility]       Future Appointments             In 1 week AJonathon Bellows MD [facility]  In 2 weeks BRonnell Freshwater NP [facility]  In 2 months FCaryn Section DKirstie Peri MD [facility]

## 2022-11-25 ENCOUNTER — Other Ambulatory Visit: Payer: Self-pay

## 2022-11-25 NOTE — Telephone Encounter (Signed)
Requested medication (s) are due for refill today: Robaxin yes, Wegovy For review  Requested medication (s) are on the active medication list: yes  Last refill: Robaxin 07/13/22  #60  3 refills  Wegovy 10/26/22 40m 0 refills  Future visit scheduled yes 01/25/23  Notes to clinic:Robaxin not delegated, [id number] due to labs. Please review, thank you.  Requested Prescriptions  Pending Prescriptions Disp Refills   methocarbamol (ROBAXIN) 750 MG tablet 60 tablet 3    Sig: Take 1-2 tablets (750-1,500 mg total) by mouth every 8 (eight) hours as needed for muscle spasms.     Not Delegated - Analgesics:  Muscle Relaxants Failed - 11/24/2022  5:56 PM      Failed - This refill cannot be delegated      Passed - Valid encounter within last 6 months    Recent Outpatient Visits           1 month ago Class 3 severe obesity without serious comorbidity with body mass index (BMI) of 40.0 to 44.9 in adult, unspecified obesity type ([facility]   CPros[place]ityFBirdie Sons MD   4 months ago Class 3 severe obesity without serious comorbidity with body mass index (BMI) of 40.0 to 44.9 in adult, unspecified obesity type ([facility]   [facility] Donald E, MD   7 months ago Annual physical exam   [facility]   1 year ago Annual physical exam   [place] Donald E, MD   2 years ago Annual physical exam   [place] Donald E, MD       Future Appointments             In 1 week AJonathon Bellows MD CLemon [facility]oveGastroenterology at [place]  In 2 weeks BRonnell Freshwater NP [facility]  In 2 months FCaryn Section DKirstie Peri MD [facility]             WEGOVY 0.25 MG/0.5ML SMarcie MowersMed Name: Semaglutide-Weight Management ([facility] 0.25 MG/0.5ML Solution Auto-injector]  2 mL 0    Sig: Inject 0.25 mg into the skin once a week.     Endocrinology:  Diabetes - GLP-1 Receptor Agonists - semaglutide Failed - 11/24/2022  5:56 PM      Failed - HBA1C in normal range and within 180 days    No results found for: "HGBA1C", "LABA1C"       Passed - Cr in normal range and within 360 days    Creatinine, Ser  Date Value Ref Range Status  04/28/2022 0.83 0.76 - 1.27 mg/dL Final         Passed - Valid encounter within last 6 months    Recent Outpatient Visits           1 month ago Class 3 severe obesity without serious comorbidity with body mass index (BMI) of 40.0 to 44.9 in adult, unspecified obesity type ([facility]   CB[place]FBirdie Sons MD   4 months ago Class 3 severe obesity without serious comorbidity with body mass index (BMI) of 40.0 to 44.9 in adult, unspecified obesity type ([facility]   [place] [facility]   7 months ago Annual physical exam   [facility] Sons MD  1 year ago Annual physical exam   [facility] Birdie Sons, MD   2 years ago Annual physical exam   [facility] Birdie Sons, MD       Future Appointments             In 1 week Jonathon Bellows, MD [place] [facility] [place]   In 2 weeks Ronnell Freshwater, NP [facility] at [facility]   In 2 months Caryn Section, Kirstie Peri, MD [facility]

## 2022-11-25 NOTE — Telephone Encounter (Incomplete)
{  ES VIEW SCHEDULE WEB***:}

## 2022-11-26 ENCOUNTER — Other Ambulatory Visit: Payer: Self-pay

## 2022-11-26 MED FILL — Semaglutide (Weight Mngmt) Soln Auto-Injector 0.25 MG/0.5ML: SUBCUTANEOUS | Qty: 2 | Fill #0 | Status: CN

## 2022-11-26 MED FILL — Semaglutide (Weight Mngmt) Soln Auto-Injector 0.5 MG/0.5ML: SUBCUTANEOUS | 28 days supply | Qty: 2 | Fill #0 | Status: AC

## 2022-11-26 MED FILL — Methocarbamol Tab 750 MG: ORAL | 10 days supply | Qty: 60 | Fill #0 | Status: AC

## 2022-11-30 ENCOUNTER — Other Ambulatory Visit: Payer: Self-pay

## 2022-12-07 ENCOUNTER — Encounter: Payer: Self-pay | Admitting: Gastroenterology

## 2022-12-07 ENCOUNTER — Ambulatory Visit (INDEPENDENT_AMBULATORY_CARE_PROVIDER_SITE_OTHER): Payer: BC Managed Care – PPO | Admitting: Gastroenterology

## 2022-12-07 VITALS — BP 130/78 | HR 68 | Temp 98.3°F | Wt 326.8 lb

## 2022-12-07 DIAGNOSIS — K625 Hemorrhage of anus and rectum: Secondary | ICD-10-CM | POA: Diagnosis not present

## 2022-12-07 DIAGNOSIS — K648 Other hemorrhoids: Secondary | ICD-10-CM | POA: Diagnosis not present

## 2022-12-07 NOTE — Progress Notes (Signed)
Patient follow-ups today for banding of hemorrhoids    Summary of history :  Seen Dr Allen Norris for rectal bleeding and colonoscopy that showed hemorrhoids and diverticulosis    Failed conservative management and here for hemorroidal bleeding.     First round: 09/29/2022: LL column banded  Second round:11/05/2022: RA column banded    Interval history  11/05/2022- 12/07/2022  Doing well since last banding no problems  Digital rectal exam performed in the presence of a chaperone. External anal findings: Normal Internal findings: Normal, No masses, no blood on glove noticed. Vitals:   12/07/22 1315  BP: 130/78  Pulse: 68  Temp: 98.3 F (36.8 C)      PROCEDURE NOTE: The patient presents with symptomatic grade 1 hemorrhoids, unresponsive to maximal medical therapy, requesting rubber band ligation of his/her hemorrhoidal disease.  All risks, benefits and alternative forms of therapy were described and informed consent was obtained.  In the Left Lateral Decubitus position (if anoscopy is performed) anoscopic examination revealed grade 1 hemorrhoids in the RP position(s).   The decision was made to band the RP internal hemorrhoid, and the [place] was used to perform band ligation without complication.  Digital anorectal examination was then performed to assure proper positioning of the band, and to adjust the banded tissue as required.  The patient was discharged home without pain or other issues.  Dietary and behavioral recommendations were given and (if necessary - prescriptions were given), along with follow-up instructions.  The patient will return     as needed for follow-up and possible additional banding as required.  No complications were encountered and the patient tolerated the procedure well.   Plan:  Avoid constipation.  Commence on stool softeners if not already on  Follow-up: As needed  Dr Jonathon Bellows MD,MRCP [facility]) Gastroenterology/Hepatology Pager:  [phone]

## 2022-12-08 ENCOUNTER — Telehealth: Payer: Self-pay

## 2022-12-08 DIAGNOSIS — R197 Diarrhea, unspecified: Secondary | ICD-10-CM

## 2022-12-08 NOTE — Telephone Encounter (Signed)
-----   Message from [place], [place] sent at 12/07/2022  5:09 PM EST ----- Regarding: Advise Patient came in to see Dr. Vicente Males for his third hemorrhoid banding and he wanted Dr. Allen Norris to know that he is still having issues with his bowel movements. Patient stated that he does not have constipation but his bowels are still loose. Therefore, he wanted to know if there is anything for him to take to help him have formed stools. Please advise. Patient is taking Benefiber but he is also taking Wegovy.

## 2022-12-08 NOTE — Telephone Encounter (Signed)
This message was sent to Dr. Allen Norris per patient when he was here at his hemorrhoid banding with Dr. Vicente Males. Lucilla Lame, MD  Wayna Chalet, [place] He can try Imodium.       Previous Messages    ----- Message ----- From: Wayna Chalet, [place] Sent: 12/07/2022   5:15 PM EST To: Lurlean Nanny, CMA; Lucilla Lame, MD Subject: Advise                                        Patient came in to see Dr. Vicente Males for his third hemorrhoid banding and he wanted Dr. Allen Norris to know that he is still having issues with his bowel movements. Patient stated that he does not have constipation but his bowels are still loose. Therefore, he wanted to know if there is anything for him to take to help him have formed stools. Please advise. Patient is taking Benefiber but he is also taking Wegovy.

## 2022-12-08 NOTE — Telephone Encounter (Signed)
Error

## 2022-12-09 NOTE — Telephone Encounter (Signed)
1. Get stool testing for C diff and GI pcr  2. If negative will start on Imodium  3. Follow up with Dr Allen Norris once he returns

## 2022-12-10 NOTE — Addendum Note (Signed)
Addended by: Lurlean Nanny on: 12/10/2022 02:37 PM   Modules accepted: Orders

## 2022-12-10 NOTE — Telephone Encounter (Signed)
Stool tests ordered and pt is aware as instructed

## 2022-12-11 ENCOUNTER — Ambulatory Visit (INDEPENDENT_AMBULATORY_CARE_PROVIDER_SITE_OTHER): Payer: BC Managed Care – PPO | Admitting: Nurse Practitioner

## 2022-12-11 ENCOUNTER — Other Ambulatory Visit: Payer: Self-pay

## 2022-12-11 ENCOUNTER — Encounter: Payer: Self-pay | Admitting: Nurse Practitioner

## 2022-12-11 VITALS — BP 134/81 | HR 76 | Ht 75.0 in | Wt 326.4 lb

## 2022-12-11 DIAGNOSIS — G43119 Migraine with aura, intractable, without status migrainosus: Secondary | ICD-10-CM

## 2022-12-11 DIAGNOSIS — Z7689 Persons encountering health services in other specified circumstances: Secondary | ICD-10-CM

## 2022-12-11 DIAGNOSIS — Z6841 Body Mass Index (BMI) 40.0 and over, adult: Secondary | ICD-10-CM

## 2022-12-11 MED ORDER — ELETRIPTAN HYDROBROMIDE 40 MG PO TABS
40.0000 mg | ORAL_TABLET | ORAL | 2 refills | Status: AC | PRN
  Filled 2022-12-11: qty 10, 30d supply, fill #0
  Filled 2023-02-08: qty 10, 30d supply, fill #1

## 2022-12-11 NOTE — Progress Notes (Signed)
New Patient Office Visit  Subjective    Patient ID: Jesus ShieldsMichael D Okimoto, male    DOB: 10/14/1990  Age: 32 y.o. MRN: [id number]  CC:  Chief Complaint  Patient presents with   New Patient (Initial Visit)    HPI Jesus ShieldsMichael D Kulpa presents to establish care -changing from other local provider  -just started wegovy. Just started 0.25 mg weekly. Will consider today initial weight loss visit  -starting weight 12/11/2022 - 326 -he does consume a low calorie diet.  -exercises frequently. Lifts weights.  -headaches  ==photophobia --needs to have dark room --massage therapy has helped. Not helping as much as it used to --did have rx for imitrex nasal spray. Worked a little.  --ha snot tried anything but that for migraine headaches   -He denies chest pain, chest pressure, or shortness of breath. He denies headaches or visual disturbances. He denies abdominal pain, nausea, vomiting, or changes in bowel or bladder habits.    Outpatient Encounter Medications as of 12/11/2022  Medication Sig   acetaminophen (TYLENOL) 325 MG tablet Take 650 mg by mouth every 6 (six) hours as needed. PRN   ALPRAZolam (XANAX) 0.5 MG tablet One tablet at least 30 minutes before flying, and every four hours as needed   dicyclomine (BENTYL) 10 MG capsule Take 1 capsule (10 mg total) by mouth 3 (three) times daily as needed (stomach spasms).   eletriptan (RELPAX) 40 MG tablet Take 1 tablet (40 mg total) by mouth as needed for migraine or headache. May repeat in 2 hours if headache persists or recurs.   fexofenadine-pseudoephedrine (ALLEGRA-D 24) 180-240 MG 24 hr tablet Take 1 tablet by mouth daily.   fluticasone (FLONASE) 50 MCG/ACT nasal spray Place 2 sprays into both nostrils daily.   methocarbamol (ROBAXIN) 750 MG tablet Take 1-2 tablets (750-1,500 mg total) by mouth every 8 (eight) hours as needed for muscle spasms.   Multiple Vitamin (MULTIVITAMIN) capsule Take 1 capsule by mouth daily.   Omega-3 Fatty Acids (FISH OIL)  1000 MG CAPS Take 2 capsules by mouth 2 (two) times daily.   omeprazole (PRILOSEC) 20 MG capsule Take 1 capsule (20 mg total) by mouth daily as needed.   SPIKEVAX syringe Inject 0.5 mLs into the muscle once.   SUMAtriptan (IMITREX) 20 MG/ACT nasal spray Place 20 mg into the nose once.   Wheat Dextrin (BENEFIBER PO) Take by mouth daily.   [DISCONTINUED] Semaglutide-Weight Management (WEGOVY) 0.5 MG/0.5ML SOAJ Inject 0.5 mg into the skin once a week.   No facility-administered encounter medications on file as of 12/11/2022.    Past Medical History:  Diagnosis Date   Costochondritis 12/30/2015   Elevated transaminase level 03/12/2016   History of chicken pox     Past Surgical History:  Procedure Laterality Date   COLONOSCOPY WITH PROPOFOL N/A 08/20/2020   Procedure: COLONOSCOPY WITH PROPOFOL;  Surgeon: Midge MiniumWohl, Darren, MD;  [facility];  Service: Endoscopy;  Laterality: N/A;   None      Family History  Problem Relation Age of Onset   Non-Hodgkin's lymphoma Mother [id number]   Cerebrovascular Disease Father    Diabetes Father        type 2   CAD Father    Prostate cancer Maternal Uncle     Social History   Socioeconomic History   Marital status: Married    Spouse name: Not on file   Number of children: Not on file   Years of education: Not on file   Highest education level:  Not on file  Occupational History   Occupation: [employer]  Tobacco Use   Smoking status: Never   Smokeless tobacco: Never  Vaping Use   Vaping Use: Never used  Substance and Sexual Activity   Alcohol use: Yes    Alcohol/week: 0.0 standard drinks of alcohol    Comment: occasionally   Drug use: No   Sexual activity: Not on file  Other Topics Concern   Not on file  Social History Narrative   Not on file   Social Determinants of Health   Financial Resource Strain: Not on file  Food Insecurity: Not on file  Transportation Needs: Not on file  Physical Activity: Not on file  Stress: Not on file   Social Connections: Not on file  Intimate Partner Violence: Not on file    ROS See HPI      Objective    Today's Vitals   12/11/22 0916  BP: 134/81  Pulse: 76  SpO2: 96%  Weight: (Abnormal) 326 lb 6.4 oz (148.1 kg)  Height: 6\' 3"  (1.905 m)   Body mass index is 40.8 kg/m.   Physical Exam Vitals and nursing note reviewed.  Constitutional:      Appearance: Normal appearance. He is well-developed. He is obese.  HENT:     Head: Normocephalic and atraumatic.     Nose: Nose normal.     Mouth/Throat:     Mouth: Mucous membranes are moist.     Pharynx: Oropharynx is clear.  Eyes:     Extraocular Movements: Extraocular movements intact.     Conjunctiva/sclera: Conjunctivae normal.     Pupils: Pupils are equal, round, and reactive to light.  Neck:     Vascular: No carotid bruit.  Cardiovascular:     Rate and Rhythm: Normal rate and regular rhythm.     Pulses: Normal pulses.     Heart sounds: Normal heart sounds.  Pulmonary:     Effort: Pulmonary effort is normal.     Breath sounds: Normal breath sounds.  Abdominal:     Palpations: Abdomen is soft.  Musculoskeletal:        General: Normal range of motion.     Cervical back: Normal range of motion and neck supple.  Lymphadenopathy:     Cervical: No cervical adenopathy.  Skin:    General: Skin is warm and dry.     Capillary Refill: Capillary refill takes less than 2 seconds.  Neurological:     General: No focal deficit present.     Mental Status: He is alert and oriented to person, place, and time.  Psychiatric:        Mood and Affect: Mood normal.        Behavior: Behavior normal.        Thought Content: Thought content normal.        Judgment: Judgment normal.         Assessment & Plan:   Problem List Items Addressed This Visit       Cardiovascular and Mediastinum   Intractable migraine with aura without status migrainosus - Primary    Trial relpax 20 mg. Take as needed for migraine headaches.        Relevant Medications   eletriptan (RELPAX) 40 MG tablet     Other   BMI 40.0-44.9, adult    Increase Wegovy to 0.5 mg weekly. Continue with low calorie, high-protein diet. Incorporate exercise into daily activities.        Other Visit Diagnoses  Encounter to establish care           Return in about 6 weeks (around 01/22/2023) for routine - weight management. already on Wegovy .   Carlean Jews, NP

## 2022-12-16 ENCOUNTER — Other Ambulatory Visit: Payer: Self-pay

## 2022-12-16 DIAGNOSIS — R197 Diarrhea, unspecified: Secondary | ICD-10-CM

## 2022-12-21 ENCOUNTER — Other Ambulatory Visit: Payer: Self-pay

## 2022-12-21 MED FILL — Semaglutide (Weight Mngmt) Soln Auto-Injector 0.5 MG/0.5ML: SUBCUTANEOUS | 28 days supply | Qty: 2 | Fill #1 | Status: CN

## 2022-12-25 ENCOUNTER — Other Ambulatory Visit: Payer: Self-pay

## 2022-12-25 MED FILL — Semaglutide (Weight Mngmt) Soln Auto-Injector 0.5 MG/0.5ML: SUBCUTANEOUS | 28 days supply | Qty: 2 | Fill #1 | Status: CN

## 2022-12-28 ENCOUNTER — Other Ambulatory Visit: Payer: Self-pay

## 2022-12-29 ENCOUNTER — Encounter: Payer: Self-pay | Admitting: Gastroenterology

## 2022-12-29 NOTE — Progress Notes (Signed)
No specimen receioved per lab

## 2022-12-30 LAB — GI PROFILE, STOOL, PCR

## 2022-12-31 ENCOUNTER — Other Ambulatory Visit: Payer: Self-pay

## 2022-12-31 DIAGNOSIS — K625 Hemorrhage of anus and rectum: Secondary | ICD-10-CM

## 2022-12-31 DIAGNOSIS — R197 Diarrhea, unspecified: Secondary | ICD-10-CM

## 2023-01-01 LAB — C DIFFICILE TOXINS A+B W/RFLX

## 2023-01-01 LAB — SPECIMEN STATUS REPORT

## 2023-01-12 ENCOUNTER — Encounter: Payer: Self-pay | Admitting: Nurse Practitioner

## 2023-01-12 MED FILL — Dicyclomine HCl Cap 10 MG: ORAL | 10 days supply | Qty: 30 | Fill #1 | Status: AC

## 2023-01-12 MED FILL — Semaglutide (Weight Mngmt) Soln Auto-Injector 0.5 MG/0.5ML: SUBCUTANEOUS | 28 days supply | Qty: 2 | Fill #1 | Status: CN

## 2023-01-13 ENCOUNTER — Other Ambulatory Visit: Payer: Self-pay

## 2023-01-14 ENCOUNTER — Other Ambulatory Visit: Payer: Self-pay | Admitting: Nurse Practitioner

## 2023-01-14 MED ORDER — WEGOVY 1 MG/0.5ML ~~LOC~~ SOAJ
1.0000 mg | SUBCUTANEOUS | 1 refills | Status: DC
  Filled 2023-01-14: qty 2, 28d supply, fill #0
  Filled 2023-02-08 – 2023-02-19 (×6): qty 2, 28d supply, fill #1

## 2023-01-15 ENCOUNTER — Other Ambulatory Visit: Payer: Self-pay

## 2023-01-15 MED FILL — Methocarbamol Tab 750 MG: ORAL | 10 days supply | Qty: 60 | Fill #1 | Status: AC

## 2023-01-17 DIAGNOSIS — Z6841 Body Mass Index (BMI) 40.0 and over, adult: Secondary | ICD-10-CM | POA: Insufficient documentation

## 2023-01-17 DIAGNOSIS — G43119 Migraine with aura, intractable, without status migrainosus: Secondary | ICD-10-CM | POA: Insufficient documentation

## 2023-01-17 NOTE — Assessment & Plan Note (Signed)
Trial relpax 20 mg. Take as needed for migraine headaches.

## 2023-01-17 NOTE — Assessment & Plan Note (Signed)
Increase Wegovy to 0.5 mg weekly. Continue with low calorie, high-protein diet. Incorporate exercise into daily activities.

## 2023-01-25 ENCOUNTER — Ambulatory Visit: Payer: BC Managed Care – PPO | Admitting: Family Medicine

## 2023-01-29 LAB — SPECIMEN STATUS REPORT

## 2023-01-29 LAB — CALPROTECTIN, FECAL

## 2023-01-29 LAB — GI PROFILE, STOOL, PCR

## 2023-02-08 ENCOUNTER — Other Ambulatory Visit: Payer: Self-pay

## 2023-02-08 MED FILL — Dicyclomine HCl Cap 10 MG: ORAL | 10 days supply | Qty: 30 | Fill #2 | Status: AC

## 2023-02-10 ENCOUNTER — Other Ambulatory Visit: Payer: Self-pay

## 2023-02-12 ENCOUNTER — Other Ambulatory Visit: Payer: Self-pay

## 2023-02-17 ENCOUNTER — Other Ambulatory Visit: Payer: Self-pay

## 2023-02-18 ENCOUNTER — Other Ambulatory Visit: Payer: Self-pay

## 2023-02-19 ENCOUNTER — Other Ambulatory Visit: Payer: Self-pay

## 2023-02-19 MED FILL — Methocarbamol Tab 750 MG: ORAL | 10 days supply | Qty: 60 | Fill #2 | Status: AC

## 2023-02-22 ENCOUNTER — Telehealth: Payer: Self-pay

## 2023-02-22 ENCOUNTER — Other Ambulatory Visit: Payer: Self-pay

## 2023-02-22 NOTE — Telephone Encounter (Signed)
Paperwork has been handed to provider on 02/22/2023

## 2023-02-23 ENCOUNTER — Other Ambulatory Visit: Payer: Self-pay

## 2023-02-24 ENCOUNTER — Other Ambulatory Visit: Payer: Self-pay

## 2023-02-24 ENCOUNTER — Other Ambulatory Visit: Payer: Self-pay | Admitting: Nurse Practitioner

## 2023-02-24 MED ORDER — SEMAGLUTIDE-WEIGHT MANAGEMENT 1.7 MG/0.75ML ~~LOC~~ SOAJ
1.7000 mg | SUBCUTANEOUS | 0 refills | Status: DC
  Filled 2023-02-24: qty 3, 28d supply, fill #0

## 2023-03-04 ENCOUNTER — Other Ambulatory Visit: Payer: Self-pay

## 2023-03-04 ENCOUNTER — Ambulatory Visit (INDEPENDENT_AMBULATORY_CARE_PROVIDER_SITE_OTHER): Payer: BC Managed Care – PPO | Admitting: Nurse Practitioner

## 2023-03-04 ENCOUNTER — Encounter: Payer: Self-pay | Admitting: Nurse Practitioner

## 2023-03-04 VITALS — BP 119/78 | HR 82 | Ht 75.0 in | Wt 323.4 lb

## 2023-03-04 DIAGNOSIS — G43119 Migraine with aura, intractable, without status migrainosus: Secondary | ICD-10-CM

## 2023-03-04 DIAGNOSIS — Z6841 Body Mass Index (BMI) 40.0 and over, adult: Secondary | ICD-10-CM | POA: Diagnosis not present

## 2023-03-04 MED ORDER — WEGOVY 2.4 MG/0.75ML ~~LOC~~ SOAJ
2.4000 mg | SUBCUTANEOUS | 2 refills | Status: DC
  Filled 2023-03-04 – 2023-04-13 (×6): qty 3, 28d supply, fill #0

## 2023-03-04 NOTE — Progress Notes (Signed)
Established patient visit   Patient: Jesus Gardner   DOB: 11/09/1990   32 y.o. Male  MRN: [id number] Visit Date: 03/04/2023   Chief Complaint  Patient presents with   Weight Check   Subjective    HPI  -follow up  Weight management  Currently on Wegovy 1.7 mg weekly  -starting weight 12/11/2022 - 326 -today's weight - 03/04/2023- 323  -weight loss since last visit - 3 pounds  -total weight loss - 3 pounds since last visit  -he does consume a low calorie diet.  -exercises frequently. Lifts weights -feels like he has not been able to get into a good and regular exercise routine.  -He denies chest pain, chest pressure, or shortness of breath. He denies headaches or visual disturbances. He denies abdominal pain, nausea, vomiting, or changes in bowel or bladder habits.    Medications: Outpatient Medications Prior to Visit  Medication Sig   acetaminophen (TYLENOL) 325 MG tablet Take 650 mg by mouth every 6 (six) hours as needed. PRN   ALPRAZolam (XANAX) 0.5 MG tablet One tablet at least 30 minutes before flying, and every four hours as needed   dicyclomine (BENTYL) 10 MG capsule Take 1 capsule (10 mg total) by mouth 3 (three) times daily as needed (stomach spasms).   eletriptan (RELPAX) 40 MG tablet Take 1 tablet (40 mg total) by mouth as needed for migraine or headache. May repeat in 2 hours if headache persists or recurs.   fexofenadine-pseudoephedrine (ALLEGRA-D 24) 180-240 MG 24 hr tablet Take 1 tablet by mouth daily.   fluticasone (FLONASE) 50 MCG/ACT nasal spray Place 2 sprays into both nostrils daily.   methocarbamol (ROBAXIN) 750 MG tablet Take 1-2 tablets (750-1,500 mg total) by mouth every 8 (eight) hours as needed for muscle spasms.   Multiple Vitamin (MULTIVITAMIN) capsule Take 1 capsule by mouth daily.   Omega-3 Fatty Acids (FISH OIL) 1000 MG CAPS Take 2 capsules by mouth 2 (two) times daily.   omeprazole (PRILOSEC) 20 MG capsule Take 1 capsule (20 mg total) by mouth daily as  needed.   SPIKEVAX syringe Inject 0.5 mLs into the muscle once.   Wheat Dextrin (BENEFIBER PO) Take by mouth daily.   [DISCONTINUED] Semaglutide-Weight Management 1.7 MG/0.75ML SOAJ Inject 1.7 mg into the skin once a week.   SUMAtriptan (IMITREX) 20 MG/ACT nasal spray Place 20 mg into the nose once. (Patient not taking: Reported on 03/04/2023)   No facility-administered medications prior to visit.    Review of Systems See HPI     Objective     Today's Vitals   03/04/23 1346  BP: 119/78  Pulse: 82  SpO2: 97%  Weight: (Abnormal) 323 lb 6.4 oz (146.7 kg)  Height: 6\' 3"  (1.905 m)   Body mass index is 40.42 kg/m.  BP Readings from Last 3 Encounters:  03/04/23 119/78  12/11/22 134/81  12/07/22 130/78    Wt Readings from Last 3 Encounters:  03/04/23 (Abnormal) 323 lb 6.4 oz (146.7 kg)  12/11/22 (Abnormal) 326 lb 6.4 oz (148.1 kg)  12/07/22 (Abnormal) 326 lb 12.8 oz (148.2 kg)    Physical Exam Vitals and nursing note reviewed.  Constitutional:      Appearance: Normal appearance. He is well-developed. He is obese.  HENT:     Head: Normocephalic and atraumatic.     Nose: Nose normal.     Mouth/Throat:     Mouth: Mucous membranes are moist.     Pharynx: Oropharynx is clear.  Eyes:  Extraocular Movements: Extraocular movements intact.     Conjunctiva/sclera: Conjunctivae normal.     Pupils: Pupils are equal, round, and reactive to light.  Neck:     Vascular: No carotid bruit.  Cardiovascular:     Rate and Rhythm: Normal rate and regular rhythm.     Pulses: Normal pulses.     Heart sounds: Normal heart sounds.  Pulmonary:     Effort: Pulmonary effort is normal.     Breath sounds: Normal breath sounds.  Abdominal:     Palpations: Abdomen is soft.  Musculoskeletal:        General: Normal range of motion.     Cervical back: Normal range of motion and neck supple.  Lymphadenopathy:     Cervical: No cervical adenopathy.  Skin:    General: Skin is warm and dry.      Capillary Refill: Capillary refill takes less than 2 seconds.  Neurological:     General: No focal deficit present.     Mental Status: He is alert and oriented to person, place, and time.  Psychiatric:        Mood and Affect: Mood normal.        Behavior: Behavior normal.        Thought Content: Thought content normal.        Judgment: Judgment normal.     Assessment & Plan    Intractable migraine with aura without status migrainosus Assessment & Plan: Relpax effective.  Take as needed and as prescribed for acute migraine headaches.   BMI 40.0-44.9, adult [facility]) Assessment & Plan: Increase Wegovy to 2.4 mg weekly.  Continue with low calorie, high-protein diet.  Incorporate exercise into daily activities.     Other orders -     Wegovy; Inject 2.4 mg into the skin once a week.  Dispense: 3 mL; Refill: 2     Return in about 2 months (around 05/04/2023) for routine - weight management.         Carlean Jews, NP  [facility] [phone] (phone) [phone] (fax)  [facility]

## 2023-03-09 ENCOUNTER — Other Ambulatory Visit: Payer: Self-pay

## 2023-03-11 ENCOUNTER — Encounter: Payer: Self-pay | Admitting: Gastroenterology

## 2023-03-12 ENCOUNTER — Other Ambulatory Visit: Payer: Self-pay

## 2023-03-15 NOTE — Assessment & Plan Note (Signed)
Increase Wegovy to 2.4 mg weekly.  Continue with low calorie, high-protein diet.  Incorporate exercise into daily activities.

## 2023-03-15 NOTE — Assessment & Plan Note (Signed)
Relpax effective.  Take as needed and as prescribed for acute migraine headaches.

## 2023-03-19 ENCOUNTER — Other Ambulatory Visit: Payer: Self-pay

## 2023-03-22 ENCOUNTER — Other Ambulatory Visit: Payer: Self-pay

## 2023-03-23 ENCOUNTER — Other Ambulatory Visit: Payer: Self-pay

## 2023-03-24 MED FILL — Methocarbamol Tab 750 MG: ORAL | 10 days supply | Qty: 60 | Fill #3 | Status: AC

## 2023-03-29 MED FILL — Dicyclomine HCl Cap 10 MG: ORAL | 10 days supply | Qty: 30 | Fill #3 | Status: AC

## 2023-04-01 DIAGNOSIS — Z31440 Encounter of male for testing for genetic disease carrier status for procreative management: Secondary | ICD-10-CM | POA: Diagnosis not present

## 2023-04-01 DIAGNOSIS — N469 Male infertility, unspecified: Secondary | ICD-10-CM | POA: Diagnosis not present

## 2023-04-13 ENCOUNTER — Other Ambulatory Visit: Payer: Self-pay

## 2023-04-23 DIAGNOSIS — R197 Diarrhea, unspecified: Secondary | ICD-10-CM | POA: Diagnosis not present

## 2023-04-23 DIAGNOSIS — K625 Hemorrhage of anus and rectum: Secondary | ICD-10-CM | POA: Diagnosis not present

## 2023-04-30 LAB — C DIFFICILE, CYTOTOXIN B

## 2023-04-30 LAB — C DIFFICILE TOXINS A+B W/RFLX: C difficile Toxins A+B, EIA: NEGATIVE

## 2023-05-01 LAB — GI PROFILE, STOOL, PCR

## 2023-05-01 LAB — CALPROTECTIN, FECAL: Calprotectin, Fecal: 68 ug/g (ref 0–120)

## 2023-05-06 ENCOUNTER — Other Ambulatory Visit: Payer: Self-pay

## 2023-06-02 ENCOUNTER — Other Ambulatory Visit: Payer: Self-pay | Admitting: Family Medicine

## 2023-06-02 ENCOUNTER — Other Ambulatory Visit: Payer: Self-pay

## 2023-06-02 DIAGNOSIS — M542 Cervicalgia: Secondary | ICD-10-CM

## 2023-06-03 ENCOUNTER — Other Ambulatory Visit: Payer: Self-pay

## 2023-06-03 MED ORDER — METHOCARBAMOL 750 MG PO TABS
750.0000 mg | ORAL_TABLET | Freq: Three times a day (TID) | ORAL | 3 refills | Status: DC | PRN
  Filled 2023-06-03: qty 60, 10d supply, fill #0

## 2023-06-11 ENCOUNTER — Other Ambulatory Visit: Payer: Self-pay

## 2023-06-11 ENCOUNTER — Telehealth: Payer: BC Managed Care – PPO | Admitting: Family Medicine

## 2023-06-11 DIAGNOSIS — H6690 Otitis media, unspecified, unspecified ear: Secondary | ICD-10-CM | POA: Diagnosis not present

## 2023-06-11 MED ORDER — AMOXICILLIN 875 MG PO TABS
875.0000 mg | ORAL_TABLET | Freq: Two times a day (BID) | ORAL | 0 refills | Status: AC
  Filled 2023-06-11: qty 20, 10d supply, fill #0

## 2023-06-11 NOTE — Progress Notes (Signed)
E-Visit for Ear Pain - Acute Otitis Media   We are sorry that you are not feeling well. Here is how we plan to help!  Based on what you have shared with me it looks like you have Acute Otitis Media.  Acute Otitis Media is an infection of the middle or "inner" ear. This type of infection can cause redness, inflammation, and fluid buildup behind the tympanic membrane (ear drum).  The usual symptoms include: Earache/Pain Fever Upper respiratory symptoms Lack of energy/Fatigue/Malaise Slight hearing loss gradually worsening- if the inner ear fills with fluid What causes middle ear infections? Most middle ear infections occur when an infection such as a cold, leads to a build-up of mucus in the middle ear and causes the Eustachian tube (a thin tube that runs from the middle ear to the back of the nose) to become swollen or blocked.   This means mucus can't drain away properly, making it easier for an infection to spread into the middle ear.  How middle ear infections are treated: Most ear infections clear up within three to five days and don't need any specific treatment. If necessary, tylenol or ibuprofen should be used to relieve pain and a high temperature.  If you develop a fever higher than 102, or any significantly worsening symptoms, this could indicate a more serious infection moving to the middle/inner and needs face to face evaluation in an office by a provider.   Antibiotics aren't routinely used to treat middle ear infections, although they may occasionally be prescribed if symptoms persist or are particularly severe. Given your presentation,   I have prescribed Amoxicillin 875 mg one tablet twice daily for 10 days     Your symptoms should improve over the next 3 days and should resolve in about 7 days. Be sure to complete ALL of the prescription(s) given.  HOME CARE: Wash your hands frequently. If you are prescribed an ear drop, do not place the tip of the bottle on your ear or  touch it with your fingers. You can take Acetaminophen 650 mg every 4-6 hours as needed for pain.  If pain is severe or moderate, you can apply a heating pad (set on low) or hot water bottle (wrapped in a towel) to outer ear for 20 minutes.  This will also increase drainage.  GET HELP RIGHT AWAY IF: Fever is over 102.2 degrees. You develop progressive ear pain or hearing loss. Ear symptoms persist longer than 3 days after treatment.  MAKE SURE YOU: Understand these instructions. Will watch your condition. Will get help right away if you are not doing well or get worse.  Thank you for choosing an e-visit.  Your e-visit answers were reviewed by a board certified advanced clinical practitioner to complete your personal care plan. Depending upon the condition, your plan could have included both over the counter or prescription medications.  Please review your pharmacy choice. Make sure the pharmacy is open so you can pick up the prescription now. If there is a problem, you may contact your provider through [employer] and have the prescription routed to another pharmacy.  Your safety is important to [place]. If you have drug allergies check your prescription carefully.   For the next 24 hours you can use MyChart to ask questions about today's visit, request a non-urgent call back, or ask for a work or school excuse. You will get an email with a survey after your eVisit asking about your experience. We would appreciate your feedback.  I hope that your e-visit has been valuable and will aid in your recovery.    have provided 5 minutes of non face to face time during this encounter for chart review and documentation.

## 2023-07-19 ENCOUNTER — Other Ambulatory Visit: Payer: Self-pay

## 2023-07-20 ENCOUNTER — Other Ambulatory Visit: Payer: Self-pay

## 2023-08-19 DIAGNOSIS — H5213 Myopia, bilateral: Secondary | ICD-10-CM | POA: Diagnosis not present

## 2023-09-05 NOTE — Patient Instructions (Incomplete)

## 2023-09-07 ENCOUNTER — Ambulatory Visit (INDEPENDENT_AMBULATORY_CARE_PROVIDER_SITE_OTHER): Payer: BC Managed Care – PPO | Admitting: Nurse Practitioner

## 2023-09-07 VITALS — BP 115/73 | HR 69 | Temp 98.7°F | Ht 75.0 in | Wt 334.6 lb

## 2023-09-07 DIAGNOSIS — E66813 Obesity, class 3: Secondary | ICD-10-CM

## 2023-09-07 DIAGNOSIS — M542 Cervicalgia: Secondary | ICD-10-CM | POA: Diagnosis not present

## 2023-09-07 DIAGNOSIS — Z6841 Body Mass Index (BMI) 40.0 and over, adult: Secondary | ICD-10-CM

## 2023-09-07 DIAGNOSIS — G43119 Migraine with aura, intractable, without status migrainosus: Secondary | ICD-10-CM | POA: Diagnosis not present

## 2023-09-07 DIAGNOSIS — E782 Mixed hyperlipidemia: Secondary | ICD-10-CM

## 2023-09-07 DIAGNOSIS — G4733 Obstructive sleep apnea (adult) (pediatric): Secondary | ICD-10-CM

## 2023-09-07 NOTE — Assessment & Plan Note (Signed)
Refer to obesity plan of care. ?

## 2023-09-07 NOTE — Assessment & Plan Note (Signed)
Noted on diagnosis list.  Will not check lipid panel today as is not fasting.  Plan to recheck in future.  Recommend heavy focus on diet and regular exercise.

## 2023-09-07 NOTE — Progress Notes (Signed)
New Patient Office Visit  Subjective    Patient ID: Jesus Gardner, male    DOB: [birth year]  Age: 32 y.o. MRN: [id number]  CC:  Chief Complaint  Patient presents with   Pain    Patient states he has had chronic pain and muscle tightness in his neck for a long time. States he takes the muscle relaxer's for this. States she also goes to massage therapy and has had acupuncture.    Migraine    Patient states he gets occasional migraines and headaches due to the neck pain and tightness he has.    Obesity    Patient states he would like to discuss weight loss, and options for weight loss. States he has been on Wegovy and only lost like 5 to 10 pounds so his insurance stopped covering the medication.     HPI Jesus Gardner presents for new patient visit to establish care.  Introduced to Publishing rights manager role and practice setting.  All questions answered.  Discussed provider/patient relationship and expectations.  Was visiting with Vincent Gros NP, but she transferred to palliative.  Works at [employer], teaches English.  His wife is [redacted] weeks pregnant.  NECK PAIN & MIGRAINES Ongoing issue with neck pain, which leads to headaches. Had imaging in 2020 with no acute findings or degenerative changes. Started with pain at beginning of pandemic, started with pain in shoulder and back -- then had numbness down leg and in face.  Was seen in ER at that time with reassuring work-up.  Previous PCP did try him on muscle relaxer which does not help much.  Now is having massage therapy which is helping.  When pain starts it is tightness in shoulders/mid-back and then radiates up into neck, a headache will follow -- was prescribed Relpax.  Relpax will ease pain off.  Did play football when younger, but no history of concussion or neck injury.  Prior to pain was lifting weights, but did not have any injuries. Status: stable Treatments attempted:  massage, rest, ice, heat, APAP, ibuprofen, aleve, and muscle relaxer  -- tried all the gels OTC Compliant with recommended treatment: yes Relief with NSAIDs?:  mild Location: alternates on which side, tightness is always in the middle Duration:months Severity: 10/10 at worst Quality: sharp, dull, aching, and throbbing - starts dull and turns sharp Frequency: intermittent -- will last 24 hours when present Radiation: headache Aggravating factors: dehydration, excessive movement Alleviating factors: massage helps for a 3 days Weakness:  no Paresthesias / decreased sensation:  no  Fevers:  no   WEIGHT GAIN Was on Wegovy but did not get to dose that may help as insurance stopped coverage.  Has always had issues with weight, fluctuates. Has done different diets in past and worked with trainer.  Lowest he can get to on own is 321 to 322 lbs.  Was diagnosed with OSA in past and told to sleep with two pillows under head.  Thinks he has a deviated septum due to past trauma to nose.  Using Breath Right strips which are helping. Duration: years Previous attempts at weight loss: yes Complications of obesity: neck pain Peak weight: 350 lbs Weight loss goal: <300 lbs and long term 250 to 260 lbs Weight loss to date: none Requesting obesity pharmacotherapy: yes Current weight loss supplements/medications: no Previous weight loss supplements/meds: yes Calories:  2000     09/07/2023   10:58 AM 03/04/2023    1:48 PM 12/11/2022    9:18 AM 10/23/2022  11:21 AM 04/24/2022    9:16 AM  Depression screen PHQ 2/9  Decreased Interest 0 1 1 1 1   Down, Depressed, Hopeless 1 1 0 1 1  PHQ - 2 Score 1 2 1 2 2   Altered sleeping 1 1 2 2  0  Tired, decreased energy 2 1 1 1  0  Change in appetite 1 0 0 0 1  Feeling bad or failure about yourself  0 0 0 0 1  Trouble concentrating 0 0 1 0 1  Moving slowly or fidgety/restless 0 0 0 0 0  Suicidal thoughts 0 0 0 0 0  PHQ-9 Score 5 4 5 5 5   Difficult doing work/chores Somewhat difficult Somewhat difficult  Somewhat difficult Not  difficult at all       09/07/2023   10:59 AM 03/04/2023    1:49 PM 12/11/2022    9:19 AM  GAD 7 : Generalized Anxiety Score  Nervous, Anxious, on Edge 1 0 1  Control/stop worrying 0 0 0  Worry too much - different things 2 1 1   Trouble relaxing 1 1 0  Restless 0 1 0  Easily annoyed or irritable 1 1 1   Afraid - awful might happen 0 0 0  Total GAD 7 Score 5 4 3   Anxiety Difficulty Somewhat difficult Not difficult at all    Outpatient Encounter Medications as of 09/07/2023  Medication Sig   acetaminophen (TYLENOL) 325 MG tablet Take 650 mg by mouth every 6 (six) hours as needed. PRN   ALPRAZolam (XANAX) 0.5 MG tablet One tablet at least 30 minutes before flying, and every four hours as needed   dicyclomine (BENTYL) 10 MG capsule Take 1 capsule (10 mg total) by mouth 3 (three) times daily as needed (stomach spasms).   eletriptan (RELPAX) 40 MG tablet Take 1 tablet (40 mg total) by mouth as needed for migraine or headache. May repeat in 2 hours if headache persists or recurs.   fexofenadine-pseudoephedrine (ALLEGRA-D 24) 180-240 MG 24 hr tablet Take 1 tablet by mouth daily.   fluticasone (FLONASE) 50 MCG/ACT nasal spray Place 2 sprays into both nostrils daily.   methocarbamol (ROBAXIN) 750 MG tablet Take 1-2 tablets (750-1,500 mg total) by mouth every 8 (eight) hours as needed for muscle spasms.   Multiple Vitamin (MULTIVITAMIN) capsule Take 1 capsule by mouth daily.   Omega-3 Fatty Acids (FISH OIL) 1000 MG CAPS Take 2 capsules by mouth 2 (two) times daily.   omeprazole (PRILOSEC) 20 MG capsule Take 1 capsule (20 mg total) by mouth daily as needed.   [DISCONTINUED] Semaglutide-Weight Management (WEGOVY) 2.4 MG/0.75ML SOAJ Inject 2.4 mg into the skin once a week.   [DISCONTINUED] SPIKEVAX syringe Inject 0.5 mLs into the muscle once.   [DISCONTINUED] SUMAtriptan (IMITREX) 20 MG/ACT nasal spray Place 20 mg into the nose once. (Patient not taking: Reported on 03/04/2023)   [DISCONTINUED] Wheat  Dextrin (BENEFIBER PO) Take by mouth daily.   No facility-administered encounter medications on file as of 09/07/2023.    Past Medical History:  Diagnosis Date   Allergy    Costochondritis 12/30/2015   Depression    Elevated transaminase level 03/12/2016   GERD (gastroesophageal reflux disease)    History of chicken pox     Past Surgical History:  Procedure Laterality Date   COLONOSCOPY WITH PROPOFOL N/A 08/20/2020   Procedure: COLONOSCOPY WITH PROPOFOL;  Surgeon: Midge Minium, MD;  Location: [facility] ENDOSCOPY;  Service: Endoscopy;  Laterality: N/A;   None  Family History  Problem Relation Age of Onset   Non-Hodgkin's lymphoma Mother 33   Cancer Mother    Early death Mother    Obesity Mother    Cerebrovascular Disease Father    Diabetes Father        type 2   CAD Father    Alcohol abuse Father    Drug abuse Father    Obesity Father    Prostate cancer Maternal Uncle    Arthritis Maternal Grandmother    Depression Maternal Grandmother    Hearing loss Maternal Grandmother    Heart disease Maternal Grandmother    Kidney disease Maternal Grandmother    Obesity Maternal Grandmother    Heart disease Maternal Grandfather    Obesity Maternal Grandfather     Social History   Socioeconomic History   Marital status: Married    Spouse name: Not on file   Number of children: Not on file   Years of education: Not on file   Highest education level: Master's degree (e.g., MA, MS, MEng, MEd, MSW, MBA)  Occupational History   Occupation: [employer]  Tobacco Use   Smoking status: Never   Smokeless tobacco: Never  Vaping Use   Vaping status: Never Used  Substance and Sexual Activity   Alcohol use: Not Currently    Comment: occasionally   Drug use: No   Sexual activity: Yes    Birth control/protection: None  Other Topics Concern   Not on file  Social History Narrative   Not on file   Social Determinants of Health   Financial Resource Strain: Low Risk  (09/07/2023)    Overall Financial Resource Strain (CARDIA)    Difficulty of Paying Living Expenses: Not hard at all  Food Insecurity: No Food Insecurity (09/07/2023)   Hunger Vital Sign    Worried About Running Out of Food in the Last Year: Never true    Ran Out of Food in the Last Year: Never true  Transportation Needs: No Transportation Needs (09/07/2023)   PRAPARE - Administrator, Civil Service (Medical): No    Lack of Transportation (Non-Medical): No  Physical Activity: Unknown (09/07/2023)   Exercise Vital Sign    Days of Exercise per Week: Patient declined    Minutes of Exercise per Session: Not on file  Stress: Stress Concern Present (09/07/2023)   [employer] of Occupational Health - Occupational Stress Questionnaire    Feeling of Stress : To some extent  Social Connections: Moderately Isolated (09/07/2023)   Social Connection and Isolation Panel [NHANES]    Frequency of Communication with Friends and Family: More than three times a week    Frequency of Social Gatherings with Friends and Family: More than three times a week    Attends Religious Services: Never    Database administrator or Organizations: No    Attends Banker Meetings: Never    Marital Status: Married  Catering manager Violence: Not on file   Review of Systems  Constitutional:  Negative for chills, diaphoresis, fever and weight loss.  Respiratory:  Negative for cough, shortness of breath and wheezing.   Cardiovascular:  Negative for chest pain, palpitations, orthopnea and leg swelling.  Gastrointestinal: Negative.   Musculoskeletal:  Positive for neck pain.  Neurological:  Positive for headaches. Negative for dizziness, tingling, sensory change, speech change and weakness.  Endo/Heme/Allergies: Negative.   Psychiatric/Behavioral: Negative.         Objective    BP 115/73   Pulse 69  Temp 98.7 F (37.1 C) (Oral)   Ht 6\' 3"  (1.905 m)   Wt (!) 334 lb 9.6 oz (151.8 kg)   SpO2 97%    BMI 41.82 kg/m   Physical Exam Vitals and nursing note reviewed.  Constitutional:      General: He is awake. He is not in acute distress.    Appearance: Normal appearance. He is well-developed and well-groomed. He is obese. He is not ill-appearing or toxic-appearing.  HENT:     Head: Normocephalic.     Right Ear: Hearing and external ear normal.     Left Ear: Hearing and external ear normal.  Eyes:     General: Lids are normal.     Extraocular Movements: Extraocular movements intact.     Conjunctiva/sclera: Conjunctivae normal.  Neck:     Thyroid: No thyromegaly.     Vascular: No carotid bruit.  Cardiovascular:     Rate and Rhythm: Normal rate and regular rhythm.     Heart sounds: Normal heart sounds. No murmur heard.    No gallop.  Pulmonary:     Effort: Pulmonary effort is normal. No accessory muscle usage or respiratory distress.     Breath sounds: Normal breath sounds. No decreased breath sounds, wheezing or rhonchi.  Abdominal:     General: Bowel sounds are normal. There is no distension.     Palpations: Abdomen is soft.     Tenderness: There is no abdominal tenderness.  Musculoskeletal:     Cervical back: No edema, signs of trauma, rigidity or crepitus. Pain with movement (pain with all ROM movements) present. Decreased range of motion (reduced lateral movement).     Right lower leg: No edema.     Left lower leg: No edema.  Lymphadenopathy:     Cervical: No cervical adenopathy.  Skin:    General: Skin is warm.     Capillary Refill: Capillary refill takes less than 2 seconds.  Neurological:     Mental Status: He is alert and oriented to person, place, and time.     Cranial Nerves: Cranial nerves 2-12 are intact.     Deep Tendon Reflexes: Reflexes are normal and symmetric.     Reflex Scores:      Brachioradialis reflexes are 2+ on the right side and 2+ on the left side.      Patellar reflexes are 2+ on the right side and 2+ on the left side. Psychiatric:         Attention and Perception: Attention normal.        Mood and Affect: Mood normal.        Speech: Speech normal.        Behavior: Behavior normal. Behavior is cooperative.        Thought Content: Thought content normal.    Last CBC Lab Results  Component Value Date   WBC 5.7 04/28/2022   HGB 14.4 04/28/2022   HCT 43.6 04/28/2022   MCV 88 04/28/2022   MCH 29.0 04/28/2022   RDW 13.2 04/28/2022   PLT 243 04/28/2022   Last metabolic panel Lab Results  Component Value Date   GLUCOSE 95 04/28/2022   NA 143 04/28/2022   K 4.6 04/28/2022   CL 105 04/28/2022   CO2 23 04/28/2022   BUN 11 04/28/2022   CREATININE 0.83 04/28/2022   EGFR 119 04/28/2022   CALCIUM 9.7 04/28/2022   PROT 6.9 04/28/2022   ALBUMIN 4.6 04/28/2022   LABGLOB 2.3 04/28/2022   AGRATIO 2.0 04/28/2022  BILITOT 0.5 04/28/2022   ALKPHOS 58 04/28/2022   AST 19 04/28/2022   ALT 23 04/28/2022   ANIONGAP 10 10/05/2019   Last lipids Lab Results  Component Value Date   CHOL 192 04/28/2022   HDL 35 (L) 04/28/2022   LDLCALC 115 (H) 04/28/2022   TRIG 240 (H) 04/28/2022   CHOLHDL 5.5 (H) 04/28/2022   Last thyroid functions Lab Results  Component Value Date   TSH 1.320 12/29/2019        Assessment & Plan:   Problem List Items Addressed This Visit       Cardiovascular and Mediastinum   Intractable migraine with aura without status migrainosus    Ongoing, unsure if this is migraine or more related to cervical spine issues.  Refer to neck pain for further on this.  Will continue Relpax at this time as is offering some benefit.  Adjust regimen as needed.      Relevant Orders   Comprehensive metabolic panel   CBC with Differential/Platelet   TSH     Respiratory   OSA (obstructive sleep apnea)    Ongoing and stable without CPAP.  Continue current at home regimen with pillows.  Consider repeat study in future if any symptoms present.        Other   BMI 40.0-44.9, adult (HCC)    Refer to obesity plan  of care.      Hyperlipidemia, mixed    Noted on diagnosis list.  Will not check lipid panel today as is not fasting.  Plan to recheck in future.  Recommend heavy focus on diet and regular exercise.      Neck pain    Chronic and ongoing for over 4 years now, no injury or event prior to pain.  Previous XR 4 years ago noted no acute or degenerative findings.  Will repeat this imaging and if reassuring consider physical therapy.  Consider MRI in future if ongoing pain, may need medication to help with this due to claustrophobia. Continue current medication regimen at home and massage therapy.  Check labs today to ensure no other underlying reason for pain.      Relevant Orders   ANA 12 Plus Profile (RDL)   C-reactive protein   Sedimentation rate   DG Cervical Spine Complete   Obesity - Primary    BMI 41.82 with OSA and neck pain.  Has tried multiple times to lose weight on own, but cannot get past 320 lbs.  He would like to lose weight and would like medication to assist, but insurance stopped covering Wegovy.  He will check with them to see if they are now covering injectables or oral medications for weight loss, if so will order.  Educated him on oral and injectable medications.  Recommended eating smaller high protein, low fat meals more frequently and exercising 30 mins a day 5 times a week with a goal of 10-15lb weight loss in the next 3 months. Patient voiced their understanding and motivation to adhere to these recommendations. Labs today, will check insulin to see if any resistance present.       Relevant Orders   Insulin, random    Return in about 4 weeks (around 10/05/2023) for NECK PAIN.   Marjie Skiff, NP

## 2023-09-07 NOTE — Assessment & Plan Note (Signed)
Chronic and ongoing for over 4 years now, no injury or event prior to pain.  Previous XR 4 years ago noted no acute or degenerative findings.  Will repeat this imaging and if reassuring consider physical therapy.  Consider MRI in future if ongoing pain, may need medication to help with this due to claustrophobia. Continue current medication regimen at home and massage therapy.  Check labs today to ensure no other underlying reason for pain.

## 2023-09-07 NOTE — Assessment & Plan Note (Signed)
Ongoing and stable without CPAP.  Continue current at home regimen with pillows.  Consider repeat study in future if any symptoms present.

## 2023-09-07 NOTE — Assessment & Plan Note (Addendum)
BMI 41.82 with OSA and neck pain.  Has tried multiple times to lose weight on own, but cannot get past 320 lbs.  He would like to lose weight and would like medication to assist, but insurance stopped covering Wegovy.  He will check with them to see if they are now covering injectables or oral medications for weight loss, if so will order.  Educated him on oral and injectable medications.  Recommended eating smaller high protein, low fat meals more frequently and exercising 30 mins a day 5 times a week with a goal of 10-15lb weight loss in the next 3 months. Patient voiced their understanding and motivation to adhere to these recommendations. Labs today, will check insulin to see if any resistance present.

## 2023-09-07 NOTE — Assessment & Plan Note (Signed)
Ongoing, unsure if this is migraine or more related to cervical spine issues.  Refer to neck pain for further on this.  Will continue Relpax at this time as is offering some benefit.  Adjust regimen as needed.

## 2023-09-08 NOTE — Progress Notes (Signed)
Contacted via MyChart   Good evening Jesus Gardner, your labs are returning.  ANA is still pending.  Overall these labs look great with exception of mild elevation in ESR, an inflammatory marker, but CRP is normal.  Overall great labs.  Any questions? Keep being amazing!!  Thank you for allowing me to participate in your care.  I appreciate you. Kindest regards, Levar Fayson

## 2023-09-15 ENCOUNTER — Encounter: Payer: Self-pay | Admitting: Nurse Practitioner

## 2023-09-15 NOTE — Progress Notes (Signed)
Contacted via MyChart   Good morning Jesus Gardner, your ANA has returned positive and with your mild elevation in ESR and a speckled patter slightly elevated at 1:160, I do recommend a referral to rheumatology which I can order once remainder of panel returns.  Would you be okay with this?  Keep being amazing!!  Thank you for allowing me to participate in your care.  I appreciate you. Kindest regards, Isabellarose Kope

## 2023-09-17 ENCOUNTER — Other Ambulatory Visit: Payer: Self-pay | Admitting: Nurse Practitioner

## 2023-09-17 DIAGNOSIS — Z8261 Family history of arthritis: Secondary | ICD-10-CM

## 2023-09-17 DIAGNOSIS — R768 Other specified abnormal immunological findings in serum: Secondary | ICD-10-CM

## 2023-09-17 DIAGNOSIS — M542 Cervicalgia: Secondary | ICD-10-CM

## 2023-09-17 NOTE — Progress Notes (Signed)
Contacted via MyChart   I placed referral today:)

## 2023-09-19 LAB — COMPREHENSIVE METABOLIC PANEL
ALT: 35 [IU]/L (ref 0–44)
AST: 21 [IU]/L (ref 0–40)
Albumin: 4.4 g/dL (ref 4.1–5.1)
Alkaline Phosphatase: 59 [IU]/L (ref 44–121)
BUN/Creatinine Ratio: 19 (ref 9–20)
BUN: 15 mg/dL (ref 6–20)
Bilirubin Total: 0.3 mg/dL (ref 0.0–1.2)
CO2: 21 mmol/L (ref 20–29)
Calcium: 9.3 mg/dL (ref 8.7–10.2)
Chloride: 104 mmol/L (ref 96–106)
Creatinine, Ser: 0.78 mg/dL (ref 0.76–1.27)
Globulin, Total: 2.5 g/dL (ref 1.5–4.5)
Glucose: 96 mg/dL (ref 70–99)
Potassium: 4.4 mmol/L (ref 3.5–5.2)
Sodium: 141 mmol/L (ref 134–144)
Total Protein: 6.9 g/dL (ref 6.0–8.5)
eGFR: 120 mL/min/{1.73_m2} (ref >59)

## 2023-09-19 LAB — ANA 12 PLUS PROFILE, POSITIVE
Anti-CCP Ab, IgG & IgA (RDL): <20 U (ref <20)
Anti-Cardiolipin Ab, IgA (RDL): <12 [APL'U]/mL (ref <12)
Anti-Cardiolipin Ab, IgG (RDL): <15 [GPL'U]/mL (ref <15)
Anti-Cardiolipin Ab, IgM (RDL): <13 [MPL'U]/mL (ref <13)
Anti-Centromere Ab (RDL): <1:40 {titer}
Anti-Chromatin Ab, IgG (RDL): <20 U (ref <20)
Anti-La (SS-B) Ab (RDL): <20 U (ref <20)
Anti-Ro (SS-A) Ab (RDL): <20 U (ref <20)
Anti-Scl-70 Ab (RDL): <20 U (ref <20)
Anti-Sm Ab (RDL): <20 U (ref <20)
Anti-TPO Ab (RDL): <9 [IU]/mL (ref <9.0)
Anti-U1 RNP Ab (RDL): <20 U (ref <20)
C3 Complement (RDL): 180 mg/dL — ABNORMAL HIGH (ref 82–167)
C4 Complement (RDL): 34 mg/dL (ref 14–44)
Homogeneous Pattern: 1:160 {titer} — ABNORMAL HIGH
Rheumatoid Factor by Turb RDL: <14 [IU]/mL (ref <14)
Speckled Pattern: 1:160 {titer} — ABNORMAL HIGH

## 2023-09-19 LAB — CBC WITH DIFFERENTIAL/PLATELET
Basophils Absolute: 0 10*3/uL (ref 0.0–0.2)
Basos: 1 %
EOS (ABSOLUTE): 0.1 10*3/uL (ref 0.0–0.4)
Eos: 2 %
Hematocrit: 44.6 % (ref 37.5–51.0)
Hemoglobin: 14.3 g/dL (ref 13.0–17.7)
Immature Grans (Abs): 0 10*3/uL (ref 0.0–0.1)
Immature Granulocytes: 0 %
Lymphocytes Absolute: 1.4 10*3/uL (ref 0.7–3.1)
Lymphs: 25 %
MCH: 28.2 pg (ref 26.6–33.0)
MCHC: 32.1 g/dL (ref 31.5–35.7)
MCV: 88 fL (ref 79–97)
Monocytes Absolute: 0.5 10*3/uL (ref 0.1–0.9)
Monocytes: 8 %
Neutrophils Absolute: 3.6 10*3/uL (ref 1.4–7.0)
Neutrophils: 64 %
Platelets: 265 10*3/uL (ref 150–450)
RBC: 5.07 x10E6/uL (ref 4.14–5.80)
RDW: 13.1 % (ref 11.6–15.4)
WBC: 5.6 10*3/uL (ref 3.4–10.8)

## 2023-09-19 LAB — C-REACTIVE PROTEIN: CRP: 4 mg/L (ref 0–10)

## 2023-09-19 LAB — SEDIMENTATION RATE: Sed Rate: 26 mm/h — ABNORMAL HIGH (ref 0–15)

## 2023-09-19 LAB — INSULIN, RANDOM: INSULIN: 10.4 u[IU]/mL (ref 2.6–24.9)

## 2023-09-19 LAB — TSH: TSH: 1.26 u[IU]/mL (ref 0.450–4.500)

## 2023-09-19 LAB — ANA 12 PLUS PROFILE (RDL): Anti-Nuclear Ab by IFA (RDL): POSITIVE — AB

## 2023-10-01 ENCOUNTER — Ambulatory Visit
Admission: RE | Admit: 2023-10-01 | Discharge: 2023-10-01 | Disposition: A | Discharge status: Home / Self Care | Payer: BC Managed Care – PPO | Source: Ambulatory Visit | Attending: Nurse Practitioner | Admitting: Nurse Practitioner

## 2023-10-01 DIAGNOSIS — G8929 Other chronic pain: Secondary | ICD-10-CM | POA: Diagnosis not present

## 2023-10-01 DIAGNOSIS — M542 Cervicalgia: Secondary | ICD-10-CM

## 2023-10-05 ENCOUNTER — Telehealth: Payer: Self-pay | Admitting: Gastroenterology

## 2023-10-05 NOTE — Telephone Encounter (Signed)
I called the patient and scheduled an appointment. I sent out an appointment reminder with a No-Show letter and a map. I contacted the patient to verify and update the information in his chart, including the insurance guarantor and other relevant details.

## 2023-10-05 NOTE — Patient Instructions (Signed)
Cervical Radiculopathy  Cervical radiculopathy happens when a nerve in the neck (a cervical nerve) is pinched or bruised. This condition can happen because of an injury to the cervical spine (vertebrae) in the neck, or as part of the normal aging process. Pressure on the cervical nerves can cause pain or numbness that travels from the neck all the way down to the arm and fingers. This condition usually gets better with rest. Treatment may be needed if the condition does not improve. What are the causes? This condition may be caused by: A neck injury. A bulging (herniated) disk. Muscle spasms. Muscle tightness in the neck due to overuse. Arthritis. Breakdown or degeneration in the bones and joints of the spine (spondylosis) due to aging. Bone spurs that may develop near the cervical nerves. What are the signs or symptoms? Symptoms of this condition include: Pain. The pain may travel from the neck to the arm and hand. The pain can be severe or irritating. It may get worse when you move your neck. Numbness or tingling in your arm or hand. Weakness in the affected arm and hand, in severe cases. How is this diagnosed? This condition may be diagnosed based on your symptoms, your medical history, and a physical exam. You may also have tests, including: X-rays. CT scan. MRI. Electromyogram (EMG). Nerve conduction tests. How is this treated? In many cases, treatment is not needed for this condition. With rest, the condition usually gets better over time. If treatment is needed, options may include: Wearing a soft neck collar (cervical collar) for short periods of time. Doing physical therapy to strengthen your neck muscles. Taking medicines. These may include NSAIDs, such as ibuprofen, or oral corticosteroids. Having spinal injections, in severe cases. Having surgery. This may be needed if other treatments do not help. Different types of surgery may be done depending on the cause of this  condition. Follow these instructions at home: If you have a cervical collar: Wear it as told by your health care provider. Remove it only as told by your health care provider. Ask your health care provider if you can remove the cervical collar for cleaning and bathing. If you are allowed to remove the collar for cleaning or bathing: Follow instructions from your health care provider about how to remove the collar safely. Clean the collar by wiping it with mild soap and water and drying it completely. Take out any removable pads in the collar every 1-2 days, and wash them by hand with soap and water. Let them air-dry completely before you put them back in the collar. Check your skin under the collar for irritation or sores. If you see any, tell your health care provider. Managing pain     Take over-the-counter and prescription medicines only as told by your health care provider. If directed, put ice on the affected area. To do this: If you have a soft neck collar, remove it as told by your health care provider. Put ice in a plastic bag. Place a towel between your skin and the bag. Leave the ice on for 20 minutes, 2-3 times a day. Remove the ice if your skin turns bright red. This is very important. If you cannot feel pain, heat, or cold, you have a greater risk of damage to the area. If applying ice does not help, you can try using heat. Use the heat source that your health care provider recommends, such as a moist heat pack or a heating pad. Place a towel between   your skin and the heat source. Leave the heat on for 20-30 minutes. Remove the heat if your skin turns bright red. This is especially important if you are unable to feel pain, heat, or cold. You have a greater risk of getting burned. Try a gentle neck and shoulder massage to help relieve symptoms. Activity Rest as needed. Return to your normal activities as told by your health care provider. Ask your health care provider what  activities are safe for you. Do stretching and strengthening exercises as told by your health care provider or your physical therapist. You may have to avoid lifting. Ask your health care provider how much you can safely lift. General instructions Use a flat pillow when you sleep. Do not drive while wearing a cervical collar. If you do not have a cervical collar, ask your health care provider if it is safe to drive while your neck heals. Ask your health care provider if the medicine prescribed to you requires you to avoid driving or using machinery. Do not use any products that contain nicotine or tobacco. These products include cigarettes, chewing tobacco, and vaping devices, such as e-cigarettes. If you need help quitting, ask your health care provider. Keep all follow-up visits. This is important. Contact a health care provider if: Your condition does not improve with treatment. Get help right away if: Your pain gets much worse and is not controlled with medicines. You have weakness or numbness in your hand, arm, face, or leg. You have a high fever. You have a stiff, rigid neck. You lose control of your bowels or your bladder (have incontinence). You have trouble with walking, balance, or speaking. Summary Cervical radiculopathy happens when a nerve in the neck is pinched or bruised. A nerve can get pinched from a bulging disk, arthritis, muscle spasms, or an injury to the neck. Symptoms include pain, tingling, or numbness radiating from the neck to the arm or hand. Weakness can also occur in severe cases. Treatment may include rest, wearing a cervical collar, and physical therapy. Medicines may be prescribed to help with pain. In severe cases, injections or surgery may be needed. This information is not intended to replace advice given to you by your health care provider. Make sure you discuss any questions you have with your health care provider. Document Revised: 04/03/2021 Document  Reviewed: 04/03/2021 [employer] Patient Education  2024 [employer]  

## 2023-10-08 ENCOUNTER — Encounter: Payer: Self-pay | Admitting: Nurse Practitioner

## 2023-10-08 ENCOUNTER — Ambulatory Visit (INDEPENDENT_AMBULATORY_CARE_PROVIDER_SITE_OTHER): Payer: BC Managed Care – PPO | Admitting: Nurse Practitioner

## 2023-10-08 VITALS — BP 116/71 | HR 68 | Temp 98.2°F | Ht 75.0 in | Wt 338.2 lb

## 2023-10-08 DIAGNOSIS — M542 Cervicalgia: Secondary | ICD-10-CM | POA: Diagnosis not present

## 2023-10-08 NOTE — Assessment & Plan Note (Signed)
Chronic and ongoing for over 4 years now, no injury or event prior to pain.  Waiting on results of current imaging, appears to be very mild arthritic changes.  Consider MRI in future if ongoing pain, may need medication to help with this due to claustrophobia. Continue current medication regimen at home and massage therapy.  Would benefit PT, but currently schedule very busy.  He will alert PCP if wishes to pursue this.

## 2023-10-08 NOTE — Progress Notes (Signed)
BP 116/71   Pulse 68   Temp 98.2 F (36.8 C) (Oral)   Ht 6\' 3"  (1.905 m)   Wt (!) 338 lb 3.2 oz (153.4 kg)   SpO2 98%   BMI 42.27 kg/m    Subjective:    Patient ID: Jesus Gardner, male    DOB: 1991-09-25, 32 y.o.   MRN: [id number]  HPI: Jesus Gardner is a 32 y.o. male  Chief Complaint  Patient presents with   Neck Pain    4 week f/up- patient states that his neck pain is not any better   NECK PAIN FOLLOW UP Follow-up today for neck pain.  Did have + ANA on recent labs and a referral to rheumatology was placed as as family history of autoimmune disease (family has RA). Is scheduled for April with them. Taking muscle relaxer more regularly, helps a little bit.  Eases it off, but every once and awhile it still catches.  Continues to do massages, but has not had in awhile.   Status: stable Treatments attempted: muscle relaxer  Compliant with recommended treatment: yes Relief with NSAIDs?:  moderate Location: alternates side to side Duration:months Severity: 10/10 at worst Quality: dull and aching, tight Frequency: a few times a year Radiation: headache Aggravating factors: lifting and movement Alleviating factors: muscle relaxer, massage, and aleeve Weakness:  no Paresthesias / decreased sensation:  no  Fevers:  no   Relevant past medical, surgical, family and social history reviewed and updated as indicated. Interim medical history since our last visit reviewed. Allergies and medications reviewed and updated.  Review of Systems  Constitutional:  Negative for activity change, diaphoresis, fatigue and fever.  Respiratory:  Negative for cough, chest tightness, shortness of breath and wheezing.   Cardiovascular:  Negative for chest pain, palpitations and leg swelling.  Gastrointestinal: Negative.   Musculoskeletal:  Positive for neck pain.  Neurological: Negative.   Psychiatric/Behavioral: Negative.      Per HPI unless specifically indicated above     Objective:     BP 116/71   Pulse 68   Temp 98.2 F (36.8 C) (Oral)   Ht 6\' 3"  (1.905 m)   Wt (!) 338 lb 3.2 oz (153.4 kg)   SpO2 98%   BMI 42.27 kg/m   Wt Readings from Last 3 Encounters:  10/08/23 (!) 338 lb 3.2 oz (153.4 kg)  09/07/23 (!) 334 lb 9.6 oz (151.8 kg)  03/04/23 (!) 323 lb 6.4 oz (146.7 kg)    Physical Exam Vitals and nursing note reviewed.  Constitutional:      General: He is awake. He is not in acute distress.    Appearance: Normal appearance. He is well-developed and well-groomed. He is obese. He is not ill-appearing or toxic-appearing.  HENT:     Head: Normocephalic.     Right Ear: Hearing and external ear normal.     Left Ear: Hearing and external ear normal.  Eyes:     General: Lids are normal.     Extraocular Movements: Extraocular movements intact.     Conjunctiva/sclera: Conjunctivae normal.  Neck:     Thyroid: No thyromegaly.     Vascular: No carotid bruit.  Cardiovascular:     Rate and Rhythm: Normal rate and regular rhythm.     Heart sounds: Normal heart sounds. No murmur heard.    No gallop.  Pulmonary:     Effort: Pulmonary effort is normal. No accessory muscle usage or respiratory distress.     Breath sounds:  Normal breath sounds. No decreased breath sounds, wheezing or rhonchi.  Abdominal:     General: Bowel sounds are normal. There is no distension.     Palpations: Abdomen is soft.     Tenderness: There is no abdominal tenderness.  Musculoskeletal:     Cervical back: No edema, signs of trauma, rigidity or crepitus. Pain with movement (pain with all ROM movements) present. Decreased range of motion (reduced lateral movement).     Right lower leg: No edema.     Left lower leg: No edema.  Lymphadenopathy:     Cervical: No cervical adenopathy.  Skin:    General: Skin is warm.     Capillary Refill: Capillary refill takes less than 2 seconds.  Neurological:     Mental Status: He is alert and oriented to person, place, and time.     Cranial Nerves:  Cranial nerves 2-12 are intact.     Deep Tendon Reflexes: Reflexes are normal and symmetric.     Reflex Scores:      Brachioradialis reflexes are 2+ on the right side and 2+ on the left side.      Patellar reflexes are 2+ on the right side and 2+ on the left side. Psychiatric:        Attention and Perception: Attention normal.        Mood and Affect: Mood normal.        Speech: Speech normal.        Behavior: Behavior normal. Behavior is cooperative.        Thought Content: Thought content normal.    Results for orders placed or performed in visit on 09/07/23  Comprehensive metabolic panel   Collection Time: 09/07/23 11:47 AM  Result Value Ref Range   Glucose 96 70 - 99 mg/dL   BUN 15 6 - 20 mg/dL   Creatinine, Ser 1.61 0.76 - 1.27 mg/dL   eGFR 096 >04 VW/UJW/1.19   BUN/Creatinine Ratio 19 9 - 20   Sodium 141 134 - 144 mmol/L   Potassium 4.4 3.5 - 5.2 mmol/L   Chloride 104 96 - 106 mmol/L   CO2 21 20 - 29 mmol/L   Calcium 9.3 8.7 - 10.2 mg/dL   Total Protein 6.9 6.0 - 8.5 g/dL   Albumin 4.4 4.1 - 5.1 g/dL   Globulin, Total 2.5 1.5 - 4.5 g/dL   Bilirubin Total 0.3 0.0 - 1.2 mg/dL   Alkaline Phosphatase 59 44 - 121 IU/L   AST 21 0 - 40 IU/L   ALT 35 0 - 44 IU/L  CBC with Differential/Platelet   Collection Time: 09/07/23 11:47 AM  Result Value Ref Range   WBC 5.6 3.4 - 10.8 x10E3/uL   RBC 5.07 4.14 - 5.80 x10E6/uL   Hemoglobin 14.3 13.0 - 17.7 g/dL   Hematocrit 14.7 82.9 - 51.0 %   MCV 88 79 - 97 fL   MCH 28.2 26.6 - 33.0 pg   MCHC 32.1 31.5 - 35.7 g/dL   RDW 56.2 13.0 - 86.5 %   Platelets 265 150 - 450 x10E3/uL   Neutrophils 64 Not Estab. %   Lymphs 25 Not Estab. %   Monocytes 8 Not Estab. %   Eos 2 Not Estab. %   Basos 1 Not Estab. %   Neutrophils Absolute 3.6 1.4 - 7.0 x10E3/uL   Lymphocytes Absolute 1.4 0.7 - 3.1 x10E3/uL   Monocytes Absolute 0.5 0.1 - 0.9 x10E3/uL   EOS (ABSOLUTE) 0.1 0.0 - 0.4 x10E3/uL  Basophils Absolute 0.0 0.0 - 0.2 x10E3/uL   Immature  Granulocytes 0 Not Estab. %   Immature Grans (Abs) 0.0 0.0 - 0.1 x10E3/uL  TSH   Collection Time: 09/07/23 11:47 AM  Result Value Ref Range   TSH 1.260 0.450 - 4.500 uIU/mL  ANA 12 Plus Profile (RDL)   Collection Time: 09/07/23 11:47 AM  Result Value Ref Range   Anti-Nuclear Ab by IFA (RDL) Positive (A) Negative  C-reactive protein   Collection Time: 09/07/23 11:47 AM  Result Value Ref Range   CRP 4 0 - 10 mg/L  Sedimentation rate   Collection Time: 09/07/23 11:47 AM  Result Value Ref Range   Sed Rate 26 (H) 0 - 15 mm/hr  Insulin, random   Collection Time: 09/07/23 11:47 AM  Result Value Ref Range   INSULIN 10.4 2.6 - 24.9 uIU/mL  ANA 12 Plus Profile, Positive   Collection Time: 09/07/23 11:47 AM  Result Value Ref Range   Homogeneous Pattern 1:160 (H) <1:40   Speckled Pattern 1:160 (H) <1:40   Note: Comment    Anti-Centromere Ab (RDL) <1:40 <1:40   Anti-dsDNA Ab by Farr(RDL) <8.0 <8.0 IU/mL   Anti-Sm Ab (RDL) <20 <20 Units   Anti-U1 RNP Ab (RDL) <20 <20 Units   Anti-Ro (SS-A) Ab (RDL) <20 <20 Units   Anti-La (SS-B) Ab (RDL) <20 <20 Units   Anti-Scl-70 Ab (RDL) <20 <20 Units   Anti-Cardiolipin Ab, IgG (RDL) <15 <15 GPL U/mL   Anti-Cardiolipin Ab, IgA (RDL) <12 <12 APL U/mL   Anti-Cardiolipin Ab, IgM (RDL) <13 <13 MPL U/mL   C3 Complement (RDL) 180 (H) 82 - 167 mg/dL   C4 Complement (RDL) 34 14 - 44 mg/dL   Anti-TPO Ab (RDL) <5.9 <9.0 IU/mL   Anti-Chromatin Ab, IgG (RDL) <20 <20 Units   Anti-CCP Ab, IgG & IgA (RDL) <20 <20 Units   Rheumatoid Factor by Turb RDL <14 <14 IU/mL   ANA Plus 12 Interpretation Comment       Assessment & Plan:   Problem List Items Addressed This Visit       Other   Neck pain - Primary   Chronic and ongoing for over 4 years now, no injury or event prior to pain.  Waiting on results of current imaging, appears to be very mild arthritic changes.  Consider MRI in future if ongoing pain, may need medication to help with this due to  claustrophobia. Continue current medication regimen at home and massage therapy.  Would benefit PT, but currently schedule very busy.  He will alert PCP if wishes to pursue this.        Follow up plan: Return in about 6 months (around 03/29/2024) for NECK PAIN AND + ANA.

## 2023-10-11 NOTE — Progress Notes (Signed)
Contacted via MyChart   Good morning Jesus Gardner, your neck x-ray returned and shows no acute findings to explain pain.  If continues and no improvement then next step would be MRI to further assess.:)

## 2023-10-14 ENCOUNTER — Encounter: Payer: Self-pay | Admitting: Nurse Practitioner

## 2023-10-14 DIAGNOSIS — M542 Cervicalgia: Secondary | ICD-10-CM

## 2023-11-08 ENCOUNTER — Ambulatory Visit: Payer: BC Managed Care – PPO | Attending: Nurse Practitioner

## 2023-11-08 ENCOUNTER — Encounter: Payer: Self-pay | Admitting: Nurse Practitioner

## 2023-11-08 DIAGNOSIS — M542 Cervicalgia: Secondary | ICD-10-CM

## 2023-11-08 DIAGNOSIS — G43119 Migraine with aura, intractable, without status migrainosus: Secondary | ICD-10-CM

## 2023-11-08 NOTE — Therapy (Signed)
OUTPATIENT PHYSICAL THERAPY CERVICAL EVALUATION   Patient Name: Jesus Gardner MRN: [id number] DOB:April 05, 1991, 33 y.o., male Today's Date: 11/08/2023  END OF SESSION:  PT End of Session - 11/08/23 1119     Visit Number 1    Number of Visits 17    Date for PT Re-Evaluation 01/07/24    PT Start Time 1120    PT Stop Time 1209    PT Time Calculation (min) 49 min    Activity Tolerance Patient tolerated treatment well    Behavior During Therapy [facility] for tasks assessed/performed             Past Medical History:  Diagnosis Date   Allergy    Costochondritis 12/30/2015   Depression    Elevated transaminase level 03/12/2016   GERD (gastroesophageal reflux disease)    History of chicken pox    Past Surgical History:  Procedure Laterality Date   COLONOSCOPY WITH PROPOFOL N/A 08/20/2020   Procedure: COLONOSCOPY WITH PROPOFOL;  Surgeon: Midge Minium, MD;  Location[facility] ENDOSCOPY;  Service: Endoscopy;  Laterality: N/A;   None     Patient Active Problem List   Diagnosis Date Noted   Neck pain 09/07/2023   Intractable migraine with aura without status migrainosus 01/17/2023   BMI 40.0-44.9, adult (HCC) 01/17/2023   Diverticulosis 03/21/2019   OSA (obstructive sleep apnea) 12/06/2018   Allergic rhinitis 12/30/2015   Motion sickness 12/30/2015   Obesity 12/30/2015   Hyperlipidemia, mixed 03/06/2009   Fam hx-ischem heart disease 10/12/1998    PCP: Marjie Skiff, NP  REFERRING PROVIDER: Marjie Skiff, NP  REFERRING DIAG: M54.2 (ICD-10-CM) - Neck pain  THERAPY DIAG:  Cervicalgia - Plan: PT plan of care cert/re-cert  Rationale for Evaluation and Treatment: Rehabilitation  ONSET DATE: 2020  SUBJECTIVE:                                                                                                                                                                                                         SUBJECTIVE STATEMENT: Neck (posterior and B upper trap and B  medial scapular area): 3/10 currently, 7/10 at most for the past 3 months (tried working out again and has not gotten a massage).   Hand dominance: Right  PERTINENT HISTORY:  Neck pain. Pain began around 2020. Had L upper trap and L lateral neck pain. At one time around 2020, pt woke up one day with L UE and LE pain. Went to the ER, pt did not have a CVA, or a hear attack. Pt was given muscle relaxer  which did not help. Could not hold things with L hand and pt would drop things. Had massage therapy L upper trap area which helped. Currently has neck and B upper trap and B medial shoulder blade pain (scalene muscle referral pattern). Gets B posterior neck muscle tightness which causes cervicogenic headache.    No latex allergies  PAIN:  Are you having pain? Yes: NPRS scale: 3/10 Pain location: Neck (posterior and B upper trap and B medial scapular area) Pain description: tight, sharp at times, constant dull ache Aggravating factors: heat, turning his head, looking up, extraneous activity such as yard work, lifting weights  Relieving factors: looking down, sitting with his hands supporting his head, sleep, massage, ice  PRECAUTIONS: No known precautions  RED FLAGS: Bowel or bladder incontinence: No and Cauda equina syndrome: No     WEIGHT BEARING RESTRICTIONS: No  FALLS:  Has patient fallen in last 6 months? 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   PLOF: Independent  PATIENT GOALS: pain relief  NEXT MD VISIT: 04/12/2024  OBJECTIVE:  Note: Objective measures were completed at Evaluation unless otherwise noted.  DIAGNOSTIC FINDINGS:  DG Cervical Spine Complete 09/30/2024 Narrative & Impression  CLINICAL DATA:  Chronic neck pain   EXAM: CERVICAL SPINE - COMPLETE 4+ VIEW   COMPARISON:  03/29/2019   FINDINGS: There is no evidence of cervical spine fracture or prevertebral soft tissue swelling. Alignment is normal. No other significant  bone abnormalities are identified.   IMPRESSION: Negative cervical spine radiographs.     Electronically Signed   By: Judie Petit.  Shick M.D.   On: 10/10/2023 15:50            PATIENT SURVEYS:  NDI 17 (11/08/2023)  COGNITION: Overall cognitive status: Within functional limits for tasks assessed  SENSATION: WFL  POSTURE: cervical protraction, movement preference around C5-C6 area, B protracted shoulders, L lateral shift.  PALPATION: Cervical paraspinal muscle tension, B upper trap muscle tension R > L, decreased caudal glide R and L first rib  Decreased CPA mid to upper thoracic spine with reproduction of symptoims.     CERVICAL ROM:   Active ROM A/PROM (deg) eval  Flexion Full with B cervical paraspinal muscle pulling  Extension Limited with L medial scapular pulling and pain, and cervical spine pain.   Right lateral flexion WFL with L lateral neck tightness  Left lateral flexion WFL with R lateral neck tightness  Right rotation 65 with neck tightness  Left rotation 73 with neck tightness   (Blank rows = not tested)  UPPER EXTREMITY ROM:  Active ROM Right eval Left eval  Shoulder flexion Limited with medial scapular pain Limited with medial scapular pain  Shoulder extension    Shoulder abduction    Shoulder adduction    Shoulder extension    Shoulder internal rotation    Shoulder external rotation    Elbow flexion    Elbow extension    Wrist flexion    Wrist extension    Wrist ulnar deviation    Wrist radial deviation    Wrist pronation    Wrist supination     (Blank rows = not tested)  UPPER EXTREMITY MMT:  MMT Right eval Left eval  Shoulder flexion 4 4  Shoulder extension    Shoulder abduction 4 (with R lateral shoulder and arm pain) 4- with L shoulder pull  Shoulder adduction    Shoulder extension    Shoulder internal rotation 4- 4-  Shoulder external rotation  4 with R lateral shoulder pain 4-  Middle trapezius 4- (with R lateral arm pain)  4-  Lower trapezius 4- (with R lateral arm pain) 3+  Elbow flexion    Elbow extension    Wrist flexion    Wrist extension    Wrist ulnar deviation    Wrist radial deviation    Wrist pronation    Wrist supination    Grip strength     (Blank rows = not tested)  CERVICAL SPECIAL TESTS:   VBI testing: positive R side with pt reports of tunnel vision  Negative for L side. Pt was recommended to contact his PCP. Pt verbalized understanding.    FUNCTIONAL TESTS:    TREATMENT DATE: 11/08/2023                                                                                                                                 PATIENT EDUCATION:  Education details: POC Person educated: Patient Education method: Explanation Education comprehension: verbalized understanding  HOME EXERCISE PROGRAM:   ASSESSMENT:  CLINICAL IMPRESSION: Patient is a 33 y.o. male who was seen today for physical therapy evaluation and treatment for neck pain. Pt also demonstrates altered posture, B cervical paraspinal and upper trap muscle tension, decreased anterior cervical, B lower, middle, ER, and IR strength, TTP to mid back, decreased thoracic mobility, and difficulty performing tasks which involve looking up, turning his head, as well as lifting secondary to pain. Pt will benefit from skilled physical therapy services to address the aforementioned deficits.       OBJECTIVE IMPAIRMENTS: decreased ROM, decreased strength, improper body mechanics, postural dysfunction, and pain.   ACTIVITY LIMITATIONS: carrying, lifting, and reach over head  PARTICIPATION LIMITATIONS:   PERSONAL FACTORS: Fitness, Profession, Time since onset of injury/illness/exacerbation, and 1 comorbidity: depression  are also affecting patient's functional outcome.   REHAB POTENTIAL: Fair    CLINICAL DECISION MAKING: Stable/uncomplicated  EVALUATION COMPLEXITY: Low   GOALS: Goals reviewed with patient? Yes  SHORT TERM GOALS:  Target date: 11/19/2023  Pt will be independent with his initial HEP to decrease pain, improve strength, function, ability to look around as well as lift without neck pain.  Baseline: Pt has not yet started his initial HEP (11/08/2023) Goal status: INITIAL    LONG TERM GOALS: Target date: 01/07/2024  Pt will have a decrease in neck pain to 3/10 or less at worst to promote ability to look around, lift, perform yard work as well as work out at [employer] more comfortably for his neck.  Baseline: 7/10 at most for the past 3 months (11/08/2023) Goal status: INITIAL  2.  Pt will improve B lower, middle, ER, IR strength by at least 1/2 MMT grade to promote ability to look around, and lift with less neck pain.  Baseline:  MMT Right eval Left eval  Shoulder internal rotation 4- 4-  Shoulder external rotation 4 with R lateral shoulder pain  4-  Middle trapezius 4- (with R lateral arm pain) 4-  Lower trapezius 4- (with R lateral arm pain) 3+   Goal status: INITIAL  3.  Pt will have a decrease in Neck Disability Index (NDI) score by at least 7 points as a demonstration of improved function.  Baseline: NDI score of 17 suggesting moderate disability (11/08/2023) Goal status: INITIAL     PLAN:  PT FREQUENCY: 1-2x/week  PT DURATION: 8 weeks  PLANNED INTERVENTIONS: 97110-Therapeutic exercises, 97530- Therapeutic activity, 97112- Neuromuscular re-education, 97535- Self Care, 16109- Manual therapy, 97014- Electrical stimulation (unattended), and 97033- Ionotophoresis 4mg /ml Dexamethasone  PLAN FOR NEXT SESSION: posture, anterior cervical, lower trap strengthening, manual techniques, modalities PRN   Bronte Sabado, PT, DPT 11/08/2023, 2:18 PM

## 2023-11-10 ENCOUNTER — Ambulatory Visit: Payer: BC Managed Care – PPO

## 2023-11-10 DIAGNOSIS — M542 Cervicalgia: Secondary | ICD-10-CM | POA: Diagnosis not present

## 2023-11-10 NOTE — Therapy (Addendum)
OUTPATIENT PHYSICAL THERAPY TREATMENT  Patient Name: Jesus Gardner MRN: [id number] DOB:[birth year], 33 y.o., male Today's Date: 11/10/2023  END OF SESSION:  PT End of Session - 11/10/23 1117     Visit Number 2    Number of Visits 17    Date for PT Re-Evaluation 01/07/24    PT Start Time 1117    PT Stop Time 1212    PT Time Calculation (min) 55 min    Activity Tolerance Patient tolerated treatment well    Behavior During Therapy [facility] for tasks assessed/performed              Past Medical History:  Diagnosis Date   Allergy    Costochondritis 12/30/2015   Depression    Elevated transaminase level 03/12/2016   GERD (gastroesophageal reflux disease)    History of chicken pox    Past Surgical History:  Procedure Laterality Date   COLONOSCOPY WITH PROPOFOL N/A 08/20/2020   Procedure: COLONOSCOPY WITH PROPOFOL;  Surgeon: Midge Minium, MD;  Location: [facility] ENDOSCOPY;  Service: Endoscopy;  Laterality: N/A;   None     Patient Active Problem List   Diagnosis Date Noted   Neck pain 09/07/2023   Intractable migraine with aura without status migrainosus 01/17/2023   BMI 40.0-44.9, adult (HCC) 01/17/2023   Diverticulosis 03/21/2019   OSA (obstructive sleep apnea) 12/06/2018   Allergic rhinitis 12/30/2015   Motion sickness 12/30/2015   Obesity 12/30/2015   Hyperlipidemia, mixed 03/06/2009   Fam hx-ischem heart disease 10/12/1998    PCP: Marjie Skiff, NP  REFERRING PROVIDER: Marjie Skiff, NP  REFERRING DIAG: M54.2 (ICD-10-CM) - Neck pain  THERAPY DIAG:  Cervicalgia  Rationale for Evaluation and Treatment: Rehabilitation  ONSET DATE: 2020  SUBJECTIVE:                                                                                                                                                                                                         SUBJECTIVE STATEMENT: Getting a CT next Wednesday. Neck feels the same, 3/10 dull ache currently (B upper  trap)    Hand dominance: Right  PERTINENT HISTORY:  Neck pain. Pain began around 2020. Had L upper trap and L lateral neck pain. At one time around 2020, pt woke up one day with L UE and LE pain. Went to the ER, pt did not have a CVA, or a hear attack. Pt was given muscle relaxer which did not help. Could not hold things with L hand and pt would drop things. Had massage therapy L upper trap  area which helped. Currently has neck and B upper trap and B medial shoulder blade pain (scalene muscle referral pattern). Gets B posterior neck muscle tightness which causes cervicogenic headache.    No latex allergies  PAIN:  Are you having pain? Yes: NPRS scale: 3/10 Pain location: Neck (posterior and B upper trap and B medial scapular area) Pain description: tight, sharp at times, constant dull ache Aggravating factors: heat, turning his head, looking up, extraneous activity such as yard work, lifting weights  Relieving factors: looking down, sitting with his hands supporting his head, sleep, massage, ice  PRECAUTIONS: No known precautions  RED FLAGS: Bowel or bladder incontinence: No and Cauda equina syndrome: No     WEIGHT BEARING RESTRICTIONS: No  FALLS:  Has patient fallen in last 6 months? 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   PLOF: Independent  PATIENT GOALS: pain relief  NEXT MD VISIT: 04/12/2024  OBJECTIVE:  Note: Objective measures were completed at Evaluation unless otherwise noted.  DIAGNOSTIC FINDINGS:  DG Cervical Spine Complete 09/30/2024 Narrative & Impression  CLINICAL DATA:  Chronic neck pain   EXAM: CERVICAL SPINE - COMPLETE 4+ VIEW   COMPARISON:  03/29/2019   FINDINGS: There is no evidence of cervical spine fracture or prevertebral soft tissue swelling. Alignment is normal. No other significant bone abnormalities are identified.   IMPRESSION: Negative cervical spine radiographs.     Electronically Signed   By: Judie Petit.   Shick M.D.   On: 10/10/2023 15:50            PATIENT SURVEYS:  NDI 17 (11/08/2023)  COGNITION: Overall cognitive status: Within functional limits for tasks assessed  SENSATION: WFL  POSTURE: cervical protraction, movement preference around C5-C6 area, B protracted shoulders, L lateral shift.  PALPATION: Cervical paraspinal muscle tension, B upper trap muscle tension R > L, decreased caudal glide R and L first rib  Decreased CPA mid to upper thoracic spine with reproduction of symptoims.     CERVICAL ROM:   Active ROM A/PROM (deg) eval  Flexion Full with B cervical paraspinal muscle pulling  Extension Limited with L medial scapular pulling and pain, and cervical spine pain.   Right lateral flexion WFL with L lateral neck tightness  Left lateral flexion WFL with R lateral neck tightness  Right rotation 65 with neck tightness  Left rotation 73 with neck tightness   (Blank rows = not tested)  UPPER EXTREMITY ROM:  Active ROM Right eval Left eval  Shoulder flexion Limited with medial scapular pain Limited with medial scapular pain  Shoulder extension    Shoulder abduction    Shoulder adduction    Shoulder extension    Shoulder internal rotation    Shoulder external rotation    Elbow flexion    Elbow extension    Wrist flexion    Wrist extension    Wrist ulnar deviation    Wrist radial deviation    Wrist pronation    Wrist supination     (Blank rows = not tested)  UPPER EXTREMITY MMT:  MMT Right eval Left eval  Shoulder flexion 4 4  Shoulder extension    Shoulder abduction 4 (with R lateral shoulder and arm pain) 4- with L shoulder pull  Shoulder adduction    Shoulder extension    Shoulder internal rotation 4- 4-  Shoulder external rotation 4 with R lateral shoulder pain 4-  Middle trapezius 4- (with R lateral arm pain) 4-  Lower trapezius 4- (with  R lateral arm pain) 3+  Elbow flexion    Elbow extension    Wrist flexion    Wrist extension     Wrist ulnar deviation    Wrist radial deviation    Wrist pronation    Wrist supination    Grip strength     (Blank rows = not tested)  CERVICAL SPECIAL TESTS:   VBI testing: positive R side with pt reports of tunnel vision  Negative for L side. Pt was recommended to contact his PCP. Pt verbalized understanding.    FUNCTIONAL TESTS:    TREATMENT DATE: 11/10/2023                                                                                                                               Therapeutic exercise  First rib stretch with strap   With cervical rotation   R 10x3   L 10x3   With contralateral cervical side bend   R 30 seconds for 3 sets   L 30 seconds for 3 sets   Seated manually resisted scapular retraction   R 10x5 seconds   L 10x5 seconds    L cheek pressure sensation, ends with rest  Seated chin tucks 2x5 seconds  Supine chin tucks 10x5 seconds   Chin tuck with cervical rotatoin 4x  Supine open books 10x5 seconds  Seated scapular depression isometrics PT manual resistance  R 10x5 seconds for 2 sets  L 10x5 seconds for 2 sets  Standing B shoulder extension with green band 10x5 seconds for 2 sets    Improved exercise technique, movement at target joints, use of target muscles after mod verbal, visual, tactile cues.     PATIENT EDUCATION:  Education details: There-ex, HEP Person educated: Patient Education method: Explanation, Demonstration, Tactile cues, Verbal cues, and Handouts Education comprehension: verbalized understanding and returned demonstration  HOME EXERCISE PROGRAM: Access Code: [email or web address] URL: [email or web address] Date: 11/10/2023 Prepared by: Loralyn Freshwater  Exercises - Shoulder extension with resistance - Neutral  - 1 x daily - 7 x weekly - 2 sets - 10 reps - 5 seconds hold  Green band     ASSESSMENT:  CLINICAL IMPRESSION:  Better able to perform cervical rotation R and L with decreased symptoms with B  scapular retraction and posterior tipping position to help decrease B upper trap and scalene muscle tension. Demonstrates R sternocleidomastoid tightness with cervical rotation as well. Good lower and middle trap muscle use reported with shoulder extension exercise. Pt tolerated session well without aggravation of symptoms. Pt will benefit from continued skilled physical therapy services to decrease pain, improve strength and function.        OBJECTIVE IMPAIRMENTS: decreased ROM, decreased strength, improper body mechanics, postural dysfunction, and pain.   ACTIVITY LIMITATIONS: carrying, lifting, and reach over head  PARTICIPATION LIMITATIONS:   PERSONAL FACTORS: Fitness, Profession, Time since onset of injury/illness/exacerbation, and 1 comorbidity: depression  are also affecting patient's functional outcome.  REHAB POTENTIAL: Fair    CLINICAL DECISION MAKING: Stable/uncomplicated  EVALUATION COMPLEXITY: Low   GOALS: Goals reviewed with patient? Yes  SHORT TERM GOALS: Target date: 11/19/2023  Pt will be independent with his initial HEP to decrease pain, improve strength, function, ability to look around as well as lift without neck pain.  Baseline: Pt has not yet started his initial HEP (11/08/2023) Goal status: INITIAL    LONG TERM GOALS: Target date: 01/07/2024  Pt will have a decrease in neck pain to 3/10 or less at worst to promote ability to look around, lift, perform yard work as well as work out at [employer] more comfortably for his neck.  Baseline: 7/10 at most for the past 3 months (11/08/2023) Goal status: INITIAL  2.  Pt will improve B lower, middle, ER, IR strength by at least 1/2 MMT grade to promote ability to look around, and lift with less neck pain.  Baseline:  MMT Right eval Left eval  Shoulder internal rotation 4- 4-  Shoulder external rotation 4 with R lateral shoulder pain 4-  Middle trapezius 4- (with R lateral arm pain) 4-  Lower trapezius 4- (with R  lateral arm pain) 3+   Goal status: INITIAL  3.  Pt will have a decrease in Neck Disability Index (NDI) score by at least 7 points as a demonstration of improved function.  Baseline: NDI score of 17 suggesting moderate disability (11/08/2023) Goal status: INITIAL     PLAN:  PT FREQUENCY: 1-2x/week  PT DURATION: 8 weeks  PLANNED INTERVENTIONS: 97110-Therapeutic exercises, 97530- Therapeutic activity, 97112- Neuromuscular re-education, 97535- Self Care, 24401- Manual therapy, 97014- Electrical stimulation (unattended), and 97033- Ionotophoresis 4mg /ml Dexamethasone  PLAN FOR NEXT SESSION: posture, anterior cervical, lower trap strengthening, manual techniques, modalities PRN   Jonanthony Nahar, PT, DPT 11/10/2023, 12:28 PM

## 2023-11-17 ENCOUNTER — Ambulatory Visit: Payer: BC Managed Care – PPO | Attending: Nurse Practitioner

## 2023-11-17 ENCOUNTER — Ambulatory Visit
Admission: RE | Admit: 2023-11-17 | Discharge: 2023-11-17 | Disposition: A | Discharge status: Home / Self Care | Payer: BC Managed Care – PPO | Source: Ambulatory Visit | Attending: Nurse Practitioner | Admitting: Nurse Practitioner

## 2023-11-17 DIAGNOSIS — M542 Cervicalgia: Secondary | ICD-10-CM | POA: Insufficient documentation

## 2023-11-17 DIAGNOSIS — R519 Headache, unspecified: Secondary | ICD-10-CM | POA: Diagnosis not present

## 2023-11-17 DIAGNOSIS — G43119 Migraine with aura, intractable, without status migrainosus: Secondary | ICD-10-CM | POA: Insufficient documentation

## 2023-11-17 DIAGNOSIS — M4722 Other spondylosis with radiculopathy, cervical region: Secondary | ICD-10-CM | POA: Diagnosis not present

## 2023-11-17 MED ORDER — IOHEXOL 350 MG/ML SOLN
75.0000 mL | Freq: Once | INTRAVENOUS | Status: AC | PRN
  Administered 2023-11-17: 75 mL via INTRAVENOUS

## 2023-11-17 NOTE — Therapy (Signed)
 OUTPATIENT PHYSICAL THERAPY TREATMENT  Patient Name: Jesus Gardner MRN: [id number] DOB:[birth year], 33 y.o., male Today's Date: 11/17/2023  END OF SESSION:  PT End of Session - 11/17/23 0814     Visit Number 3    Number of Visits 17    Date for PT Re-Evaluation 01/07/24    PT Start Time 0815    PT Stop Time 0859    PT Time Calculation (min) 44 min    Activity Tolerance Patient tolerated treatment well    Behavior During Therapy [facility] for tasks assessed/performed               Past Medical History:  Diagnosis Date   Allergy    Costochondritis 12/30/2015   Depression    Elevated transaminase level 03/12/2016   GERD (gastroesophageal reflux disease)    History of chicken pox    Past Surgical History:  Procedure Laterality Date   COLONOSCOPY WITH PROPOFOL  N/A 08/20/2020   Procedure: COLONOSCOPY WITH PROPOFOL ;  Surgeon: Jinny Carmine, MD;  Location: [facility] ENDOSCOPY;  Service: Endoscopy;  Laterality: N/A;   None     Patient Active Problem List   Diagnosis Date Noted   Neck pain 09/07/2023   Intractable migraine with aura without status migrainosus 01/17/2023   BMI 40.0-44.9, adult (HCC) 01/17/2023   Diverticulosis 03/21/2019   OSA (obstructive sleep apnea) 12/06/2018   Allergic rhinitis 12/30/2015   Motion sickness 12/30/2015   Obesity 12/30/2015   Hyperlipidemia, mixed 03/06/2009   Fam hx-ischem heart disease 10/12/1998    PCP: Valerio Melanie DASEN, NP  REFERRING PROVIDER: Valerio Melanie DASEN, NP  REFERRING DIAG: M54.2 (ICD-10-CM) - Neck pain  THERAPY DIAG:  Cervicalgia  Rationale for Evaluation and Treatment: Rehabilitation  ONSET DATE: 2020  SUBJECTIVE:                                                                                                                                                                                                         SUBJECTIVE STATEMENT: Neck is doing ok today. Was doing his HEP, had his first flare up Saturday and got a stiff  neck and a headache. Was better Sunday. B upper trap soreness and stiffness currently.     Hand dominance: Right  PERTINENT HISTORY:  Neck pain. Pain began around 2020. Had L upper trap and L lateral neck pain. At one time around 2020, pt woke up one day with L UE and LE pain. Went to the ER, pt did not have a CVA, or a hear attack. Pt was given muscle relaxer which did not  help. Could not hold things with L hand and pt would drop things. Had massage therapy L upper trap area which helped. Currently has neck and B upper trap and B medial shoulder blade pain (scalene muscle referral pattern). Gets B posterior neck muscle tightness which causes cervicogenic headache.    No latex allergies  PAIN:  Are you having pain? Yes: NPRS scale: 3/10 Pain location: Neck (posterior and B upper trap and B medial scapular area) Pain description: tight, sharp at times, constant dull ache Aggravating factors: heat, turning his head, looking up, extraneous activity such as yard work, lifting weights  Relieving factors: looking down, sitting with his hands supporting his head, sleep, massage, ice  PRECAUTIONS: No known precautions  RED FLAGS: Bowel or bladder incontinence: No and Cauda equina syndrome: No     WEIGHT BEARING RESTRICTIONS: No  FALLS:  Has patient fallen in last 6 months? 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   PLOF: Independent  PATIENT GOALS: pain relief  NEXT MD VISIT: 04/12/2024  OBJECTIVE:  Note: Objective measures were completed at Evaluation unless otherwise noted.  DIAGNOSTIC FINDINGS:  DG Cervical Spine Complete 09/30/2024 Narrative & Impression  CLINICAL DATA:  Chronic neck pain   EXAM: CERVICAL SPINE - COMPLETE 4+ VIEW   COMPARISON:  03/29/2019   FINDINGS: There is no evidence of cervical spine fracture or prevertebral soft tissue swelling. Alignment is normal. No other significant bone abnormalities are identified.    IMPRESSION: Negative cervical spine radiographs.     Electronically Signed   By: CHRISTELLA.  Shick M.D.   On: 10/10/2023 15:50            PATIENT SURVEYS:  NDI 17 (11/08/2023)  COGNITION: Overall cognitive status: Within functional limits for tasks assessed  SENSATION: WFL  POSTURE: cervical protraction, movement preference around C5-C6 area, B protracted shoulders, L lateral shift.  PALPATION: Cervical paraspinal muscle tension, B upper trap muscle tension R > L, decreased caudal glide R and L first rib  Decreased CPA mid to upper thoracic spine with reproduction of symptoims.     CERVICAL ROM:   Active ROM A/PROM (deg) eval  Flexion Full with B cervical paraspinal muscle pulling  Extension Limited with L medial scapular pulling and pain, and cervical spine pain.   Right lateral flexion WFL with L lateral neck tightness  Left lateral flexion WFL with R lateral neck tightness  Right rotation 65 with neck tightness  Left rotation 73 with neck tightness   (Blank rows = not tested)  UPPER EXTREMITY ROM:  Active ROM Right eval Left eval  Shoulder flexion Limited with medial scapular pain Limited with medial scapular pain  Shoulder extension    Shoulder abduction    Shoulder adduction    Shoulder extension    Shoulder internal rotation    Shoulder external rotation    Elbow flexion    Elbow extension    Wrist flexion    Wrist extension    Wrist ulnar deviation    Wrist radial deviation    Wrist pronation    Wrist supination     (Blank rows = not tested)  UPPER EXTREMITY MMT:  MMT Right eval Left eval  Shoulder flexion 4 4  Shoulder extension    Shoulder abduction 4 (with R lateral shoulder and arm pain) 4- with L shoulder pull  Shoulder adduction    Shoulder extension    Shoulder internal rotation 4- 4-  Shoulder external rotation 4 with R  lateral shoulder pain 4-  Middle trapezius 4- (with R lateral arm pain) 4-  Lower trapezius 4- (with R lateral  arm pain) 3+  Elbow flexion    Elbow extension    Wrist flexion    Wrist extension    Wrist ulnar deviation    Wrist radial deviation    Wrist pronation    Wrist supination    Grip strength     (Blank rows = not tested)  CERVICAL SPECIAL TESTS:   VBI testing: positive R side with pt reports of tunnel vision  Negative for L side. Pt was recommended to contact his PCP. Pt verbalized understanding.    FUNCTIONAL TESTS:    TREATMENT DATE: 11/17/2023                                                                                                                               Therapeutic exercise  Supine cervical nod 10x5 seconds for 3 sets   Then with R and L cervical rotation 10x3 each direction  Supine B scapular retraction 10x5 seconds for 3 sets  Supine B shoulder horizontal abduction 10x5 seconds for 2 sets to promote pectoralis muscle stretch, as well as upper thoracic extension to help decrease lower cervical stress.  Seated manually resisted scapular retraction targeting the lower trap muscles to help decrease upper trap muscle tension   L 10x5 seconds for 2 sets  R 10x5 seconds for 2 sets   Improved exercise technique, movement at target joints, use of target muscles after mod verbal, visual, tactile cues.   Manual therapy  Supine STM R cervical paraspinal muscle > L to decrease tension      PATIENT EDUCATION:  Education details: There-ex, HEP Person educated: Patient Education method: Explanation, Demonstration, Tactile cues, Verbal cues, and Handouts Education comprehension: verbalized understanding and returned demonstration  HOME EXERCISE PROGRAM: Access Code: [email or web address] URL: [email or web address] Date: 11/10/2023 Prepared by: Emil Glassman  Exercises - Shoulder extension with resistance - Neutral  - 1 x daily - 7 x weekly - 2 sets - 10 reps - 5 seconds hold  Green band   Discontinued on 11/17/2023   Access Code: [email or web address] URL:  [email or web address] Date: 11/17/2023 Prepared by: Emil Glassman  Exercises - Supine Head Nod Deep Neck Flexor Training  - 3 x daily - 7 x weekly - 3 sets - 10 reps - 5 seconds hold  - Supine Scapular Retraction  - 1 x daily - 7 x weekly - 3 sets - 10 reps - 5 seconds hold - Supine Shoulder Horizontal Abduction with Dumbbells  - 3 x daily - 7 x weekly - 3 sets - 10 reps - 5 seconds hold     ASSESSMENT:  CLINICAL IMPRESSION: Discontinued B shoulder extension with resistance home exercise. Focused more on anterior cervical muscle activation as well as lower trap strengthening to help decrease cervical paraspinal and B upper trap muscle tension. Decreased R upper trap muscle tension reported  after session. Pt will benefit from continued skilled physical therapy services to decrease pain, improve strength and function.        OBJECTIVE IMPAIRMENTS: decreased ROM, decreased strength, improper body mechanics, postural dysfunction, and pain.   ACTIVITY LIMITATIONS: carrying, lifting, and reach over head  PARTICIPATION LIMITATIONS:   PERSONAL FACTORS: Fitness, Profession, Time since onset of injury/illness/exacerbation, and 1 comorbidity: depression  are also affecting patient's functional outcome.   REHAB POTENTIAL: Fair    CLINICAL DECISION MAKING: Stable/uncomplicated  EVALUATION COMPLEXITY: Low   GOALS: Goals reviewed with patient? Yes  SHORT TERM GOALS: Target date: 11/19/2023  Pt will be independent with his initial HEP to decrease pain, improve strength, function, ability to look around as well as lift without neck pain.  Baseline: Pt has not yet started his initial HEP (11/08/2023) Goal status: INITIAL    LONG TERM GOALS: Target date: 01/07/2024  Pt will have a decrease in neck pain to 3/10 or less at worst to promote ability to look around, lift, perform yard work as well as work out at [employer] more comfortably for his neck.  Baseline: 7/10 at most for  the past 3 months (11/08/2023) Goal status: INITIAL  2.  Pt will improve B lower, middle, ER, IR strength by at least 1/2 MMT grade to promote ability to look around, and lift with less neck pain.  Baseline:  MMT Right eval Left eval  Shoulder internal rotation 4- 4-  Shoulder external rotation 4 with R lateral shoulder pain 4-  Middle trapezius 4- (with R lateral arm pain) 4-  Lower trapezius 4- (with R lateral arm pain) 3+   Goal status: INITIAL  3.  Pt will have a decrease in Neck Disability Index (NDI) score by at least 7 points as a demonstration of improved function.  Baseline: NDI score of 17 suggesting moderate disability (11/08/2023) Goal status: INITIAL     PLAN:  PT FREQUENCY: 1-2x/week  PT DURATION: 8 weeks  PLANNED INTERVENTIONS: 97110-Therapeutic exercises, 97530- Therapeutic activity, 97112- Neuromuscular re-education, 97535- Self Care, 02859- Manual therapy, 97014- Electrical stimulation (unattended), and 97033- Ionotophoresis 4mg /ml Dexamethasone  PLAN FOR NEXT SESSION: posture, anterior cervical, lower trap strengthening, manual techniques, modalities PRN   Hellon Vaccarella, PT, DPT 11/17/2023, 4:35 PM

## 2023-11-22 ENCOUNTER — Ambulatory Visit: Payer: BC Managed Care – PPO

## 2023-11-22 ENCOUNTER — Telehealth: Payer: Self-pay | Admitting: Nurse Practitioner

## 2023-11-22 NOTE — Telephone Encounter (Signed)
 I am not seeing he is due but if he needs happy to call and schedule for nurse visit if recommend.

## 2023-11-22 NOTE — Telephone Encounter (Signed)
 Discussed with patient. No further action.

## 2023-11-22 NOTE — Telephone Encounter (Signed)
 Copied from CRM [id number]. Topic: General - Other >> Nov 19, 2023  5:18 PM Ja-Kwan M wrote: Reason for CRM: Pt stated that his wife is pregnant and he was told to get a Tdap shot. Pt requests to schedule an appt to get Tdap. Cb# [phone]

## 2023-11-24 ENCOUNTER — Ambulatory Visit: Payer: BC Managed Care – PPO

## 2023-11-24 DIAGNOSIS — M542 Cervicalgia: Secondary | ICD-10-CM | POA: Diagnosis not present

## 2023-11-24 NOTE — Therapy (Signed)
OUTPATIENT PHYSICAL THERAPY TREATMENT  Patient Name: Jesus Gardner MRN: [id number] DOB:October 09, 1991, 33 y.o., male Today's Date: 11/24/2023  END OF SESSION:  PT End of Session - 11/24/23 1035     Visit Number 4    Number of Visits 17    Date for PT Re-Evaluation 01/07/24    PT Start Time 1035    PT Stop Time 1116    PT Time Calculation (min) 41 min    Activity Tolerance Patient tolerated treatment well    Behavior During Therapy [facility] for tasks assessed/performed                Past Medical History:  Diagnosis Date   Allergy    Costochondritis 12/30/2015   Depression    Elevated transaminase level 03/12/2016   GERD (gastroesophageal reflux disease)    History of chicken pox    Past Surgical History:  Procedure Laterality Date   COLONOSCOPY WITH PROPOFOL N/A 08/20/2020   Procedure: COLONOSCOPY WITH PROPOFOL;  Surgeon: Midge Minium, MD;  Location: [facility] ENDOSCOPY;  Service: Endoscopy;  Laterality: N/A;   None     Patient Active Problem List   Diagnosis Date Noted   Neck pain 09/07/2023   Intractable migraine with aura without status migrainosus 01/17/2023   BMI 40.0-44.9, adult (HCC) 01/17/2023   Diverticulosis 03/21/2019   OSA (obstructive sleep apnea) 12/06/2018   Allergic rhinitis 12/30/2015   Motion sickness 12/30/2015   Obesity 12/30/2015   Hyperlipidemia, mixed 03/06/2009   Fam hx-ischem heart disease 10/12/1998    PCP: Marjie Skiff, NP  REFERRING PROVIDER: Marjie Skiff, NP  REFERRING DIAG: M54.2 (ICD-10-CM) - Neck pain  THERAPY DIAG:  Cervicalgia  Rationale for Evaluation and Treatment: Rehabilitation  ONSET DATE: 2020  SUBJECTIVE:                                                                                                                                                                                                         SUBJECTIVE STATEMENT: A closet organizer fell onto his L foot the other day and hurt his foot.  Neck was  doing pretty good over the weekend, did not have head aches. A little sore today. 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  2/10 currently.      Hand dominance: Right  PERTINENT HISTORY:  Neck pain. Pain began around 2020. Had L upper trap and L lateral neck pain. At one time around 2020, pt woke up one day with L UE and LE pain. Went to the ER,  pt did not have a CVA, or a hear attack. Pt was given muscle relaxer which did not help. Could not hold things with L hand and pt would drop things. Had massage therapy L upper trap area which helped. Currently has neck and B upper trap and B medial shoulder blade pain (scalene muscle referral pattern). Gets B posterior neck muscle tightness which causes cervicogenic headache.    No latex allergies  PAIN:  Are you having pain? Yes: NPRS scale: 3/10 Pain location: Neck (posterior and B upper trap and B medial scapular area) Pain description: tight, sharp at times, constant dull ache Aggravating factors: heat, turning his head, looking up, extraneous activity such as yard work, lifting weights  Relieving factors: looking down, sitting with his hands supporting his head, sleep, massage, ice  PRECAUTIONS: No known precautions  RED FLAGS: Bowel or bladder incontinence: No and Cauda equina syndrome: No     WEIGHT BEARING RESTRICTIONS: No  FALLS:  Has patient fallen in last 6 months? 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   PLOF: Independent  PATIENT GOALS: pain relief  NEXT MD VISIT: 04/12/2024  OBJECTIVE:  Note: Objective measures were completed at Evaluation unless otherwise noted.  DIAGNOSTIC FINDINGS:  DG Cervical Spine Complete 09/30/2024 Narrative & Impression  CLINICAL DATA:  Chronic neck pain   EXAM: CERVICAL SPINE - COMPLETE 4+ VIEW   COMPARISON:  03/29/2019   FINDINGS: There is no evidence of cervical spine fracture or prevertebral soft tissue swelling.  Alignment is normal. No other significant bone abnormalities are identified.   IMPRESSION: Negative cervical spine radiographs.     Electronically Signed   By: Judie Petit.  Shick M.D.   On: 10/10/2023 15:50            PATIENT SURVEYS:  NDI 17 (11/08/2023)  COGNITION: Overall cognitive status: Within functional limits for tasks assessed  SENSATION: WFL  POSTURE: cervical protraction, movement preference around C5-C6 area, B protracted shoulders, L lateral shift.  PALPATION: Cervical paraspinal muscle tension, B upper trap muscle tension R > L, decreased caudal glide R and L first rib  Decreased CPA mid to upper thoracic spine with reproduction of symptoims.     CERVICAL ROM:   Active ROM A/PROM (deg) eval  Flexion Full with B cervical paraspinal muscle pulling  Extension Limited with L medial scapular pulling and pain, and cervical spine pain.   Right lateral flexion WFL with L lateral neck tightness  Left lateral flexion WFL with R lateral neck tightness  Right rotation 65 with neck tightness  Left rotation 73 with neck tightness   (Blank rows = not tested)  UPPER EXTREMITY ROM:  Active ROM Right eval Left eval  Shoulder flexion Limited with medial scapular pain Limited with medial scapular pain  Shoulder extension    Shoulder abduction    Shoulder adduction    Shoulder extension    Shoulder internal rotation    Shoulder external rotation    Elbow flexion    Elbow extension    Wrist flexion    Wrist extension    Wrist ulnar deviation    Wrist radial deviation    Wrist pronation    Wrist supination     (Blank rows = not tested)  UPPER EXTREMITY MMT:  MMT Right eval Left eval  Shoulder flexion 4 4  Shoulder extension    Shoulder abduction 4 (with R lateral shoulder and arm pain) 4- with L shoulder pull  Shoulder adduction  Shoulder extension    Shoulder internal rotation 4- 4-  Shoulder external rotation 4 with R lateral shoulder pain 4-   Middle trapezius 4- (with R lateral arm pain) 4-  Lower trapezius 4- (with R lateral arm pain) 3+  Elbow flexion    Elbow extension    Wrist flexion    Wrist extension    Wrist ulnar deviation    Wrist radial deviation    Wrist pronation    Wrist supination    Grip strength     (Blank rows = not tested)  CERVICAL SPECIAL TESTS:   VBI testing: positive R side with pt reports of tunnel vision  Negative for L side. Pt was recommended to contact his PCP. Pt verbalized understanding.    FUNCTIONAL TESTS:    TREATMENT DATE: 11/24/2023                                                                                                                               Therapeutic exercise  Supine cervical nod 10x5 seconds for 2 sets   Then with R and L cervical rotation 10x each direction   Then with head on occipital float 10x2   Deep cervical mini flexion 5x with PT assist   R medial scapular discomfort  Seated B scapular depression isometrics 6x5 seconds   R medial scapular pull  Seated manually resisted scapular retraction isometrics targeting the lower trap to decrease B upper trap muscle overuse  R 10x5 seconds for 2 sets  L 10x5 seconds      Improved exercise technique, movement at target joints, use of target muscles after mod verbal, visual, tactile cues.   Manual therapy Supine STM R cervical paraspinal muscle > L to decrease tension      PATIENT EDUCATION:  Education details: There-ex, HEP Person educated: Patient Education method: Explanation, Demonstration, Tactile cues, Verbal cues, and Handouts Education comprehension: verbalized understanding and returned demonstration  HOME EXERCISE PROGRAM: Access Code: [email or web address] URL: [email or web address] Date: 11/10/2023 Prepared by: Loralyn Freshwater  Exercises - Shoulder extension with resistance - Neutral  - 1 x daily - 7 x weekly - 2 sets - 10 reps - 5 seconds hold  Green band   Discontinued on  11/17/2023   Access Code: [email or web address] URL: [email or web address] Date: 11/17/2023 Prepared by: Loralyn Freshwater  Exercises - Supine Head Nod Deep Neck Flexor Training  - 3 x daily - 7 x weekly - 3 sets - 10 reps - 5 seconds hold  - Supine Scapular Retraction  - 1 x daily - 7 x weekly - 3 sets - 10 reps - 5 seconds hold - Supine Shoulder Horizontal Abduction with Dumbbells  - 3 x daily - 7 x weekly - 3 sets - 10 reps - 5 seconds hold     ASSESSMENT:  CLINICAL IMPRESSION:  Good response from previous session based on pt reports. Continued working on decreasing B cervical paraspinal and upper trap muscle tension to  his neck as well as improving anterior cervical and lower trap muscle activation and strength to improve cervical and scapular mechanics to decrease neck and B upper trap pain. Pt tolerated session well without aggravation of neck and B upper trap pain.  Pt will benefit from continued skilled physical therapy services to decrease pain, improve strength and function.        OBJECTIVE IMPAIRMENTS: decreased ROM, decreased strength, improper body mechanics, postural dysfunction, and pain.   ACTIVITY LIMITATIONS: carrying, lifting, and reach over head  PARTICIPATION LIMITATIONS:   PERSONAL FACTORS: Fitness, Profession, Time since onset of injury/illness/exacerbation, and 1 comorbidity: depression  are also affecting patient's functional outcome.   REHAB POTENTIAL: Fair    CLINICAL DECISION MAKING: Stable/uncomplicated  EVALUATION COMPLEXITY: Low   GOALS: Goals reviewed with patient? Yes  SHORT TERM GOALS: Target date: 11/19/2023  Pt will be independent with his initial HEP to decrease pain, improve strength, function, ability to look around as well as lift without neck pain.  Baseline: Pt has not yet started his initial HEP (11/08/2023) Goal status: INITIAL    LONG TERM GOALS: Target date: 01/07/2024  Pt will have a decrease in neck pain to 3/10 or less at  worst to promote ability to look around, lift, perform yard work as well as work out at [employer] more comfortably for his neck.  Baseline: 7/10 at most for the past 3 months (11/08/2023) Goal status: INITIAL  2.  Pt will improve B lower, middle, ER, IR strength by at least 1/2 MMT grade to promote ability to look around, and lift with less neck pain.  Baseline:  MMT Right eval Left eval  Shoulder internal rotation 4- 4-  Shoulder external rotation 4 with R lateral shoulder pain 4-  Middle trapezius 4- (with R lateral arm pain) 4-  Lower trapezius 4- (with R lateral arm pain) 3+   Goal status: INITIAL  3.  Pt will have a decrease in Neck Disability Index (NDI) score by at least 7 points as a demonstration of improved function.  Baseline: NDI score of 17 suggesting moderate disability (11/08/2023) Goal status: INITIAL     PLAN:  PT FREQUENCY: 1-2x/week  PT DURATION: 8 weeks  PLANNED INTERVENTIONS: 97110-Therapeutic exercises, 97530- Therapeutic activity, 97112- Neuromuscular re-education, 97535- Self Care, 53664- Manual therapy, 97014- Electrical stimulation (unattended), and 97033- Ionotophoresis 4mg /ml Dexamethasone  PLAN FOR NEXT SESSION: posture, anterior cervical, lower trap strengthening, manual techniques, modalities PRN   Mikiah Durall, PT, DPT 11/24/2023, 3:00 PM

## 2023-11-29 ENCOUNTER — Ambulatory Visit: Payer: BC Managed Care – PPO

## 2023-11-29 DIAGNOSIS — M542 Cervicalgia: Secondary | ICD-10-CM

## 2023-11-29 NOTE — Therapy (Signed)
 OUTPATIENT PHYSICAL THERAPY TREATMENT  Patient Name: Jesus Gardner MRN: [id number] DOB:1991-05-04, 33 y.o., male Today's Date: 11/29/2023  END OF SESSION:  PT End of Session - 11/29/23 1034     Visit Number 5    Number of Visits 17    Date for PT Re-Evaluation 01/07/24    PT Start Time 1034    PT Stop Time 1116    PT Time Calculation (min) 42 min    Activity Tolerance Patient tolerated treatment well    Behavior During Therapy [facility] for tasks assessed/performed                 Past Medical History:  Diagnosis Date   Allergy    Costochondritis 12/30/2015   Depression    Elevated transaminase level 03/12/2016   GERD (gastroesophageal reflux disease)    History of chicken pox    Past Surgical History:  Procedure Laterality Date   COLONOSCOPY WITH PROPOFOL N/A 08/20/2020   Procedure: COLONOSCOPY WITH PROPOFOL;  Surgeon: Midge Minium, MD;  Location: [facility] ENDOSCOPY;  Service: Endoscopy;  Laterality: N/A;   None     Patient Active Problem List   Diagnosis Date Noted   Neck pain 09/07/2023   Intractable migraine with aura without status migrainosus 01/17/2023   BMI 40.0-44.9, adult (HCC) 01/17/2023   Diverticulosis 03/21/2019   OSA (obstructive sleep apnea) 12/06/2018   Allergic rhinitis 12/30/2015   Motion sickness 12/30/2015   Obesity 12/30/2015   Hyperlipidemia, mixed 03/06/2009   Fam hx-ischem heart disease 10/12/1998    PCP: Marjie Skiff, NP  REFERRING PROVIDER: Marjie Skiff, NP  REFERRING DIAG: M54.2 (ICD-10-CM) - Neck pain  THERAPY DIAG:  Cervicalgia  Rationale for Evaluation and Treatment: Rehabilitation  ONSET DATE: 2020  SUBJECTIVE:                                                                                                                                                                                                         SUBJECTIVE STATEMENT:  Neck has been doing ok. Had some stiffness that started around Friday but was able  to get is under control with stretches and muscle relaxers. 2-3/10 usual neck pain currently.     Hand dominance: Right  PERTINENT HISTORY:  Neck pain. Pain began around 2020. Had L upper trap and L lateral neck pain. At one time around 2020, pt woke up one day with L UE and LE pain. Went to the ER, pt did not have a CVA, or a hear attack. Pt was given muscle relaxer which did not  help. Could not hold things with L hand and pt would drop things. Had massage therapy L upper trap area which helped. Currently has neck and B upper trap and B medial shoulder blade pain (scalene muscle referral pattern). Gets B posterior neck muscle tightness which causes cervicogenic headache.    No latex allergies  PAIN:  Are you having pain? Yes: NPRS scale: 3/10 Pain location: Neck (posterior and B upper trap and B medial scapular area) Pain description: tight, sharp at times, constant dull ache Aggravating factors: heat, turning his head, looking up, extraneous activity such as yard work, lifting weights  Relieving factors: looking down, sitting with his hands supporting his head, sleep, massage, ice  PRECAUTIONS: No known precautions  RED FLAGS: Bowel or bladder incontinence: No and Cauda equina syndrome: No     WEIGHT BEARING RESTRICTIONS: No  FALLS:  Has patient fallen in last 6 months? 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   PLOF: Independent  PATIENT GOALS: pain relief  NEXT MD VISIT: 04/12/2024  OBJECTIVE:  Note: Objective measures were completed at Evaluation unless otherwise noted.  DIAGNOSTIC FINDINGS:  DG Cervical Spine Complete 09/30/2024 Narrative & Impression  CLINICAL DATA:  Chronic neck pain   EXAM: CERVICAL SPINE - COMPLETE 4+ VIEW   COMPARISON:  03/29/2019   FINDINGS: There is no evidence of cervical spine fracture or prevertebral soft tissue swelling. Alignment is normal. No other significant bone abnormalities are identified.    IMPRESSION: Negative cervical spine radiographs.     Electronically Signed   By: Judie Petit.  Shick M.D.   On: 10/10/2023 15:50            PATIENT SURVEYS:  NDI 17 (11/08/2023)  COGNITION: Overall cognitive status: Within functional limits for tasks assessed  SENSATION: WFL  POSTURE: cervical protraction, movement preference around C5-C6 area, B protracted shoulders, L lateral shift.  PALPATION: Cervical paraspinal muscle tension, B upper trap muscle tension R > L, decreased caudal glide R and L first rib  Decreased CPA mid to upper thoracic spine with reproduction of symptoims.     CERVICAL ROM:   Active ROM A/PROM (deg) eval  Flexion Full with B cervical paraspinal muscle pulling  Extension Limited with L medial scapular pulling and pain, and cervical spine pain.   Right lateral flexion WFL with L lateral neck tightness  Left lateral flexion WFL with R lateral neck tightness  Right rotation 65 with neck tightness  Left rotation 73 with neck tightness   (Blank rows = not tested)  UPPER EXTREMITY ROM:  Active ROM Right eval Left eval  Shoulder flexion Limited with medial scapular pain Limited with medial scapular pain  Shoulder extension    Shoulder abduction    Shoulder adduction    Shoulder extension    Shoulder internal rotation    Shoulder external rotation    Elbow flexion    Elbow extension    Wrist flexion    Wrist extension    Wrist ulnar deviation    Wrist radial deviation    Wrist pronation    Wrist supination     (Blank rows = not tested)  UPPER EXTREMITY MMT:  MMT Right eval Left eval  Shoulder flexion 4 4  Shoulder extension    Shoulder abduction 4 (with R lateral shoulder and arm pain) 4- with L shoulder pull  Shoulder adduction    Shoulder extension    Shoulder internal rotation 4- 4-  Shoulder external rotation 4 with R  lateral shoulder pain 4-  Middle trapezius 4- (with R lateral arm pain) 4-  Lower trapezius 4- (with R lateral  arm pain) 3+  Elbow flexion    Elbow extension    Wrist flexion    Wrist extension    Wrist ulnar deviation    Wrist radial deviation    Wrist pronation    Wrist supination    Grip strength     (Blank rows = not tested)  CERVICAL SPECIAL TESTS:   VBI testing: positive R side with pt reports of tunnel vision  Negative for L side. Pt was recommended to contact his PCP. Pt verbalized understanding.    FUNCTIONAL TESTS:    TREATMENT DATE: 11/29/2023                                                                                                                               Therapeutic exercise  With head on occipital float  Supine cervical nod 10x10 seconds for 2 sets  Deep cervical mini flexion 5x3 with PT assist   R medial scapular symptoms today    R and L cervical rotation with head on occipital float 10x3 each direction    B scapular retraction 10x3 with 5 second holds  Seated manually resisted scapular retraction isometrics targeting the lower trap to decrease B upper trap muscle overuse  R 10x5 seconds for 2 sets  L 10x5 seconds, then 3x5 seconds    L posterior lateral neck light 2 finger pressure    Chin tucks 10x5 seconds  R pectoralis pulling sensation around the R scalene muscle referral distribution, eased with less of a chin tuck movement.    Improved exercise technique, movement at target joints, use of target muscles after mod verbal, visual, tactile cues.   Manual therapy   Supine STM R cervical paraspinal muscle > L to decrease tension  Seated B Upper trap STM to decrease tension R > L    PATIENT EDUCATION:  Education details: There-ex, HEP Person educated: Patient Education method: Explanation, Demonstration, Tactile cues, Verbal cues, and Handouts Education comprehension: verbalized understanding and returned demonstration  HOME EXERCISE PROGRAM: Access Code: [email or web address] URL: [email or web address] Date: 11/10/2023 Prepared by:  Loralyn Freshwater  Exercises - Shoulder extension with resistance - Neutral  - 1 x daily - 7 x weekly - 2 sets - 10 reps - 5 seconds hold  Green band   Discontinued on 11/17/2023   Access Code: [email or web address] URL: [email or web address] Date: 11/17/2023 Prepared by: Loralyn Freshwater  Exercises - Supine Head Nod Deep Neck Flexor Training  - 3 x daily - 7 x weekly - 3 sets - 10 reps - 5 seconds hold  - Supine Scapular Retraction  - 1 x daily - 7 x weekly - 3 sets - 10 reps - 5 seconds hold - Supine Shoulder Horizontal Abduction with Dumbbells  - 3 x daily - 7 x weekly - 3 sets - 10 reps - 5 seconds  hold     ASSESSMENT:  CLINICAL IMPRESSION:  Continued working on improving anterior cervical and lower trap muscle strength to help decrease tension to his posterior neck and B upper trap area. Pt also seems to demonstrate R scalene muscle tightness referral symptoms to his R pectoralis and R medial scapular area. Fair tolerance to today's session. Pt will benefit from continued skilled physical therapy services to decrease pain, improve strength and function.        OBJECTIVE IMPAIRMENTS: decreased ROM, decreased strength, improper body mechanics, postural dysfunction, and pain.   ACTIVITY LIMITATIONS: carrying, lifting, and reach over head  PARTICIPATION LIMITATIONS:   PERSONAL FACTORS: Fitness, Profession, Time since onset of injury/illness/exacerbation, and 1 comorbidity: depression  are also affecting patient's functional outcome.   REHAB POTENTIAL: Fair    CLINICAL DECISION MAKING: Stable/uncomplicated  EVALUATION COMPLEXITY: Low   GOALS: Goals reviewed with patient? Yes  SHORT TERM GOALS: Target date: 11/19/2023  Pt will be independent with his initial HEP to decrease pain, improve strength, function, ability to look around as well as lift without neck pain.  Baseline: Pt has not yet started his initial HEP (11/08/2023) Goal status: INITIAL    LONG TERM GOALS:  Target date: 01/07/2024  Pt will have a decrease in neck pain to 3/10 or less at worst to promote ability to look around, lift, perform yard work as well as work out at [employer] more comfortably for his neck.  Baseline: 7/10 at most for the past 3 months (11/08/2023) Goal status: INITIAL  2.  Pt will improve B lower, middle, ER, IR strength by at least 1/2 MMT grade to promote ability to look around, and lift with less neck pain.  Baseline:  MMT Right eval Left eval  Shoulder internal rotation 4- 4-  Shoulder external rotation 4 with R lateral shoulder pain 4-  Middle trapezius 4- (with R lateral arm pain) 4-  Lower trapezius 4- (with R lateral arm pain) 3+   Goal status: INITIAL  3.  Pt will have a decrease in Neck Disability Index (NDI) score by at least 7 points as a demonstration of improved function.  Baseline: NDI score of 17 suggesting moderate disability (11/08/2023) Goal status: INITIAL     PLAN:  PT FREQUENCY: 1-2x/week  PT DURATION: 8 weeks  PLANNED INTERVENTIONS: 97110-Therapeutic exercises, 97530- Therapeutic activity, 97112- Neuromuscular re-education, 97535- Self Care, 16109- Manual therapy, 97014- Electrical stimulation (unattended), and 97033- Ionotophoresis 4mg /ml Dexamethasone  PLAN FOR NEXT SESSION: posture, anterior cervical, lower trap strengthening, manual techniques, modalities PRN   Orhan Mayorga, PT, DPT 11/29/2023, 2:01 PM

## 2023-12-01 ENCOUNTER — Ambulatory Visit: Payer: BC Managed Care – PPO

## 2023-12-01 DIAGNOSIS — M542 Cervicalgia: Secondary | ICD-10-CM | POA: Diagnosis not present

## 2023-12-01 NOTE — Therapy (Signed)
 OUTPATIENT PHYSICAL THERAPY TREATMENT  Patient Name: Jesus Gardner MRN: [id number] DOB:1991/05/14, 33 y.o., male Today's Date: 12/01/2023  END OF SESSION:        Past Medical History:  Diagnosis Date   Allergy    Costochondritis 12/30/2015   Depression    Elevated transaminase level 03/12/2016   GERD (gastroesophageal reflux disease)    History of chicken pox    Past Surgical History:  Procedure Laterality Date   COLONOSCOPY WITH PROPOFOL N/A 08/20/2020   Procedure: COLONOSCOPY WITH PROPOFOL;  Surgeon: Midge Minium, MD;  Location: [facility] ENDOSCOPY;  Service: Endoscopy;  Laterality: N/A;   None     Patient Active Problem List   Diagnosis Date Noted   Neck pain 09/07/2023   Intractable migraine with aura without status migrainosus 01/17/2023   BMI 40.0-44.9, adult (HCC) 01/17/2023   Diverticulosis 03/21/2019   OSA (obstructive sleep apnea) 12/06/2018   Allergic rhinitis 12/30/2015   Motion sickness 12/30/2015   Obesity 12/30/2015   Hyperlipidemia, mixed 03/06/2009   Fam hx-ischem heart disease 10/12/1998    PCP: Marjie Skiff, NP  REFERRING PROVIDER: Marjie Skiff, NP  REFERRING DIAG: M54.2 (ICD-10-CM) - Neck pain  THERAPY DIAG:  Cervicalgia  Rationale for Evaluation and Treatment: Rehabilitation  ONSET DATE: 2020  SUBJECTIVE:                                                                                                                                                                                                         SUBJECTIVE STATEMENT:  Started having R shoulder and neck pain yesterday. Was in the middle of conferences with students. Woke up this morning with catching in his L lower trap muscle. 3/10 L medial scapular pain currently.      Hand dominance: Right  PERTINENT HISTORY:  Neck pain. Pain began around 2020. Had L upper trap and L lateral neck pain. At one time around 2020, pt woke up one day with L UE and LE pain. Went to the ER,  pt did not have a CVA, or a hear attack. Pt was given muscle relaxer which did not help. Could not hold things with L hand and pt would drop things. Had massage therapy L upper trap area which helped. Currently has neck and B upper trap and B medial shoulder blade pain (scalene muscle referral pattern). Gets B posterior neck muscle tightness which causes cervicogenic headache.    No latex allergies  PAIN:  Are you having pain? Yes: NPRS scale: 3/10 Pain location: Neck (posterior and B upper trap and B  medial scapular area) Pain description: tight, sharp at times, constant dull ache Aggravating factors: heat, turning his head, looking up, extraneous activity such as yard work, lifting weights  Relieving factors: looking down, sitting with his hands supporting his head, sleep, massage, ice  PRECAUTIONS: No known precautions  RED FLAGS: Bowel or bladder incontinence: No and Cauda equina syndrome: No     WEIGHT BEARING RESTRICTIONS: No  FALLS:  Has patient fallen in last 6 months? 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   PLOF: Independent  PATIENT GOALS: pain relief  NEXT MD VISIT: 04/12/2024  OBJECTIVE:  Note: Objective measures were completed at Evaluation unless otherwise noted.  DIAGNOSTIC FINDINGS:  DG Cervical Spine Complete 09/30/2024 Narrative & Impression  CLINICAL DATA:  Chronic neck pain   EXAM: CERVICAL SPINE - COMPLETE 4+ VIEW   COMPARISON:  03/29/2019   FINDINGS: There is no evidence of cervical spine fracture or prevertebral soft tissue swelling. Alignment is normal. No other significant bone abnormalities are identified.   IMPRESSION: Negative cervical spine radiographs.     Electronically Signed   By: Judie Petit.  Shick M.D.   On: 10/10/2023 15:50            PATIENT SURVEYS:  NDI 17 (11/08/2023)  COGNITION: Overall cognitive status: Within functional limits for tasks assessed  SENSATION: WFL  POSTURE: cervical  protraction, movement preference around C5-C6 area, B protracted shoulders, L lateral shift.  PALPATION: Cervical paraspinal muscle tension, B upper trap muscle tension R > L, decreased caudal glide R and L first rib  Decreased CPA mid to upper thoracic spine with reproduction of symptoims.     CERVICAL ROM:   Active ROM A/PROM (deg) eval  Flexion Full with B cervical paraspinal muscle pulling  Extension Limited with L medial scapular pulling and pain, and cervical spine pain.   Right lateral flexion WFL with L lateral neck tightness  Left lateral flexion WFL with R lateral neck tightness  Right rotation 65 with neck tightness  Left rotation 73 with neck tightness   (Blank rows = not tested)  UPPER EXTREMITY ROM:  Active ROM Right eval Left eval  Shoulder flexion Limited with medial scapular pain Limited with medial scapular pain  Shoulder extension    Shoulder abduction    Shoulder adduction    Shoulder extension    Shoulder internal rotation    Shoulder external rotation    Elbow flexion    Elbow extension    Wrist flexion    Wrist extension    Wrist ulnar deviation    Wrist radial deviation    Wrist pronation    Wrist supination     (Blank rows = not tested)  UPPER EXTREMITY MMT:  MMT Right eval Left eval  Shoulder flexion 4 4  Shoulder extension    Shoulder abduction 4 (with R lateral shoulder and arm pain) 4- with L shoulder pull  Shoulder adduction    Shoulder extension    Shoulder internal rotation 4- 4-  Shoulder external rotation 4 with R lateral shoulder pain 4-  Middle trapezius 4- (with R lateral arm pain) 4-  Lower trapezius 4- (with R lateral arm pain) 3+  Elbow flexion    Elbow extension    Wrist flexion    Wrist extension    Wrist ulnar deviation    Wrist radial deviation    Wrist pronation    Wrist supination    Grip strength     (Blank rows =  not tested)  CERVICAL SPECIAL TESTS:   VBI testing: positive R side with pt reports of  tunnel vision  Negative for L side. Pt was recommended to contact his PCP. Pt verbalized understanding.    FUNCTIONAL TESTS:    TREATMENT DATE: 12/01/2023                                                                                                                               Therapeutic exercise  First rib stretch with strap  With cervical rotation    L 10x2. L lateral neck catching sensation    R 10x    L lateral neck pull   With contralateral cervical side bend    L 30 seconds x 5   R 30 seconds x 5    Doorway pectoralis stretch   R 30 seconds x 5   L 30 seconds 5   Seated thoracic extension at chair 10x2 with 5 second holds    Improved exercise technique, movement at target joints, use of target muscles after mod verbal, visual, tactile cues.     PATIENT EDUCATION:  Education details: There-ex, HEP Person educated: Patient Education method: Explanation, Demonstration, Tactile cues, Verbal cues, and Handouts Education comprehension: verbalized understanding and returned demonstration  HOME EXERCISE PROGRAM: Access Code: [email or web address] URL: [email or web address] Date: 11/10/2023 Prepared by: Loralyn Freshwater  Exercises - Shoulder extension with resistance - Neutral  - 1 x daily - 7 x weekly - 2 sets - 10 reps - 5 seconds hold  Green band   Discontinued on 11/17/2023   Access Code: [email or web address] URL: [email or web address] Date: 11/17/2023 Prepared by: Loralyn Freshwater  Exercises - Supine Head Nod Deep Neck Flexor Training  - 3 x daily - 7 x weekly - 3 sets - 10 reps - 5 seconds hold  - Supine Scapular Retraction  - 1 x daily - 7 x weekly - 3 sets - 10 reps - 5 seconds hold - Supine Shoulder Horizontal Abduction with Dumbbells  - 3 x daily - 7 x weekly - 3 sets - 10 reps - 5 seconds hold - First Rib Mobilization with Strap  - 3 x daily - 7 x weekly - 1 sets - 5 reps - 30 seconds hold - Seated Thoracic Lumbar Extension  - 1 x daily - 7 x  weekly - 3 sets - 10 reps - 5 seconds hold     ASSESSMENT:  CLINICAL IMPRESSION:  Pt arrived late so session was adjusted accordingly. Worked on decreasing B scalene and upper trap, pectoralis muscle tension and improving thoracic extension to decrease stress to his neck. Pt tolerated session well without aggravation of symptoms. Pt will benefit from continued skilled physical therapy services to decrease pain, improve strength and function.        OBJECTIVE IMPAIRMENTS: decreased ROM, decreased strength, improper body mechanics, postural dysfunction, and pain.   ACTIVITY LIMITATIONS: carrying, lifting, and reach over head  PARTICIPATION LIMITATIONS:  PERSONAL FACTORS: Fitness, Profession, Time since onset of injury/illness/exacerbation, and 1 comorbidity: depression  are also affecting patient's functional outcome.   REHAB POTENTIAL: Fair    CLINICAL DECISION MAKING: Stable/uncomplicated  EVALUATION COMPLEXITY: Low   GOALS: Goals reviewed with patient? Yes  SHORT TERM GOALS: Target date: 11/19/2023  Pt will be independent with his initial HEP to decrease pain, improve strength, function, ability to look around as well as lift without neck pain.  Baseline: Pt has not yet started his initial HEP (11/08/2023) Goal status: INITIAL    LONG TERM GOALS: Target date: 01/07/2024  Pt will have a decrease in neck pain to 3/10 or less at worst to promote ability to look around, lift, perform yard work as well as work out at [employer] more comfortably for his neck.  Baseline: 7/10 at most for the past 3 months (11/08/2023) Goal status: INITIAL  2.  Pt will improve B lower, middle, ER, IR strength by at least 1/2 MMT grade to promote ability to look around, and lift with less neck pain.  Baseline:  MMT Right eval Left eval  Shoulder internal rotation 4- 4-  Shoulder external rotation 4 with R lateral shoulder pain 4-  Middle trapezius 4- (with R lateral arm pain) 4-  Lower  trapezius 4- (with R lateral arm pain) 3+   Goal status: INITIAL  3.  Pt will have a decrease in Neck Disability Index (NDI) score by at least 7 points as a demonstration of improved function.  Baseline: NDI score of 17 suggesting moderate disability (11/08/2023) Goal status: INITIAL     PLAN:  PT FREQUENCY: 1-2x/week  PT DURATION: 8 weeks  PLANNED INTERVENTIONS: 97110-Therapeutic exercises, 97530- Therapeutic activity, 97112- Neuromuscular re-education, 97535- Self Care, 16109- Manual therapy, 97014- Electrical stimulation (unattended), and 97033- Ionotophoresis 4mg /ml Dexamethasone  PLAN FOR NEXT SESSION: posture, anterior cervical, lower trap strengthening, manual techniques, modalities PRN   Anjela Cassara, PT, DPT 12/01/2023, 10:38 AM

## 2023-12-02 ENCOUNTER — Encounter: Payer: Self-pay | Admitting: Nurse Practitioner

## 2023-12-02 NOTE — Progress Notes (Signed)
 Contacted via MyChart   Good news Jesus Gardner, your imaging returned and no occlusions noted.  Great news!!  How is therapy going?

## 2023-12-06 ENCOUNTER — Ambulatory Visit: Payer: BC Managed Care – PPO

## 2023-12-06 DIAGNOSIS — M542 Cervicalgia: Secondary | ICD-10-CM | POA: Diagnosis not present

## 2023-12-06 NOTE — Therapy (Signed)
 OUTPATIENT PHYSICAL THERAPY TREATMENT  Patient Name: Jesus Gardner MRN: [id number] DOB:December 20, 1990, 33 y.o., male Today's Date: 12/06/2023  END OF SESSION:  PT End of Session - 12/06/23 0828     Visit Number 7    Number of Visits 17    Date for PT Re-Evaluation 01/07/24    PT Start Time 0827    PT Stop Time 0900    PT Time Calculation (min) 33 min    Activity Tolerance Patient tolerated treatment well    Behavior During Therapy [facility] for tasks assessed/performed                  Past Medical History:  Diagnosis Date   Allergy    Costochondritis 12/30/2015   Depression    Elevated transaminase level 03/12/2016   GERD (gastroesophageal reflux disease)    History of chicken pox    Past Surgical History:  Procedure Laterality Date   COLONOSCOPY WITH PROPOFOL N/A 08/20/2020   Procedure: COLONOSCOPY WITH PROPOFOL;  Surgeon: Midge Minium, MD;  Location: [facility] ENDOSCOPY;  Service: Endoscopy;  Laterality: N/A;   None     Patient Active Problem List   Diagnosis Date Noted   Neck pain 09/07/2023   Intractable migraine with aura without status migrainosus 01/17/2023   BMI 40.0-44.9, adult (HCC) 01/17/2023   Diverticulosis 03/21/2019   OSA (obstructive sleep apnea) 12/06/2018   Allergic rhinitis 12/30/2015   Motion sickness 12/30/2015   Obesity 12/30/2015   Hyperlipidemia, mixed 03/06/2009   Fam hx-ischem heart disease 10/12/1998    PCP: Marjie Skiff, NP  REFERRING PROVIDER: Marjie Skiff, NP  REFERRING DIAG: M54.2 (ICD-10-CM) - Neck pain  THERAPY DIAG:  Cervicalgia  Rationale for Evaluation and Treatment: Rehabilitation  ONSET DATE: 2020  SUBJECTIVE:                                                                                                                                                                                                         SUBJECTIVE STATEMENT:   Pt reports that over the weekend he started to feel the spasming in the neck  again and feels as though he is right where he was at the beginning of therapy.      Hand dominance: Right  PERTINENT HISTORY:  Neck pain. Pain began around 2020. Had L upper trap and L lateral neck pain. At one time around 2020, pt woke up one day with L UE and LE pain. Went to the ER, pt did not have a CVA, or a hear attack. Pt was given muscle  relaxer which did not help. Could not hold things with L hand and pt would drop things. Had massage therapy L upper trap area which helped. Currently has neck and B upper trap and B medial shoulder blade pain (scalene muscle referral pattern). Gets B posterior neck muscle tightness which causes cervicogenic headache.    No latex allergies  PAIN:  Are you having pain? Yes: NPRS scale: /10 Pain location: Neck (posterior and B upper trap and B medial scapular area) Pain description: tight, sharp at times, constant dull ache Aggravating factors: heat, turning his head, looking up, extraneous activity such as yard work, lifting weights  Relieving factors: looking down, sitting with his hands supporting his head, sleep, massage, ice  PRECAUTIONS: No known precautions  RED FLAGS: Bowel or bladder incontinence: No and Cauda equina syndrome: No     WEIGHT BEARING RESTRICTIONS: No  FALLS:  Has patient fallen in last 6 months? 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   PLOF: Independent  PATIENT GOALS: pain relief  NEXT MD VISIT: 04/12/2024  OBJECTIVE:  Note: Objective measures were completed at Evaluation unless otherwise noted.  DIAGNOSTIC FINDINGS:  DG Cervical Spine Complete 09/30/2024 Narrative & Impression  CLINICAL DATA:  Chronic neck pain   EXAM: CERVICAL SPINE - COMPLETE 4+ VIEW   COMPARISON:  03/29/2019   FINDINGS: There is no evidence of cervical spine fracture or prevertebral soft tissue swelling. Alignment is normal. No other significant bone abnormalities are identified.    IMPRESSION: Negative cervical spine radiographs.     Electronically Signed   By: Judie Petit.  Shick M.D.   On: 10/10/2023 15:50            PATIENT SURVEYS:  NDI 17 (11/08/2023)  COGNITION: Overall cognitive status: Within functional limits for tasks assessed  SENSATION: WFL  POSTURE: cervical protraction, movement preference around C5-C6 area, B protracted shoulders, L lateral shift.  PALPATION: Cervical paraspinal muscle tension, B upper trap muscle tension R > L, decreased caudal glide R and L first rib  Decreased CPA mid to upper thoracic spine with reproduction of symptoims.     CERVICAL ROM:   Active ROM A/PROM (deg) eval  Flexion Full with B cervical paraspinal muscle pulling  Extension Limited with L medial scapular pulling and pain, and cervical spine pain.   Right lateral flexion WFL with L lateral neck tightness  Left lateral flexion WFL with R lateral neck tightness  Right rotation 65 with neck tightness  Left rotation 73 with neck tightness   (Blank rows = not tested)  UPPER EXTREMITY ROM:  Active ROM Right eval Left eval  Shoulder flexion Limited with medial scapular pain Limited with medial scapular pain  Shoulder extension    Shoulder abduction    Shoulder adduction    Shoulder extension    Shoulder internal rotation    Shoulder external rotation    Elbow flexion    Elbow extension    Wrist flexion    Wrist extension    Wrist ulnar deviation    Wrist radial deviation    Wrist pronation    Wrist supination     (Blank rows = not tested)  UPPER EXTREMITY MMT:  MMT Right eval Left eval  Shoulder flexion 4 4  Shoulder extension    Shoulder abduction 4 (with R lateral shoulder and arm pain) 4- with L shoulder pull  Shoulder adduction    Shoulder extension    Shoulder internal rotation 4- 4-  Shoulder external  rotation 4 with R lateral shoulder pain 4-  Middle trapezius 4- (with R lateral arm pain) 4-  Lower trapezius 4- (with R lateral  arm pain) 3+  Elbow flexion    Elbow extension    Wrist flexion    Wrist extension    Wrist ulnar deviation    Wrist radial deviation    Wrist pronation    Wrist supination    Grip strength     (Blank rows = not tested)  CERVICAL SPECIAL TESTS:   VBI testing: positive R side with pt reports of tunnel vision  Negative for L side. Pt was recommended to contact his PCP. Pt verbalized understanding.    FUNCTIONAL TESTS:    TREATMENT DATE: 12/06/23                                                                                                  Manual Therapy:  Supine STM to cervical region to increase extensibility of the paraspinals Supine suboccipital release technique to decrease cervicalgia Supine manual traction performed in order to increase joint space in cervical region for pain relief Supine cervical upglides/downglides, 30 sec bouts Supine UT/Levator stretch, 30 sec bouts to increase tissue extensibility of the cervical region   TherEx:  Seated levator stretch, 30 sec bouts x2 on L side Seated UT stretch, 30 sec bouts x2 on L side    PATIENT EDUCATION:  Education details: There-ex, HEP Person educated: Patient Education method: Explanation, Demonstration, Tactile cues, Verbal cues, and Handouts Education comprehension: verbalized understanding and returned demonstration  HOME EXERCISE PROGRAM: Access Code: [email or web address] URL: [email or web address] Date: 11/10/2023 Prepared by: Loralyn Freshwater  Exercises - Shoulder extension with resistance - Neutral  - 1 x daily - 7 x weekly - 2 sets - 10 reps - 5 seconds hold  Green band   Discontinued on 11/17/2023   Access Code: [email or web address] URL: [email or web address] Date: 11/17/2023 Prepared by: Loralyn Freshwater  Exercises - Supine Head Nod Deep Neck Flexor Training  - 3 x daily - 7 x weekly - 3 sets - 10 reps - 5 seconds hold  - Supine Scapular Retraction  - 1 x daily - 7 x weekly - 3 sets - 10 reps  - 5 seconds hold - Supine Shoulder Horizontal Abduction with Dumbbells  - 3 x daily - 7 x weekly - 3 sets - 10 reps - 5 seconds hold - First Rib Mobilization with Strap  - 3 x daily - 7 x weekly - 1 sets - 5 reps - 30 seconds hold - Seated Thoracic Lumbar Extension  - 1 x daily - 7 x weekly - 3 sets - 10 reps - 5 seconds hold     ASSESSMENT:  CLINICAL IMPRESSION:  Pt's late arrival dictated shortened session.  Pt responded well to the manual therapy and was able to tolerate manual therapy without any complications.  Pt with significant PT's noted in the UT and infraspinatus.  Education provided to pt about dry needling and benefit for pt with technique.   Pt will continue to benefit from skilled therapy to address remaining deficits in  order to improve overall QoL and return to PLOF.            OBJECTIVE IMPAIRMENTS: decreased ROM, decreased strength, improper body mechanics, postural dysfunction, and pain.   ACTIVITY LIMITATIONS: carrying, lifting, and reach over head  PARTICIPATION LIMITATIONS:   PERSONAL FACTORS: Fitness, Profession, Time since onset of injury/illness/exacerbation, and 1 comorbidity: depression  are also affecting patient's functional outcome.   REHAB POTENTIAL: Fair    CLINICAL DECISION MAKING: Stable/uncomplicated  EVALUATION COMPLEXITY: Low   GOALS: Goals reviewed with patient? Yes  SHORT TERM GOALS: Target date: 11/19/2023  Pt will be independent with his initial HEP to decrease pain, improve strength, function, ability to look around as well as lift without neck pain.  Baseline: Pt has not yet started his initial HEP (11/08/2023) Goal status: INITIAL    LONG TERM GOALS: Target date: 01/07/2024  Pt will have a decrease in neck pain to 3/10 or less at worst to promote ability to look around, lift, perform yard work as well as work out at [employer] more comfortably for his neck.  Baseline: 7/10 at most for the past 3 months (11/08/2023) Goal status:  INITIAL  2.  Pt will improve B lower, middle, ER, IR strength by at least 1/2 MMT grade to promote ability to look around, and lift with less neck pain.  Baseline:  MMT Right eval Left eval  Shoulder internal rotation 4- 4-  Shoulder external rotation 4 with R lateral shoulder pain 4-  Middle trapezius 4- (with R lateral arm pain) 4-  Lower trapezius 4- (with R lateral arm pain) 3+   Goal status: INITIAL  3.  Pt will have a decrease in Neck Disability Index (NDI) score by at least 7 points as a demonstration of improved function.  Baseline: NDI score of 17 suggesting moderate disability (11/08/2023) Goal status: INITIAL     PLAN:  PT FREQUENCY: 1-2x/week  PT DURATION: 8 weeks  PLANNED INTERVENTIONS: 97110-Therapeutic exercises, 97530- Therapeutic activity, 97112- Neuromuscular re-education, 97535- Self Care, 16109- Manual therapy, 97014- Electrical stimulation (unattended), and 97033- Ionotophoresis 4mg /ml Dexamethasone  PLAN FOR NEXT SESSION: posture, anterior cervical, lower trap strengthening, manual techniques, modalities PRN   Nolon Bussing, PT, DPT Physical Therapist - [facility]  [facility]  12/06/23, 10:05 AM

## 2023-12-08 ENCOUNTER — Ambulatory Visit: Payer: BC Managed Care – PPO

## 2023-12-08 DIAGNOSIS — M542 Cervicalgia: Secondary | ICD-10-CM | POA: Diagnosis not present

## 2023-12-08 NOTE — Therapy (Signed)
 OUTPATIENT PHYSICAL THERAPY TREATMENT  Patient Name: Jesus Gardner MRN: [id number] DOB:19-Jun-1991, 33 y.o., male Today's Date: 12/08/2023  END OF SESSION:  PT End of Session - 12/08/23 0954     Visit Number 8    Number of Visits 17    Date for PT Re-Evaluation 01/07/24    PT Start Time 0954    PT Stop Time 1051    PT Time Calculation (min) 57 min    Activity Tolerance Patient tolerated treatment well    Behavior During Therapy [facility] for tasks assessed/performed                   Past Medical History:  Diagnosis Date   Allergy    Costochondritis 12/30/2015   Depression    Elevated transaminase level 03/12/2016   GERD (gastroesophageal reflux disease)    History of chicken pox    Past Surgical History:  Procedure Laterality Date   COLONOSCOPY WITH PROPOFOL N/A 08/20/2020   Procedure: COLONOSCOPY WITH PROPOFOL;  Surgeon: Midge Minium, MD;  Location: [facility] ENDOSCOPY;  Service: Endoscopy;  Laterality: N/A;   None     Patient Active Problem List   Diagnosis Date Noted   Neck pain 09/07/2023   Intractable migraine with aura without status migrainosus 01/17/2023   BMI 40.0-44.9, adult (HCC) 01/17/2023   Diverticulosis 03/21/2019   OSA (obstructive sleep apnea) 12/06/2018   Allergic rhinitis 12/30/2015   Motion sickness 12/30/2015   Obesity 12/30/2015   Hyperlipidemia, mixed 03/06/2009   Fam hx-ischem heart disease 10/12/1998    PCP: Marjie Skiff, NP  REFERRING PROVIDER: Marjie Skiff, NP  REFERRING DIAG: M54.2 (ICD-10-CM) - Neck pain  THERAPY DIAG:  Cervicalgia  Rationale for Evaluation and Treatment: Rehabilitation  ONSET DATE: 2020  SUBJECTIVE:                                                                                                                                                                                                         SUBJECTIVE STATEMENT:   Neck and shoulder feels about the same. Had a L posterior headache. The  pectoralis stretch for L might have caused L neck tightness. Has had some random R arm pit pain since the pectoralis stretch. Has stress, Mom almost died during the past 2 years. Wife also had surgery for endometriosis.     Hand dominance: Right  PERTINENT HISTORY:  Neck pain. Pain began around 2020. Had L upper trap and L lateral neck pain. At one time around 2020, pt woke up one day with L UE and  LE pain. Went to the ER, pt did not have a CVA, or a hear attack. Pt was given muscle relaxer which did not help. Could not hold things with L hand and pt would drop things. Had massage therapy L upper trap area which helped. Currently has neck and B upper trap and B medial shoulder blade pain (scalene muscle referral pattern). Gets B posterior neck muscle tightness which causes cervicogenic headache.    No latex allergies  PAIN:  Are you having pain? Yes: NPRS scale: /10 Pain location: Neck (posterior and B upper trap and B medial scapular area) Pain description: tight, sharp at times, constant dull ache Aggravating factors: heat, turning his head, looking up, extraneous activity such as yard work, lifting weights  Relieving factors: looking down, sitting with his hands supporting his head, sleep, massage, ice  PRECAUTIONS: No known precautions  RED FLAGS: Bowel or bladder incontinence: No and Cauda equina syndrome: No     WEIGHT BEARING RESTRICTIONS: No  FALLS:  Has patient fallen in last 6 months? 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   PLOF: Independent  PATIENT GOALS: pain relief  NEXT MD VISIT: 04/12/2024  OBJECTIVE:  Note: Objective measures were completed at Evaluation unless otherwise noted.  DIAGNOSTIC FINDINGS:  DG Cervical Spine Complete 09/30/2024 Narrative & Impression  CLINICAL DATA:  Chronic neck pain   EXAM: CERVICAL SPINE - COMPLETE 4+ VIEW   COMPARISON:  03/29/2019   FINDINGS: There is no evidence of cervical spine fracture  or prevertebral soft tissue swelling. Alignment is normal. No other significant bone abnormalities are identified.   IMPRESSION: Negative cervical spine radiographs.     Electronically Signed   By: Judie Petit.  Shick M.D.   On: 10/10/2023 15:50            PATIENT SURVEYS:  NDI 17 (11/08/2023)  COGNITION: Overall cognitive status: Within functional limits for tasks assessed  SENSATION: WFL  POSTURE: cervical protraction, movement preference around C5-C6 area, B protracted shoulders, L lateral shift.  PALPATION: Cervical paraspinal muscle tension, B upper trap muscle tension R > L, decreased caudal glide R and L first rib  Decreased CPA mid to upper thoracic spine with reproduction of symptoims.     CERVICAL ROM:   Active ROM A/PROM (deg) eval  Flexion Full with B cervical paraspinal muscle pulling  Extension Limited with L medial scapular pulling and pain, and cervical spine pain.   Right lateral flexion WFL with L lateral neck tightness  Left lateral flexion WFL with R lateral neck tightness  Right rotation 65 with neck tightness  Left rotation 73 with neck tightness   (Blank rows = not tested)  UPPER EXTREMITY ROM:  Active ROM Right eval Left eval  Shoulder flexion Limited with medial scapular pain Limited with medial scapular pain  Shoulder extension    Shoulder abduction    Shoulder adduction    Shoulder extension    Shoulder internal rotation    Shoulder external rotation    Elbow flexion    Elbow extension    Wrist flexion    Wrist extension    Wrist ulnar deviation    Wrist radial deviation    Wrist pronation    Wrist supination     (Blank rows = not tested)  UPPER EXTREMITY MMT:  MMT Right eval Left eval  Shoulder flexion 4 4  Shoulder extension    Shoulder abduction 4 (with R lateral shoulder and arm pain) 4- with L shoulder  pull  Shoulder adduction    Shoulder extension    Shoulder internal rotation 4- 4-  Shoulder external rotation 4  with R lateral shoulder pain 4-  Middle trapezius 4- (with R lateral arm pain) 4-  Lower trapezius 4- (with R lateral arm pain) 3+  Elbow flexion    Elbow extension    Wrist flexion    Wrist extension    Wrist ulnar deviation    Wrist radial deviation    Wrist pronation    Wrist supination    Grip strength     (Blank rows = not tested)  CERVICAL SPECIAL TESTS:   VBI testing: positive R side with pt reports of tunnel vision  Negative for L side. Pt was recommended to contact his PCP. Pt verbalized understanding.    FUNCTIONAL TESTS:    TREATMENT DATE: 12/08/23                                                                                                  Manual Therapy:  Supine manual cervical traction   Supine R UPA C2, C3 TP grade 3 to decrease stiffness and promote mobility   Prone central P to A to mid to upper thoracic spine grade 3 to promote mobility   Supine STM to B cervical paraspinal muscles to decrease tension.  Supine STM to B upper trap muscles to decrease tension     TherEx:   Supine B scapular retraction 10x5 seconds for 3 sets   Supine cervical nod 10x5 seconds for 2 sets   Improved exercise technique, movement at target joints, use of target muscles after mod verbal, visual, tactile cues.      PATIENT EDUCATION:  Education details: There-ex, HEP Person educated: Patient Education method: Explanation, Demonstration, Tactile cues, Verbal cues, and Handouts Education comprehension: verbalized understanding and returned demonstration  HOME EXERCISE PROGRAM: Access Code: [email or web address] URL: [email or web address] Date: 11/10/2023 Prepared by: Loralyn Freshwater  Exercises - Shoulder extension with resistance - Neutral  - 1 x daily - 7 x weekly - 2 sets - 10 reps - 5 seconds hold  Green band   Discontinued on 11/17/2023   Access Code: [email or web address] URL: [email or web address] Date: 11/17/2023 Prepared by: Loralyn Freshwater  Exercises - Supine Head Nod Deep Neck Flexor Training  - 3 x daily - 7 x weekly - 3 sets - 10 reps - 5 seconds hold  - Supine Scapular Retraction  - 1 x daily - 7 x weekly - 3 sets - 10 reps - 5 seconds hold - Supine Shoulder Horizontal Abduction with Dumbbells  - 3 x daily - 7 x weekly - 3 sets - 10 reps - 5 seconds hold - First Rib Mobilization with Strap  - 3 x daily - 7 x weekly - 1 sets - 5 reps - 30 seconds hold - Seated Thoracic Lumbar Extension  - 1 x daily - 7 x weekly - 3 sets - 10 reps - 5 seconds hold     ASSESSMENT:  CLINICAL IMPRESSION:  Worked on improving cervical vertebral mobility, decreasing muscle tension to his neck and decreasing  compression pressure to his neck, followed by scapular and anterior cervical muscle activation and strengthening. No neck and B upper trap pain reported after session. Pt reports mid back tightness afterwards. Pt tolerated session well without aggravation of symptoms. Pt will benefit from continued skilled physical therapy services to decrease pain, improve strength and function.          OBJECTIVE IMPAIRMENTS: decreased ROM, decreased strength, improper body mechanics, postural dysfunction, and pain.   ACTIVITY LIMITATIONS: carrying, lifting, and reach over head  PARTICIPATION LIMITATIONS:   PERSONAL FACTORS: Fitness, Profession, Time since onset of injury/illness/exacerbation, and 1 comorbidity: depression  are also affecting patient's functional outcome.   REHAB POTENTIAL: Fair    CLINICAL DECISION MAKING: Stable/uncomplicated  EVALUATION COMPLEXITY: Low   GOALS: Goals reviewed with patient? Yes  SHORT TERM GOALS: Target date: 11/19/2023  Pt will be independent with his initial HEP to decrease pain, improve strength, function, ability to look around as well as lift without neck pain.  Baseline: Pt has not yet started his initial HEP (11/08/2023) Goal status: INITIAL    LONG TERM GOALS: Target date:  01/07/2024  Pt will have a decrease in neck pain to 3/10 or less at worst to promote ability to look around, lift, perform yard work as well as work out at [employer] more comfortably for his neck.  Baseline: 7/10 at most for the past 3 months (11/08/2023) Goal status: INITIAL  2.  Pt will improve B lower, middle, ER, IR strength by at least 1/2 MMT grade to promote ability to look around, and lift with less neck pain.  Baseline:  MMT Right eval Left eval  Shoulder internal rotation 4- 4-  Shoulder external rotation 4 with R lateral shoulder pain 4-  Middle trapezius 4- (with R lateral arm pain) 4-  Lower trapezius 4- (with R lateral arm pain) 3+   Goal status: INITIAL  3.  Pt will have a decrease in Neck Disability Index (NDI) score by at least 7 points as a demonstration of improved function.  Baseline: NDI score of 17 suggesting moderate disability (11/08/2023) Goal status: INITIAL     PLAN:  PT FREQUENCY: 1-2x/week  PT DURATION: 8 weeks  PLANNED INTERVENTIONS: 97110-Therapeutic exercises, 97530- Therapeutic activity, 97112- Neuromuscular re-education, 97535- Self Care, 62130- Manual therapy, 97014- Electrical stimulation (unattended), and 97033- Ionotophoresis 4mg /ml Dexamethasone  PLAN FOR NEXT SESSION: posture, anterior cervical, lower trap strengthening, manual techniques, modalities PRN  Loralyn Freshwater PT, DPT Physical Therapist - [facility]  [facility]  12/08/23, 11:05 AM

## 2023-12-09 ENCOUNTER — Ambulatory Visit: Payer: BC Managed Care – PPO | Admitting: Gastroenterology

## 2023-12-11 ENCOUNTER — Encounter: Payer: Self-pay | Admitting: Nurse Practitioner

## 2023-12-13 ENCOUNTER — Ambulatory Visit: Payer: BC Managed Care – PPO | Attending: Nurse Practitioner

## 2023-12-13 ENCOUNTER — Other Ambulatory Visit: Payer: Self-pay

## 2023-12-13 DIAGNOSIS — M542 Cervicalgia: Secondary | ICD-10-CM | POA: Insufficient documentation

## 2023-12-13 MED ORDER — METHOCARBAMOL 750 MG PO TABS
750.0000 mg | ORAL_TABLET | Freq: Three times a day (TID) | ORAL | 3 refills | Status: DC | PRN
Start: 2023-12-13 — End: 2024-08-14

## 2023-12-13 NOTE — Therapy (Signed)
 OUTPATIENT PHYSICAL THERAPY TREATMENT  Patient Name: Jesus Gardner MRN: [id number] DOB:[birth year], 33 y.o., male Today's Date: 12/13/2023  END OF SESSION:  PT End of Session - 12/13/23 0957     Visit Number 9    Number of Visits 17    Date for PT Re-Evaluation 01/07/24    PT Start Time 0957   pt arrived late   PT Stop Time 1046    PT Time Calculation (min) 49 min    Activity Tolerance Patient tolerated treatment well    Behavior During Therapy [facility] for tasks assessed/performed                    Past Medical History:  Diagnosis Date   Allergy    Costochondritis 12/30/2015   Depression    Elevated transaminase level 03/12/2016   GERD (gastroesophageal reflux disease)    History of chicken pox    Past Surgical History:  Procedure Laterality Date   COLONOSCOPY WITH PROPOFOL N/A 08/20/2020   Procedure: COLONOSCOPY WITH PROPOFOL;  Surgeon: Midge Minium, MD;  Location: [facility] ENDOSCOPY;  Service: Endoscopy;  Laterality: N/A;   None     Patient Active Problem List   Diagnosis Date Noted   Neck pain 09/07/2023   Intractable migraine with aura without status migrainosus 01/17/2023   BMI 40.0-44.9, adult (HCC) 01/17/2023   Diverticulosis 03/21/2019   OSA (obstructive sleep apnea) 12/06/2018   Allergic rhinitis 12/30/2015   Motion sickness 12/30/2015   Obesity 12/30/2015   Hyperlipidemia, mixed 03/06/2009   Fam hx-ischem heart disease 10/12/1998    PCP: Marjie Skiff, NP  REFERRING PROVIDER: Marjie Skiff, NP  REFERRING DIAG: M54.2 (ICD-10-CM) - Neck pain  THERAPY DIAG:  Cervicalgia  Rationale for Evaluation and Treatment: Rehabilitation  ONSET DATE: 2020  SUBJECTIVE:                                                                                                                                                                                                         SUBJECTIVE STATEMENT:  Neck and upper trap are about the same. Was a little better  after last session last week. Has not had a headache recently.      Hand dominance: Right  PERTINENT HISTORY:  Neck pain. Pain began around 2020. Had L upper trap and L lateral neck pain. At one time around 2020, pt woke up one day with L UE and LE pain. Went to the ER, pt did not have a CVA, or a hear attack. Pt was given muscle relaxer which did  not help. Could not hold things with L hand and pt would drop things. Had massage therapy L upper trap area which helped. Currently has neck and B upper trap and B medial shoulder blade pain (scalene muscle referral pattern). Gets B posterior neck muscle tightness which causes cervicogenic headache.    No latex allergies  PAIN:  Are you having pain? Yes: NPRS scale: /10 Pain location: Neck (posterior and B upper trap and B medial scapular area) Pain description: tight, sharp at times, constant dull ache Aggravating factors: heat, turning his head, looking up, extraneous activity such as yard work, lifting weights  Relieving factors: looking down, sitting with his hands supporting his head, sleep, massage, ice  PRECAUTIONS: No known precautions  RED FLAGS: Bowel or bladder incontinence: No and Cauda equina syndrome: No     WEIGHT BEARING RESTRICTIONS: No  FALLS:  Has patient fallen in last 6 months? 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   PLOF: Independent  PATIENT GOALS: pain relief  NEXT MD VISIT: 04/12/2024  OBJECTIVE:  Note: Objective measures were completed at Evaluation unless otherwise noted.  DIAGNOSTIC FINDINGS:  DG Cervical Spine Complete 09/30/2024 Narrative & Impression  CLINICAL DATA:  Chronic neck pain   EXAM: CERVICAL SPINE - COMPLETE 4+ VIEW   COMPARISON:  03/29/2019   FINDINGS: There is no evidence of cervical spine fracture or prevertebral soft tissue swelling. Alignment is normal. No other significant bone abnormalities are identified.   IMPRESSION: Negative cervical  spine radiographs.     Electronically Signed   By: Judie Petit.  Shick M.D.   On: 10/10/2023 15:50            PATIENT SURVEYS:  NDI 17 (11/08/2023)  COGNITION: Overall cognitive status: Within functional limits for tasks assessed  SENSATION: WFL  POSTURE: cervical protraction, movement preference around C5-C6 area, B protracted shoulders, L lateral shift.  PALPATION: Cervical paraspinal muscle tension, B upper trap muscle tension R > L, decreased caudal glide R and L first rib  Decreased CPA mid to upper thoracic spine with reproduction of symptoims.     CERVICAL ROM:   Active ROM A/PROM (deg) eval  Flexion Full with B cervical paraspinal muscle pulling  Extension Limited with L medial scapular pulling and pain, and cervical spine pain.   Right lateral flexion WFL with L lateral neck tightness  Left lateral flexion WFL with R lateral neck tightness  Right rotation 65 with neck tightness  Left rotation 73 with neck tightness   (Blank rows = not tested)  UPPER EXTREMITY ROM:  Active ROM Right eval Left eval  Shoulder flexion Limited with medial scapular pain Limited with medial scapular pain  Shoulder extension    Shoulder abduction    Shoulder adduction    Shoulder extension    Shoulder internal rotation    Shoulder external rotation    Elbow flexion    Elbow extension    Wrist flexion    Wrist extension    Wrist ulnar deviation    Wrist radial deviation    Wrist pronation    Wrist supination     (Blank rows = not tested)  UPPER EXTREMITY MMT:  MMT Right eval Left eval  Shoulder flexion 4 4  Shoulder extension    Shoulder abduction 4 (with R lateral shoulder and arm pain) 4- with L shoulder pull  Shoulder adduction    Shoulder extension    Shoulder internal rotation 4- 4-  Shoulder external rotation 4 with  R lateral shoulder pain 4-  Middle trapezius 4- (with R lateral arm pain) 4-  Lower trapezius 4- (with R lateral arm pain) 3+  Elbow flexion     Elbow extension    Wrist flexion    Wrist extension    Wrist ulnar deviation    Wrist radial deviation    Wrist pronation    Wrist supination    Grip strength     (Blank rows = not tested)  CERVICAL SPECIAL TESTS:   VBI testing: positive R side with pt reports of tunnel vision  Negative for L side. Pt was recommended to contact his PCP. Pt verbalized understanding.    FUNCTIONAL TESTS:    TREATMENT DATE: 12/13/23                                                                                                  Manual Therapy:  Supine with towel roll behind thoracic spine.   Supine manual cervical traction   Supine R UPA C2, C3 TP grade 3 to decrease stiffness and promote mobility   Supine STM to B cervical paraspinal muscles to decrease tension.  Seated STM B thoracic paraspinal muscles to decrease tension   Supine STM to B upper trap muscles to decrease tension   Seated caudal glide R first rib grade 3 to promote mobility    TherEx:   Supine with towel roll behind thoracic kyphosis  Chin tuck 10x3 with 5 second holds   Supine B shoulder flexion 10x to promote thoracic extension    Star gazer stretch with PT assist 10x5 seconds to promote pectoralis stretch and scapular retraction and decrease over activation of upper trap muscles.   Seated thoracic extension against chair 10x5 second holds     Improved exercise technique, movement at target joints, use of target muscles after mod verbal, visual, tactile cues.      PATIENT EDUCATION:  Education details: There-ex, HEP Person educated: Patient Education method: Explanation, Demonstration, Tactile cues, Verbal cues, and Handouts Education comprehension: verbalized understanding and returned demonstration  HOME EXERCISE PROGRAM: Access Code: [email or web address] URL: [email or web address] Date: 11/10/2023 Prepared by: Loralyn Freshwater  Exercises - Shoulder extension with resistance - Neutral  - 1 x daily  - 7 x weekly - 2 sets - 10 reps - 5 seconds hold  Green band   Discontinued on 11/17/2023   Access Code: [email or web address] URL: [email or web address] Date: 11/17/2023 Prepared by: Loralyn Freshwater  Exercises - Supine Head Nod Deep Neck Flexor Training  - 3 x daily - 7 x weekly - 3 sets - 10 reps - 5 seconds hold  - Supine Scapular Retraction  - 1 x daily - 7 x weekly - 3 sets - 10 reps - 5 seconds hold - Supine Shoulder Horizontal Abduction with Dumbbells  - 3 x daily - 7 x weekly - 3 sets - 10 reps - 5 seconds hold - First Rib Mobilization with Strap  - 3 x daily - 7 x weekly - 1 sets - 5 reps - 30 seconds hold - Seated Thoracic Lumbar Extension  - 1 x daily -  7 x weekly - 3 sets - 10 reps - 5 seconds hold     ASSESSMENT:  CLINICAL IMPRESSION:  Decreased headaches and improved comfort level for his neck after last session based on subjective reports.  Continued working on improving cervical vertebral and thoracic mobility, decreasing muscle tension to his neck and decreasing compression pressure to his neck. Also worked on promoting scapular retraction/pectoralis muscle stretch to decrease over activation of his upper trap muscles. Pt tolerated session well without aggravation of symptoms.  Pt will benefit from continued skilled physical therapy services to decrease pain, improve strength and function.          OBJECTIVE IMPAIRMENTS: decreased ROM, decreased strength, improper body mechanics, postural dysfunction, and pain.   ACTIVITY LIMITATIONS: carrying, lifting, and reach over head  PARTICIPATION LIMITATIONS:   PERSONAL FACTORS: Fitness, Profession, Time since onset of injury/illness/exacerbation, and 1 comorbidity: depression  are also affecting patient's functional outcome.   REHAB POTENTIAL: Fair    CLINICAL DECISION MAKING: Stable/uncomplicated  EVALUATION COMPLEXITY: Low   GOALS: Goals reviewed with patient? Yes  SHORT TERM GOALS: Target date:  11/19/2023  Pt will be independent with his initial HEP to decrease pain, improve strength, function, ability to look around as well as lift without neck pain.  Baseline: Pt has not yet started his initial HEP (11/08/2023) Goal status: INITIAL    LONG TERM GOALS: Target date: 01/07/2024  Pt will have a decrease in neck pain to 3/10 or less at worst to promote ability to look around, lift, perform yard work as well as work out at [employer] more comfortably for his neck.  Baseline: 7/10 at most for the past 3 months (11/08/2023) Goal status: INITIAL  2.  Pt will improve B lower, middle, ER, IR strength by at least 1/2 MMT grade to promote ability to look around, and lift with less neck pain.  Baseline:  MMT Right eval Left eval  Shoulder internal rotation 4- 4-  Shoulder external rotation 4 with R lateral shoulder pain 4-  Middle trapezius 4- (with R lateral arm pain) 4-  Lower trapezius 4- (with R lateral arm pain) 3+   Goal status: INITIAL  3.  Pt will have a decrease in Neck Disability Index (NDI) score by at least 7 points as a demonstration of improved function.  Baseline: NDI score of 17 suggesting moderate disability (11/08/2023) Goal status: INITIAL     PLAN:  PT FREQUENCY: 1-2x/week  PT DURATION: 8 weeks  PLANNED INTERVENTIONS: 97110-Therapeutic exercises, 97530- Therapeutic activity, 97112- Neuromuscular re-education, 97535- Self Care, 40981- Manual therapy, 97014- Electrical stimulation (unattended), and 97033- Ionotophoresis 4mg /ml Dexamethasone  PLAN FOR NEXT SESSION: posture, anterior cervical, lower trap strengthening, manual techniques, modalities PRN  Loralyn Freshwater PT, DPT Physical Therapist - [facility]  [facility]  12/13/23, 10:54 AM

## 2023-12-15 ENCOUNTER — Ambulatory Visit: Payer: BC Managed Care – PPO

## 2023-12-15 DIAGNOSIS — M542 Cervicalgia: Secondary | ICD-10-CM | POA: Diagnosis not present

## 2023-12-15 NOTE — Therapy (Signed)
 OUTPATIENT PHYSICAL THERAPY TREATMENT And Progress Report (11/08/2023 - 12/15/2023)  Patient Name: Jesus Gardner MRN: [id number] DOB:[birth year], 33 y.o., male Today's Date: 12/15/2023  END OF SESSION:  PT End of Session - 12/15/23 1038     Visit Number 10    Number of Visits 17    Date for PT Re-Evaluation 01/07/24    PT Start Time 1038    PT Stop Time 1114    PT Time Calculation (min) 36 min    Activity Tolerance Patient tolerated treatment well    Behavior During Therapy [facility] for tasks assessed/performed                     Past Medical History:  Diagnosis Date   Allergy    Costochondritis 12/30/2015   Depression    Elevated transaminase level 03/12/2016   GERD (gastroesophageal reflux disease)    History of chicken pox    Past Surgical History:  Procedure Laterality Date   COLONOSCOPY WITH PROPOFOL N/A 08/20/2020   Procedure: COLONOSCOPY WITH PROPOFOL;  Surgeon: Midge Minium, MD;  Location: [facility] ENDOSCOPY;  Service: Endoscopy;  Laterality: N/A;   None     Patient Active Problem List   Diagnosis Date Noted   Neck pain 09/07/2023   Intractable migraine with aura without status migrainosus 01/17/2023   BMI 40.0-44.9, adult (HCC) 01/17/2023   Diverticulosis 03/21/2019   OSA (obstructive sleep apnea) 12/06/2018   Allergic rhinitis 12/30/2015   Motion sickness 12/30/2015   Obesity 12/30/2015   Hyperlipidemia, mixed 03/06/2009   Fam hx-ischem heart disease 10/12/1998    PCP: Marjie Skiff, NP  REFERRING PROVIDER: Marjie Skiff, NP  REFERRING DIAG: M54.2 (ICD-10-CM) - Neck pain  THERAPY DIAG:  Cervicalgia  Rationale for Evaluation and Treatment: Rehabilitation  ONSET DATE: 2020  SUBJECTIVE:                                                                                                                                                                                                         SUBJECTIVE STATEMENT:  Not feeling as much tightness  constantly. Still feels a dull ache. 2/10 B upper trap soreness currently. Has discomfort at the base of the scull which has always been there.       Hand dominance: Right  PERTINENT HISTORY:  Neck pain. Pain began around 2020. Had L upper trap and L lateral neck pain. At one time around 2020, pt woke up one day with L UE and LE pain. Went to the ER, pt did not have a CVA,  or a hear attack. Pt was given muscle relaxer which did not help. Could not hold things with L hand and pt would drop things. Had massage therapy L upper trap area which helped. Currently has neck and B upper trap and B medial shoulder blade pain (scalene muscle referral pattern). Gets B posterior neck muscle tightness which causes cervicogenic headache.    No latex allergies  PAIN:  Are you having pain? Yes: NPRS scale: /10 Pain location: Neck (posterior and B upper trap and B medial scapular area) Pain description: tight, sharp at times, constant dull ache Aggravating factors: heat, turning his head, looking up, extraneous activity such as yard work, lifting weights  Relieving factors: looking down, sitting with his hands supporting his head, sleep, massage, ice  PRECAUTIONS: No known precautions  RED FLAGS: Bowel or bladder incontinence: No and Cauda equina syndrome: No     WEIGHT BEARING RESTRICTIONS: No  FALLS:  Has patient fallen in last 6 months? 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   PLOF: Independent  PATIENT GOALS: pain relief  NEXT MD VISIT: 04/12/2024  OBJECTIVE:  Note: Objective measures were completed at Evaluation unless otherwise noted.  DIAGNOSTIC FINDINGS:  DG Cervical Spine Complete 09/30/2024 Narrative & Impression  CLINICAL DATA:  Chronic neck pain   EXAM: CERVICAL SPINE - COMPLETE 4+ VIEW   COMPARISON:  03/29/2019   FINDINGS: There is no evidence of cervical spine fracture or prevertebral soft tissue swelling. Alignment is normal. No other  significant bone abnormalities are identified.   IMPRESSION: Negative cervical spine radiographs.     Electronically Signed   By: Judie Petit.  Shick M.D.   On: 10/10/2023 15:50            PATIENT SURVEYS:  NDI 17 (11/08/2023)  COGNITION: Overall cognitive status: Within functional limits for tasks assessed  SENSATION: WFL  POSTURE: cervical protraction, movement preference around C5-C6 area, B protracted shoulders, L lateral shift.  PALPATION: Cervical paraspinal muscle tension, B upper trap muscle tension R > L, decreased caudal glide R and L first rib  Decreased CPA mid to upper thoracic spine with reproduction of symptoims.     CERVICAL ROM:   Active ROM A/PROM (deg) eval  Flexion Full with B cervical paraspinal muscle pulling  Extension Limited with L medial scapular pulling and pain, and cervical spine pain.   Right lateral flexion WFL with L lateral neck tightness  Left lateral flexion WFL with R lateral neck tightness  Right rotation 65 with neck tightness  Left rotation 73 with neck tightness   (Blank rows = not tested)  UPPER EXTREMITY ROM:  Active ROM Right eval Left eval  Shoulder flexion Limited with medial scapular pain Limited with medial scapular pain  Shoulder extension    Shoulder abduction    Shoulder adduction    Shoulder extension    Shoulder internal rotation    Shoulder external rotation    Elbow flexion    Elbow extension    Wrist flexion    Wrist extension    Wrist ulnar deviation    Wrist radial deviation    Wrist pronation    Wrist supination     (Blank rows = not tested)  UPPER EXTREMITY MMT:  MMT Right eval Left eval  Shoulder flexion 4 4  Shoulder extension    Shoulder abduction 4 (with R lateral shoulder and arm pain) 4- with L shoulder pull  Shoulder adduction    Shoulder extension  Shoulder internal rotation 4- 4-  Shoulder external rotation 4 with R lateral shoulder pain 4-  Middle trapezius 4- (with R lateral  arm pain) 4-  Lower trapezius 4- (with R lateral arm pain) 3+  Elbow flexion    Elbow extension    Wrist flexion    Wrist extension    Wrist ulnar deviation    Wrist radial deviation    Wrist pronation    Wrist supination    Grip strength     (Blank rows = not tested)  CERVICAL SPECIAL TESTS:   VBI testing: positive R side with pt reports of tunnel vision  Negative for L side. Pt was recommended to contact his PCP. Pt verbalized understanding.    FUNCTIONAL TESTS:    TREATMENT DATE: 12/15/23                                                                                                  Manual Therapy:  Supine with towel roll behind thoracic spine.   Supine manual cervical traction   Supine R UPA C2, C3 TP grade 3 to decrease stiffness and promote mobility   Supine STM to B cervical paraspinal muscles to decrease tension.  Supine suboccipital release to decrease tension.     TherEx:   Manually resisted shoulder ER, IR, lower and middle trap raise 1x each way for each UE  Reviewed progress/current status with PT towards goals   Supine with towel roll behind thoracic kyphosis  Chin tuck 10x2 with 5 second holds   Seated press-up 10x2 with 5 second holds  difficult     Improved exercise technique, movement at target joints, use of target muscles after mod verbal, visual, tactile cues.      PATIENT EDUCATION:  Education details: There-ex, HEP Person educated: Patient Education method: Explanation, Demonstration, Tactile cues, Verbal cues, and Handouts Education comprehension: verbalized understanding and returned demonstration  HOME EXERCISE PROGRAM: Access Code: [email or web address] URL: [email or web address] Date: 11/10/2023 Prepared by: Loralyn Freshwater  Exercises - Shoulder extension with resistance - Neutral  - 1 x daily - 7 x weekly - 2 sets - 10 reps - 5 seconds hold  Green band   Discontinued on 11/17/2023   Access Code: [email or web address] URL:  [email or web address] Date: 11/17/2023 Prepared by: Loralyn Freshwater  Exercises - Supine Head Nod Deep Neck Flexor Training  - 3 x daily - 7 x weekly - 3 sets - 10 reps - 5 seconds hold  - Supine Scapular Retraction  - 1 x daily - 7 x weekly - 3 sets - 10 reps - 5 seconds hold - Supine Shoulder Horizontal Abduction with Dumbbells  - 3 x daily - 7 x weekly - 3 sets - 10 reps - 5 seconds hold - First Rib Mobilization with Strap  - 3 x daily - 7 x weekly - 1 sets - 5 reps - 30 seconds hold - Seated Thoracic Lumbar Extension  - 1 x daily - 7 x weekly - 3 sets - 10 reps - 5 seconds hold     ASSESSMENT:  CLINICAL IMPRESSION:  Pt demonstrates overall decreased  neck, pain, improved B shoulder strength since initial evaluation. Overall, pt making progress with PT towards goals. Continued working on cervical vertebral mobility, improving anterior cervical strength as well as scapular strength, and decreasing cervical paraspinal muscle tension to his neck. Decreased pain at the based of his skull after treatment reported. Pt will benefit from continued skilled physical therapy services to decrease pain, improve strength and function.          OBJECTIVE IMPAIRMENTS: decreased ROM, decreased strength, improper body mechanics, postural dysfunction, and pain.   ACTIVITY LIMITATIONS: carrying, lifting, and reach over head  PARTICIPATION LIMITATIONS:   PERSONAL FACTORS: Fitness, Profession, Time since onset of injury/illness/exacerbation, and 1 comorbidity: depression  are also affecting patient's functional outcome.   REHAB POTENTIAL: Fair    CLINICAL DECISION MAKING: Stable/uncomplicated  EVALUATION COMPLEXITY: Low   GOALS: Goals reviewed with patient? Yes  SHORT TERM GOALS: Target date: 11/19/2023  Pt will be independent with his initial HEP to decrease pain, improve strength, function, ability to look around as well as lift without neck pain.  Baseline: Pt has not yet  started his initial HEP (11/08/2023); No questions with his HEP, Able to perform them (12/15/2023)  Goal status: MET    LONG TERM GOALS: Target date: 01/07/2024  Pt will have a decrease in neck pain to 3/10 or less at worst to promote ability to look around, lift, perform yard work as well as work out at [employer] more comfortably for his neck.  Baseline: 7/10 at most for the past 3 months (11/08/2023); 4/10 neck and upper trap pain at most for the past 7 days (12/15/2023) Goal status: Progressing  2.  Pt will improve B lower, middle trap, ER, IR strength by at least 1/2 MMT grade to promote ability to look around, and lift with less neck pain.  Baseline:  MMT Right eval Left eval R (12/15/2023) L  (12/15/2023)  Shoulder internal rotation 4- 4- 4+ 4+  Shoulder external rotation 4 with R lateral shoulder pain 4- 4 with R lateral shoulder pain 4  Middle trapezius 4- (with R lateral arm pain) 4- 4 (with R lateral arm pain) 4  Lower trapezius 4- (with R lateral arm pain) 3+ 4- (with R lateral arm pain) 4   Goal status: Partially met  3.  Pt will have a decrease in Neck Disability Index (NDI) score by at least 7 points as a demonstration of improved function.  Baseline: NDI score of 17 suggesting moderate disability (11/08/2023) Goal status: Ongoing     PLAN:  PT FREQUENCY: 1-2x/week  PT DURATION: 8 weeks  PLANNED INTERVENTIONS: 97110-Therapeutic exercises, 97530- Therapeutic activity, 97112- Neuromuscular re-education, 97535- Self Care, 16109- Manual therapy, 97014- Electrical stimulation (unattended), and 97033- Ionotophoresis 4mg /ml Dexamethasone  PLAN FOR NEXT SESSION: posture, anterior cervical, lower trap strengthening, manual techniques, modalities PRN   Thank you for your referral.  Loralyn Freshwater PT, DPT Physical Therapist - [facility]  12/15/23, 11:47 AM

## 2023-12-18 NOTE — Patient Instructions (Signed)
Earache, Adult An earache, or ear pain, can be caused by many things, including: An infection. Ear wax buildup. Ear pressure. Something in the ear that should not be there (foreign body). A sore throat. Tooth problems. Jaw problems. Treatment of the earache will depend on the cause. If the cause is not clear or cannot be known, you may need to watch your symptoms until your earache goes away or until a cause is found. Follow these instructions at home: Medicines Take or apply over-the-counter and prescription medicines only as told by your health care provider. If you were prescribed antibiotics, use them as told by your health care provider. Do not stop using the antibiotic even if you start to feel better. Do not put anything in your ear other than medicine that is prescribed by your health care provider. Managing pain     If directed, apply heat to the affected area as often as told by your health care provider. Use the heat source that your health care provider recommends, such as a moist heat pack or a heating pad. Place a towel between your skin and the heat source. Leave the heat on for 20-30 minutes. If your skin turns bright red, remove the heat right away to prevent burns. The risk of burns is higher if you cannot feel pain, heat, or cold. If directed, put ice on the affected area. To do this: Put ice in a plastic bag. Place a towel between your skin and the bag. Leave the ice on for 20 minutes, 2-3 times a day. If your skin turns bright red, remove the ice right away to prevent skin damage. The risk of skin damage is higher if you cannot feel pain, heat, or cold.  General instructions Pay attention to any changes in your symptoms. Try resting in an upright position instead of lying down. This may help to reduce pressure in your ear and relieve pain. Chew gum if it helps to relieve your ear pain. Treat any allergies as told by your health care provider. Drink enough fluid  to keep your urine pale yellow. It is up to you to get the results of any tests that were done. Ask your health care provider, or the department that is doing the tests, when your results will be ready. Contact a health care provider if: Your pain does not improve within 2 days. Your earache gets worse. You have new symptoms. You have a fever. Get help right away if: You have a severe headache. You have a stiff neck. You have trouble swallowing. You have redness or swelling behind your ear. You have fluid or blood coming from your ear. You have hearing loss. You feel dizzy. This information is not intended to replace advice given to you by your health care provider. Make sure you discuss any questions you have with your health care provider. Document Revised: 02/09/2022 Document Reviewed: 02/09/2022 Elsevier Patient Education  2024 [employer]  

## 2023-12-20 ENCOUNTER — Ambulatory Visit (INDEPENDENT_AMBULATORY_CARE_PROVIDER_SITE_OTHER): Payer: BC Managed Care – PPO | Admitting: Nurse Practitioner

## 2023-12-20 ENCOUNTER — Encounter: Payer: Self-pay | Admitting: Nurse Practitioner

## 2023-12-20 ENCOUNTER — Ambulatory Visit: Payer: BC Managed Care – PPO

## 2023-12-20 VITALS — BP 116/78 | HR 73 | Temp 98.7°F | Ht 74.0 in | Wt 338.0 lb

## 2023-12-20 DIAGNOSIS — H938X3 Other specified disorders of ear, bilateral: Secondary | ICD-10-CM | POA: Diagnosis not present

## 2023-12-20 DIAGNOSIS — M542 Cervicalgia: Secondary | ICD-10-CM | POA: Diagnosis not present

## 2023-12-20 NOTE — Therapy (Signed)
 OUTPATIENT PHYSICAL THERAPY TREATMENT   Patient Name: Jesus Gardner MRN: [id number] DOB:[birth year], 33 y.o., male Today's Date: 12/20/2023  END OF SESSION:  PT End of Session - 12/20/23 1434     Visit Number 11    Number of Visits 17    Date for PT Re-Evaluation 01/07/24    PT Start Time 1435    PT Stop Time 1518    PT Time Calculation (min) 43 min    Activity Tolerance Patient tolerated treatment well    Behavior During Therapy [facility] for tasks assessed/performed                      Past Medical History:  Diagnosis Date   Allergy    Costochondritis 12/30/2015   Depression    Elevated transaminase level 03/12/2016   GERD (gastroesophageal reflux disease)    History of chicken pox    Past Surgical History:  Procedure Laterality Date   COLONOSCOPY WITH PROPOFOL N/A 08/20/2020   Procedure: COLONOSCOPY WITH PROPOFOL;  Surgeon: Midge Minium, MD;  Location: [facility] ENDOSCOPY;  Service: Endoscopy;  Laterality: N/A;   None     Patient Active Problem List   Diagnosis Date Noted   Ear fullness, bilateral 12/20/2023   Neck pain 09/07/2023   Intractable migraine with aura without status migrainosus 01/17/2023   BMI 40.0-44.9, adult (HCC) 01/17/2023   Diverticulosis 03/21/2019   OSA (obstructive sleep apnea) 12/06/2018   Allergic rhinitis 12/30/2015   Motion sickness 12/30/2015   Obesity 12/30/2015   Hyperlipidemia, mixed 03/06/2009   Fam hx-ischem heart disease 10/12/1998    PCP: Marjie Skiff, NP  REFERRING PROVIDER: Marjie Skiff, NP  REFERRING DIAG: M54.2 (ICD-10-CM) - Neck pain  THERAPY DIAG:  Cervicalgia  Rationale for Evaluation and Treatment: Rehabilitation  ONSET DATE: 2020  SUBJECTIVE:                                                                                                                                                                                                         SUBJECTIVE STATEMENT:  Neck/upper trap is intermittent.  Has had pain in R side of his neck a few times. 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 Not as bad as the worst he's felt but not as good as the best he's felt. 3/10 neck symptoms currently.         Hand dominance: Right  PERTINENT HISTORY:  Neck pain. Pain began around 2020. Had L upper trap and L lateral neck pain. At one time around  2020, pt woke up one day with L UE and LE pain. Went to the ER, pt did not have a CVA, or a hear attack. Pt was given muscle relaxer which did not help. Could not hold things with L hand and pt would drop things. Had massage therapy L upper trap area which helped. Currently has neck and B upper trap and B medial shoulder blade pain (scalene muscle referral pattern). Gets B posterior neck muscle tightness which causes cervicogenic headache.    No latex allergies  PAIN:  Are you having pain? Yes: NPRS scale: /10 Pain location: Neck (posterior and B upper trap and B medial scapular area) Pain description: tight, sharp at times, constant dull ache Aggravating factors: heat, turning his head, looking up, extraneous activity such as yard work, lifting weights  Relieving factors: looking down, sitting with his hands supporting his head, sleep, massage, ice  PRECAUTIONS: No known precautions  RED FLAGS: Bowel or bladder incontinence: No and Cauda equina syndrome: No     WEIGHT BEARING RESTRICTIONS: No  FALLS:  Has patient fallen in last 6 months? 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   PLOF: Independent  PATIENT GOALS: pain relief  NEXT MD VISIT: 04/12/2024  OBJECTIVE:  Note: Objective measures were completed at Evaluation unless otherwise noted.  DIAGNOSTIC FINDINGS:  DG Cervical Spine Complete 09/30/2024 Narrative & Impression  CLINICAL DATA:  Chronic neck pain   EXAM: CERVICAL SPINE - COMPLETE 4+ VIEW   COMPARISON:  03/29/2019   FINDINGS: There is no evidence of cervical spine  fracture or prevertebral soft tissue swelling. Alignment is normal. No other significant bone abnormalities are identified.   IMPRESSION: Negative cervical spine radiographs.     Electronically Signed   By: Judie Petit.  Shick M.D.   On: 10/10/2023 15:50            PATIENT SURVEYS:  NDI 17 (11/08/2023)  COGNITION: Overall cognitive status: Within functional limits for tasks assessed  SENSATION: WFL  POSTURE: cervical protraction, movement preference around C5-C6 area, B protracted shoulders, L lateral shift.  PALPATION: Cervical paraspinal muscle tension, B upper trap muscle tension R > L, decreased caudal glide R and L first rib  Decreased CPA mid to upper thoracic spine with reproduction of symptoims.     CERVICAL ROM:   Active ROM A/PROM (deg) eval  Flexion Full with B cervical paraspinal muscle pulling  Extension Limited with L medial scapular pulling and pain, and cervical spine pain.   Right lateral flexion WFL with L lateral neck tightness  Left lateral flexion WFL with R lateral neck tightness  Right rotation 65 with neck tightness  Left rotation 73 with neck tightness   (Blank rows = not tested)  UPPER EXTREMITY ROM:  Active ROM Right eval Left eval  Shoulder flexion Limited with medial scapular pain Limited with medial scapular pain  Shoulder extension    Shoulder abduction    Shoulder adduction    Shoulder extension    Shoulder internal rotation    Shoulder external rotation    Elbow flexion    Elbow extension    Wrist flexion    Wrist extension    Wrist ulnar deviation    Wrist radial deviation    Wrist pronation    Wrist supination     (Blank rows = not tested)  UPPER EXTREMITY MMT:  MMT Right eval Left eval  Shoulder flexion 4 4  Shoulder extension    Shoulder abduction 4 (with  R lateral shoulder and arm pain) 4- with L shoulder pull  Shoulder adduction    Shoulder extension    Shoulder internal rotation 4- 4-  Shoulder external  rotation 4 with R lateral shoulder pain 4-  Middle trapezius 4- (with R lateral arm pain) 4-  Lower trapezius 4- (with R lateral arm pain) 3+  Elbow flexion    Elbow extension    Wrist flexion    Wrist extension    Wrist ulnar deviation    Wrist radial deviation    Wrist pronation    Wrist supination    Grip strength     (Blank rows = not tested)  CERVICAL SPECIAL TESTS:   VBI testing: positive R side with pt reports of tunnel vision  Negative for L side. Pt was recommended to contact his PCP. Pt verbalized understanding.    FUNCTIONAL TESTS:    TREATMENT DATE: 12/20/23                                                                                                  Manual Therapy: Supine with towel roll behind thoracic spine.   Supine manual cervical traction   Supine R and L UPA C2, C3, and R C4 TP grade 3 to decrease stiffness and promote mobility   Supine STM to B cervical paraspinal muscles to decrease tension. . Seated STM to R upper trap to decrease tension     TherEx:   Pt was recommended to use a lumbar towel roll in his chair in front of the computer to promote upright posture to decrease forward neck posture.    Supine with towel roll behind thoracic kyphosis  Chin tuck 10x3 with 5 second holds   Deep cervical flexion 10x2  Seated press-up 10x2 with 5 second holds  difficult   Improved exercise technique, movement at target joints, use of target muscles after mod verbal, visual, tactile cues.      PATIENT EDUCATION:  Education details: There-ex, HEP Person educated: Patient Education method: Explanation, Demonstration, Tactile cues, Verbal cues, and Handouts Education comprehension: verbalized understanding and returned demonstration  HOME EXERCISE PROGRAM: Access Code: [email or web address] URL: [email or web address] Date: 11/10/2023 Prepared by: Loralyn Freshwater  Exercises - Shoulder extension with resistance - Neutral  - 1 x daily - 7 x  weekly - 2 sets - 10 reps - 5 seconds hold  Green band   Discontinued on 11/17/2023   Access Code: [email or web address] URL: [email or web address] Date: 11/17/2023 Prepared by: Loralyn Freshwater  Exercises - Supine Head Nod Deep Neck Flexor Training  - 3 x daily - 7 x weekly - 3 sets - 10 reps - 5 seconds hold  - Supine Scapular Retraction  - 1 x daily - 7 x weekly - 3 sets - 10 reps - 5 seconds hold - Supine Shoulder Horizontal Abduction with Dumbbells  - 3 x daily - 7 x weekly - 3 sets - 10 reps - 5 seconds hold - First Rib Mobilization with Strap  - 3 x daily - 7 x weekly - 1 sets - 5 reps - 30 seconds hold - Seated  Thoracic Lumbar Extension  - 1 x daily - 7 x weekly - 3 sets - 10 reps - 5 seconds hold     ASSESSMENT:  CLINICAL IMPRESSION:  Recommended for pt to sit in an upright chair with back, with a lumbar towel roll to promote upright posture and 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 Pt verbalized understanding. Continued working on improving cervical joint mobility, decreasing cervical compression pressure, cervical paraspinal and upper trap muscle tension, as well as improving anterior cervical and B scapular depressor strength to help decrease muscle tension to his neck. Decrease neck tightness reported after treatment. Pt will benefit from continued skilled physical therapy services to decrease pain, improve strength and function.          OBJECTIVE IMPAIRMENTS: decreased ROM, decreased strength, improper body mechanics, postural dysfunction, and pain.   ACTIVITY LIMITATIONS: carrying, lifting, and reach over head  PARTICIPATION LIMITATIONS:   PERSONAL FACTORS: Fitness, Profession, Time since onset of injury/illness/exacerbation, and 1 comorbidity: depression  are also affecting patient's functional outcome.   REHAB POTENTIAL: Fair    CLINICAL DECISION MAKING: Stable/uncomplicated  EVALUATION COMPLEXITY: Low   GOALS: Goals  reviewed with patient? Yes  SHORT TERM GOALS: Target date: 11/19/2023  Pt will be independent with his initial HEP to decrease pain, improve strength, function, ability to look around as well as lift without neck pain.  Baseline: Pt has not yet started his initial HEP (11/08/2023); No questions with his HEP, Able to perform them (12/15/2023)  Goal status: MET    LONG TERM GOALS: Target date: 01/07/2024  Pt will have a decrease in neck pain to 3/10 or less at worst to promote ability to look around, lift, perform yard work as well as work out at [employer] more comfortably for his neck.  Baseline: 7/10 at most for the past 3 months (11/08/2023); 4/10 neck and upper trap pain at most for the past 7 days (12/15/2023) Goal status: Progressing  2.  Pt will improve B lower, middle trap, ER, IR strength by at least 1/2 MMT grade to promote ability to look around, and lift with less neck pain.  Baseline:  MMT Right eval Left eval R (12/15/2023) L  (12/15/2023)  Shoulder internal rotation 4- 4- 4+ 4+  Shoulder external rotation 4 with R lateral shoulder pain 4- 4 with R lateral shoulder pain 4  Middle trapezius 4- (with R lateral arm pain) 4- 4 (with R lateral arm pain) 4  Lower trapezius 4- (with R lateral arm pain) 3+ 4- (with R lateral arm pain) 4   Goal status: Partially met  3.  Pt will have a decrease in Neck Disability Index (NDI) score by at least 7 points as a demonstration of improved function.  Baseline: NDI score of 17 suggesting moderate disability (11/08/2023) Goal status: Ongoing     PLAN:  PT FREQUENCY: 1-2x/week  PT DURATION: 8 weeks  PLANNED INTERVENTIONS: 97110-Therapeutic exercises, 97530- Therapeutic activity, 97112- Neuromuscular re-education, 97535- Self Care, 40981- Manual therapy, 97014- Electrical stimulation (unattended), and 97033- Ionotophoresis 4mg /ml Dexamethasone  PLAN FOR NEXT SESSION: posture, anterior cervical, lower trap strengthening, manual techniques,  modalities PRN    Loralyn Freshwater PT, DPT Physical Therapist - [facility]  [facility]  12/20/23, 3:33 PM

## 2023-12-20 NOTE — Progress Notes (Signed)
 BP 116/78   Pulse 73   Temp 98.7 F (37.1 C) (Oral)   Ht 6\' 2"  (1.88 m)   Wt (!) 338 lb (153.3 kg)   SpO2 98%   BMI 43.40 kg/m    Subjective:    Patient ID: Jesus Gardner, male    DOB: September 27, 1991, 33 y.o.   MRN: [id number]  HPI: Jesus Gardner is a 33 y.o. male  Chief Complaint  Patient presents with   Ear Fullness    Patient states he has been having fullness in his ear L ear. States he feels like there is some kind of fluid in there. States he also has popping sensations in his R ear as well. States this has been going on for a while.    EAR FULLNESS Has been going on for some time. Had abx given on 06/11/23, symptoms went away a little bit, but the popping was still there.  Fullness is in left ear and right ear pops.  Has had sinus issues in the past.  Uses breath strips at night lately, which helped for a little while.  Grandmother has Meniere's Disease.   Duration: months Involved ear(s): bilateral Fever: no Otorrhea: no Upper respiratory infection symptoms: no Pruritus: no Hearing loss: no - occasionally feels like it goes out Water immersion no Using Q-tips: yes Recurrent otitis media: no Status: fluctuating Treatments attempted: abx therapy   Relevant past medical, surgical, family and social history reviewed and updated as indicated. Interim medical history since our last visit reviewed. Allergies and medications reviewed and updated.  Review of Systems  Constitutional:  Negative for activity change, diaphoresis, fatigue and fever.  HENT:  Negative for ear discharge, ear pain and hearing loss.   Respiratory:  Negative for cough, chest tightness, shortness of breath and wheezing.   Cardiovascular:  Negative for chest pain, palpitations and leg swelling.  Gastrointestinal: Negative.   Neurological: Negative.   Psychiatric/Behavioral: Negative.      Per HPI unless specifically indicated above     Objective:    BP 116/78   Pulse 73   Temp 98.7 F (37.1  C) (Oral)   Ht 6\' 2"  (1.88 m)   Wt (!) 338 lb (153.3 kg)   SpO2 98%   BMI 43.40 kg/m   Wt Readings from Last 3 Encounters:  12/20/23 (!) 338 lb (153.3 kg)  10/08/23 (!) 338 lb 3.2 oz (153.4 kg)  09/07/23 (!) 334 lb 9.6 oz (151.8 kg)    Physical Exam Vitals and nursing note reviewed.  Constitutional:      General: He is awake. He is not in acute distress.    Appearance: He is well-developed and well-groomed. He is obese. He is not ill-appearing or toxic-appearing.  HENT:     Head: Normocephalic.     Right Ear: Hearing, tympanic membrane, ear canal and external ear normal. There is no impacted cerumen. Tympanic membrane is not injected.     Left Ear: Hearing, tympanic membrane, ear canal and external ear normal. There is no impacted cerumen. Tympanic membrane is not injected.  Eyes:     General: Lids are normal.     Extraocular Movements: Extraocular movements intact.     Conjunctiva/sclera: Conjunctivae normal.  Neck:     Thyroid: No thyromegaly.     Vascular: No carotid bruit.  Cardiovascular:     Rate and Rhythm: Normal rate and regular rhythm.     Heart sounds: Normal heart sounds. No murmur heard.  No gallop.  Pulmonary:     Effort: No accessory muscle usage or respiratory distress.     Breath sounds: Normal breath sounds.  Abdominal:     General: Bowel sounds are normal. There is no distension.     Palpations: Abdomen is soft.     Tenderness: There is no abdominal tenderness.  Musculoskeletal:     Cervical back: Full passive range of motion without pain.     Right lower leg: No edema.     Left lower leg: No edema.  Lymphadenopathy:     Cervical: No cervical adenopathy.  Skin:    General: Skin is warm.     Capillary Refill: Capillary refill takes less than 2 seconds.  Neurological:     Mental Status: He is alert and oriented to person, place, and time.     Deep Tendon Reflexes: Reflexes are normal and symmetric.     Reflex Scores:      Brachioradialis reflexes  are 2+ on the right side and 2+ on the left side.      Patellar reflexes are 2+ on the right side and 2+ on the left side. Psychiatric:        Attention and Perception: Attention normal.        Mood and Affect: Mood normal.        Speech: Speech normal.        Behavior: Behavior normal. Behavior is cooperative.        Thought Content: Thought content normal.     Results for orders placed or performed in visit on 09/07/23  Comprehensive metabolic panel   Collection Time: 09/07/23 11:47 AM  Result Value Ref Range   Glucose 96 70 - 99 mg/dL   BUN 15 6 - 20 mg/dL   Creatinine, Ser 0.98 0.76 - 1.27 mg/dL   eGFR 119 >14 NW/GNF/6.21   BUN/Creatinine Ratio 19 9 - 20   Sodium 141 134 - 144 mmol/L   Potassium 4.4 3.5 - 5.2 mmol/L   Chloride 104 96 - 106 mmol/L   CO2 21 20 - 29 mmol/L   Calcium 9.3 8.7 - 10.2 mg/dL   Total Protein 6.9 6.0 - 8.5 g/dL   Albumin 4.4 4.1 - 5.1 g/dL   Globulin, Total 2.5 1.5 - 4.5 g/dL   Bilirubin Total 0.3 0.0 - 1.2 mg/dL   Alkaline Phosphatase 59 44 - 121 IU/L   AST 21 0 - 40 IU/L   ALT 35 0 - 44 IU/L  CBC with Differential/Platelet   Collection Time: 09/07/23 11:47 AM  Result Value Ref Range   WBC 5.6 3.4 - 10.8 x10E3/uL   RBC 5.07 4.14 - 5.80 x10E6/uL   Hemoglobin 14.3 13.0 - 17.7 g/dL   Hematocrit 30.8 65.7 - 51.0 %   MCV 88 79 - 97 fL   MCH 28.2 26.6 - 33.0 pg   MCHC 32.1 31.5 - 35.7 g/dL   RDW 84.6 96.2 - 95.2 %   Platelets 265 150 - 450 x10E3/uL   Neutrophils 64 Not Estab. %   Lymphs 25 Not Estab. %   Monocytes 8 Not Estab. %   Eos 2 Not Estab. %   Basos 1 Not Estab. %   Neutrophils Absolute 3.6 1.4 - 7.0 x10E3/uL   Lymphocytes Absolute 1.4 0.7 - 3.1 x10E3/uL   Monocytes Absolute 0.5 0.1 - 0.9 x10E3/uL   EOS (ABSOLUTE) 0.1 0.0 - 0.4 x10E3/uL   Basophils Absolute 0.0 0.0 - 0.2 x10E3/uL   Immature Granulocytes 0  Not Estab. %   Immature Grans (Abs) 0.0 0.0 - 0.1 x10E3/uL  TSH   Collection Time: 09/07/23 11:47 AM  Result Value Ref Range    TSH 1.260 0.450 - 4.500 uIU/mL  ANA 12 Plus Profile (RDL)   Collection Time: 09/07/23 11:47 AM  Result Value Ref Range   Anti-Nuclear Ab by IFA (RDL) Positive (A) Negative  C-reactive protein   Collection Time: 09/07/23 11:47 AM  Result Value Ref Range   CRP 4 0 - 10 mg/L  Sedimentation rate   Collection Time: 09/07/23 11:47 AM  Result Value Ref Range   Sed Rate 26 (H) 0 - 15 mm/hr  Insulin, random   Collection Time: 09/07/23 11:47 AM  Result Value Ref Range   INSULIN 10.4 2.6 - 24.9 uIU/mL  ANA 12 Plus Profile, Positive   Collection Time: 09/07/23 11:47 AM  Result Value Ref Range   Homogeneous Pattern 1:160 (H) <1:40   Speckled Pattern 1:160 (H) <1:40   Note: Comment    Anti-Centromere Ab (RDL) <1:40 <1:40   Anti-dsDNA Ab by Farr(RDL) <8.0 <8.0 IU/mL   Anti-Sm Ab (RDL) <20 <20 Units   Anti-U1 RNP Ab (RDL) <20 <20 Units   Anti-Ro (SS-A) Ab (RDL) <20 <20 Units   Anti-La (SS-B) Ab (RDL) <20 <20 Units   Anti-Scl-70 Ab (RDL) <20 <20 Units   Anti-Cardiolipin Ab, IgG (RDL) <15 <15 GPL U/mL   Anti-Cardiolipin Ab, IgA (RDL) <12 <12 APL U/mL   Anti-Cardiolipin Ab, IgM (RDL) <13 <13 MPL U/mL   C3 Complement (RDL) 180 (H) 82 - 167 mg/dL   C4 Complement (RDL) 34 14 - 44 mg/dL   Anti-TPO Ab (RDL) <1.6 <9.0 IU/mL   Anti-Chromatin Ab, IgG (RDL) <20 <20 Units   Anti-CCP Ab, IgG & IgA (RDL) <20 <20 Units   Rheumatoid Factor by Turb RDL <14 <14 IU/mL   ANA Plus 12 Interpretation Comment       Assessment & Plan:   Problem List Items Addressed This Visit       Nervous and Auditory   Ear fullness, bilateral - Primary   Ongoing for long while.  Overall reassuring exam, some fluid behind TM but continues to take allergy medication.  Continue Allegra D and Flonase daily.  Will get into ENT for further evaluation.  Can not take steroids, cause palpitations.      Relevant Orders   Ambulatory referral to ENT     Follow up plan: Return if symptoms worsen or fail to  improve.

## 2023-12-20 NOTE — Assessment & Plan Note (Signed)
 Ongoing for long while.  Overall reassuring exam, some fluid behind TM but continues to take allergy medication.  Continue Allegra D and Flonase daily.  Will get into ENT for further evaluation.  Can not take steroids, cause palpitations.

## 2023-12-22 ENCOUNTER — Ambulatory Visit: Payer: BC Managed Care – PPO

## 2023-12-27 ENCOUNTER — Telehealth: Payer: Self-pay

## 2023-12-27 ENCOUNTER — Ambulatory Visit: Payer: BC Managed Care – PPO

## 2023-12-27 NOTE — Telephone Encounter (Signed)
 No show. Called patient and left a message pertaining to appointment and a reminder for the next follow up session. Return phone call requested. Phone number [phone]) provided.

## 2023-12-29 ENCOUNTER — Ambulatory Visit: Payer: BC Managed Care – PPO

## 2023-12-29 DIAGNOSIS — M542 Cervicalgia: Secondary | ICD-10-CM | POA: Diagnosis not present

## 2023-12-29 NOTE — Therapy (Signed)
 OUTPATIENT PHYSICAL THERAPY TREATMENT   Patient Name: Jesus Gardner MRN: [id number] DOB:[birth year], 33 y.o., male Today's Date: 12/29/2023  END OF SESSION:  PT End of Session - 12/29/23 1350     Visit Number 12    Number of Visits 17    Date for PT Re-Evaluation 01/07/24    PT Start Time 1350    PT Stop Time 1431    PT Time Calculation (min) 41 min    Activity Tolerance Patient tolerated treatment well    Behavior During Therapy [facility] for tasks assessed/performed                       Past Medical History:  Diagnosis Date   Allergy    Costochondritis 12/30/2015   Depression    Elevated transaminase level 03/12/2016   GERD (gastroesophageal reflux disease)    History of chicken pox    Past Surgical History:  Procedure Laterality Date   COLONOSCOPY WITH PROPOFOL N/A 08/20/2020   Procedure: COLONOSCOPY WITH PROPOFOL;  Surgeon: Midge Minium, MD;  Location: [facility] ENDOSCOPY;  Service: Endoscopy;  Laterality: N/A;   None     Patient Active Problem List   Diagnosis Date Noted   Ear fullness, bilateral 12/20/2023   Neck pain 09/07/2023   Intractable migraine with aura without status migrainosus 01/17/2023   BMI 40.0-44.9, adult (HCC) 01/17/2023   Diverticulosis 03/21/2019   OSA (obstructive sleep apnea) 12/06/2018   Allergic rhinitis 12/30/2015   Motion sickness 12/30/2015   Obesity 12/30/2015   Hyperlipidemia, mixed 03/06/2009   Fam hx-ischem heart disease 10/12/1998    PCP: Marjie Skiff, NP  REFERRING PROVIDER: Marjie Skiff, NP  REFERRING DIAG: M54.2 (ICD-10-CM) - Neck pain  THERAPY DIAG:  Cervicalgia  Rationale for Evaluation and Treatment: Rehabilitation  ONSET DATE: 2020  SUBJECTIVE:                                                                                                                                                                                                         SUBJECTIVE STATEMENT:  Can tell he has missed some  appointments. Has had posterior headaches at the base of his neck (suboccipital area) L upper trap is tight currently.     Hand dominance: Right  PERTINENT HISTORY:  Neck pain. Pain began around 2020. Had L upper trap and L lateral neck pain. At one time around 2020, pt woke up one day with L UE and LE pain. Went to the ER, pt did not have a CVA, or a hear attack.  Pt was given muscle relaxer which did not help. Could not hold things with L hand and pt would drop things. Had massage therapy L upper trap area which helped. Currently has neck and B upper trap and B medial shoulder blade pain (scalene muscle referral pattern). Gets B posterior neck muscle tightness which causes cervicogenic headache.    No latex allergies  PAIN:  Are you having pain? Yes: NPRS scale: /10 Pain location: Neck (posterior and B upper trap and B medial scapular area) Pain description: tight, sharp at times, constant dull ache Aggravating factors: heat, turning his head, looking up, extraneous activity such as yard work, lifting weights  Relieving factors: looking down, sitting with his hands supporting his head, sleep, massage, ice  PRECAUTIONS: No known precautions  RED FLAGS: Bowel or bladder incontinence: No and Cauda equina syndrome: No     WEIGHT BEARING RESTRICTIONS: No  FALLS:  Has patient fallen in last 6 months? 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   PLOF: Independent  PATIENT GOALS: pain relief  NEXT MD VISIT: 04/12/2024  OBJECTIVE:  Note: Objective measures were completed at Evaluation unless otherwise noted.  DIAGNOSTIC FINDINGS:  DG Cervical Spine Complete 09/30/2024 Narrative & Impression  CLINICAL DATA:  Chronic neck pain   EXAM: CERVICAL SPINE - COMPLETE 4+ VIEW   COMPARISON:  03/29/2019   FINDINGS: There is no evidence of cervical spine fracture or prevertebral soft tissue swelling. Alignment is normal. No other significant bone abnormalities  are identified.   IMPRESSION: Negative cervical spine radiographs.     Electronically Signed   By: Judie Petit.  Shick M.D.   On: 10/10/2023 15:50            PATIENT SURVEYS:  NDI 17 (11/08/2023)  COGNITION: Overall cognitive status: Within functional limits for tasks assessed  SENSATION: WFL  POSTURE: cervical protraction, movement preference around C5-C6 area, B protracted shoulders, L lateral shift.  PALPATION: Cervical paraspinal muscle tension, B upper trap muscle tension R > L, decreased caudal glide R and L first rib  Decreased CPA mid to upper thoracic spine with reproduction of symptoims.     CERVICAL ROM:   Active ROM A/PROM (deg) eval  Flexion Full with B cervical paraspinal muscle pulling  Extension Limited with L medial scapular pulling and pain, and cervical spine pain.   Right lateral flexion WFL with L lateral neck tightness  Left lateral flexion WFL with R lateral neck tightness  Right rotation 65 with neck tightness  Left rotation 73 with neck tightness   (Blank rows = not tested)  UPPER EXTREMITY ROM:  Active ROM Right eval Left eval  Shoulder flexion Limited with medial scapular pain Limited with medial scapular pain  Shoulder extension    Shoulder abduction    Shoulder adduction    Shoulder extension    Shoulder internal rotation    Shoulder external rotation    Elbow flexion    Elbow extension    Wrist flexion    Wrist extension    Wrist ulnar deviation    Wrist radial deviation    Wrist pronation    Wrist supination     (Blank rows = not tested)  UPPER EXTREMITY MMT:  MMT Right eval Left eval  Shoulder flexion 4 4  Shoulder extension    Shoulder abduction 4 (with R lateral shoulder and arm pain) 4- with L shoulder pull  Shoulder adduction    Shoulder extension    Shoulder internal rotation 4-  4-  Shoulder external rotation 4 with R lateral shoulder pain 4-  Middle trapezius 4- (with R lateral arm pain) 4-  Lower trapezius  4- (with R lateral arm pain) 3+  Elbow flexion    Elbow extension    Wrist flexion    Wrist extension    Wrist ulnar deviation    Wrist radial deviation    Wrist pronation    Wrist supination    Grip strength     (Blank rows = not tested)  CERVICAL SPECIAL TESTS:   VBI testing: positive R side with pt reports of tunnel vision  Negative for L side. Pt was recommended to contact his PCP. Pt verbalized understanding.    FUNCTIONAL TESTS:    TREATMENT DATE: 12/29/23                                                                                                  Manual Therapy:   Seated STM B cervical paraspinal muscles to decrease tension   Decreased posterior suboccipital discomfort afterwards reported    TherEx:   SNAGS to C2 TP   With L cervical rotation 10x3  With R cervical rotation 10x3  Supine with towel roll behind thoracic kyphosis  Cervical nod 10x10 seconds    B scapular retraction with B shoulder flexion to 90 degrees 10x   Then with 5 lbs each hand (total 10 lbs) 10x2  Seated press-up 10x2 with 5 second holds  difficult   Improved exercise technique, movement at target joints, use of target muscles after mod verbal, visual, tactile cues.          PATIENT EDUCATION:  Education details: There-ex, HEP Person educated: Patient Education method: Explanation, Demonstration, Tactile cues, Verbal cues, and Handouts Education comprehension: verbalized understanding and returned demonstration  HOME EXERCISE PROGRAM: Access Code: [email or web address] URL: [email or web address] Date: 11/10/2023 Prepared by: Loralyn Freshwater  Exercises - Shoulder extension with resistance - Neutral  - 1 x daily - 7 x weekly - 2 sets - 10 reps - 5 seconds hold  Green band   Discontinued on 11/17/2023   Access Code: [email or web address] URL: [email or web address] Date: 11/17/2023 Prepared by: Loralyn Freshwater  Exercises - Supine Head Nod Deep Neck Flexor Training  -  3 x daily - 7 x weekly - 3 sets - 10 reps - 5 seconds hold  - Supine Scapular Retraction  - 1 x daily - 7 x weekly - 3 sets - 10 reps - 5 seconds hold - Supine Shoulder Horizontal Abduction with Dumbbells  - 3 x daily - 7 x weekly - 3 sets - 10 reps - 5 seconds hold - First Rib Mobilization with Strap  - 3 x daily - 7 x weekly - 1 sets - 5 reps - 30 seconds hold - Seated Thoracic Lumbar Extension  - 1 x daily - 7 x weekly - 3 sets - 10 reps - 5 seconds hold  - Seated Assisted Cervical Rotation with Towel  - 1 x daily - 7 x weekly - 3 sets - 10 reps   ASSESSMENT:  CLINICAL IMPRESSION:  Continued working  on improving anterior cervical and scapular depressor muscle strengthening to help decrease B cervical paraspinal and upper trap muscle tension. Continued working on cervical mobility to help decrease suboccipital headaches. Neck feels better after session reported. Pt will benefit from continued skilled physical therapy services to decrease pain, improve strength and function.          OBJECTIVE IMPAIRMENTS: decreased ROM, decreased strength, improper body mechanics, postural dysfunction, and pain.   ACTIVITY LIMITATIONS: carrying, lifting, and reach over head  PARTICIPATION LIMITATIONS:   PERSONAL FACTORS: Fitness, Profession, Time since onset of injury/illness/exacerbation, and 1 comorbidity: depression  are also affecting patient's functional outcome.   REHAB POTENTIAL: Fair    CLINICAL DECISION MAKING: Stable/uncomplicated  EVALUATION COMPLEXITY: Low   GOALS: Goals reviewed with patient? Yes  SHORT TERM GOALS: Target date: 11/19/2023  Pt will be independent with his initial HEP to decrease pain, improve strength, function, ability to look around as well as lift without neck pain.  Baseline: Pt has not yet started his initial HEP (11/08/2023); No questions with his HEP, Able to perform them (12/15/2023)  Goal status: MET    LONG TERM GOALS: Target date: 01/07/2024  Pt  will have a decrease in neck pain to 3/10 or less at worst to promote ability to look around, lift, perform yard work as well as work out at [employer] more comfortably for his neck.  Baseline: 7/10 at most for the past 3 months (11/08/2023); 4/10 neck and upper trap pain at most for the past 7 days (12/15/2023) Goal status: Progressing  2.  Pt will improve B lower, middle trap, ER, IR strength by at least 1/2 MMT grade to promote ability to look around, and lift with less neck pain.  Baseline:  MMT Right eval Left eval R (12/15/2023) L  (12/15/2023)  Shoulder internal rotation 4- 4- 4+ 4+  Shoulder external rotation 4 with R lateral shoulder pain 4- 4 with R lateral shoulder pain 4  Middle trapezius 4- (with R lateral arm pain) 4- 4 (with R lateral arm pain) 4  Lower trapezius 4- (with R lateral arm pain) 3+ 4- (with R lateral arm pain) 4   Goal status: Partially met  3.  Pt will have a decrease in Neck Disability Index (NDI) score by at least 7 points as a demonstration of improved function.  Baseline: NDI score of 17 suggesting moderate disability (11/08/2023) Goal status: Ongoing     PLAN:  PT FREQUENCY: 1-2x/week  PT DURATION: 8 weeks  PLANNED INTERVENTIONS: 97110-Therapeutic exercises, 97530- Therapeutic activity, 97112- Neuromuscular re-education, 97535- Self Care, 69629- Manual therapy, 97014- Electrical stimulation (unattended), and 97033- Ionotophoresis 4mg /ml Dexamethasone  PLAN FOR NEXT SESSION: posture, anterior cervical, lower trap strengthening, manual techniques, modalities PRN    Loralyn Freshwater PT, DPT Physical Therapist - [facility]  [facility]  12/29/23, 3:33 PM

## 2023-12-30 ENCOUNTER — Ambulatory Visit: Payer: BC Managed Care – PPO | Admitting: Nurse Practitioner

## 2024-01-03 ENCOUNTER — Ambulatory Visit: Payer: BC Managed Care – PPO

## 2024-01-03 DIAGNOSIS — M542 Cervicalgia: Secondary | ICD-10-CM | POA: Diagnosis not present

## 2024-01-03 NOTE — Therapy (Signed)
 OUTPATIENT PHYSICAL THERAPY TREATMENT   Patient Name: Jesus Gardner MRN: [id number] DOB:1991-03-28, 33 y.o., male Today's Date: 01/03/2024  END OF SESSION:  PT End of Session - 01/03/24 1033     Visit Number 13    Number of Visits 17    Date for PT Re-Evaluation 01/07/24    PT Start Time 1033    PT Stop Time 1116    PT Time Calculation (min) 43 min    Activity Tolerance Patient tolerated treatment well    Behavior During Therapy [facility] for tasks assessed/performed                        Past Medical History:  Diagnosis Date   Allergy    Costochondritis 12/30/2015   Depression    Elevated transaminase level 03/12/2016   GERD (gastroesophageal reflux disease)    History of chicken pox    Past Surgical History:  Procedure Laterality Date   COLONOSCOPY WITH PROPOFOL N/A 08/20/2020   Procedure: COLONOSCOPY WITH PROPOFOL;  Surgeon: Midge Minium, MD;  Location: [facility] ENDOSCOPY;  Service: Endoscopy;  Laterality: N/A;   None     Patient Active Problem List   Diagnosis Date Noted   Ear fullness, bilateral 12/20/2023   Neck pain 09/07/2023   Intractable migraine with aura without status migrainosus 01/17/2023   BMI 40.0-44.9, adult (HCC) 01/17/2023   Diverticulosis 03/21/2019   OSA (obstructive sleep apnea) 12/06/2018   Allergic rhinitis 12/30/2015   Motion sickness 12/30/2015   Obesity 12/30/2015   Hyperlipidemia, mixed 03/06/2009   Fam hx-ischem heart disease 10/12/1998    PCP: Marjie Skiff, NP  REFERRING PROVIDER: Marjie Skiff, NP  REFERRING DIAG: M54.2 (ICD-10-CM) - Neck pain  THERAPY DIAG:  Cervicalgia  Rationale for Evaluation and Treatment: Rehabilitation  ONSET DATE: 2020  SUBJECTIVE:                                                                                                                                                                                                         SUBJECTIVE STATEMENT:  Neck is doing ok. Has not had  a posterior headache since last session. Put out about 150 bags of much this weekend. Neck was stiff but not in pain afterwards.       Hand dominance: Right  PERTINENT HISTORY:  Neck pain. Pain began around 2020. Had L upper trap and L lateral neck pain. At one time around 2020, pt woke up one day with L UE and LE pain. Went to the ER, pt did  not have a CVA, or a hear attack. Pt was given muscle relaxer which did not help. Could not hold things with L hand and pt would drop things. Had massage therapy L upper trap area which helped. Currently has neck and B upper trap and B medial shoulder blade pain (scalene muscle referral pattern). Gets B posterior neck muscle tightness which causes cervicogenic headache.    No latex allergies  PAIN:  Are you having pain? Yes: NPRS scale: /10 Pain location: Neck (posterior and B upper trap and B medial scapular area) Pain description: tight, sharp at times, constant dull ache Aggravating factors: heat, turning his head, looking up, extraneous activity such as yard work, lifting weights  Relieving factors: looking down, sitting with his hands supporting his head, sleep, massage, ice  PRECAUTIONS: No known precautions  RED FLAGS: Bowel or bladder incontinence: No and Cauda equina syndrome: No     WEIGHT BEARING RESTRICTIONS: No  FALLS:  Has patient fallen in last 6 months? 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   PLOF: Independent  PATIENT GOALS: pain relief  NEXT MD VISIT: 04/12/2024  OBJECTIVE:  Note: Objective measures were completed at Evaluation unless otherwise noted.  DIAGNOSTIC FINDINGS:  DG Cervical Spine Complete 09/30/2024 Narrative & Impression  CLINICAL DATA:  Chronic neck pain   EXAM: CERVICAL SPINE - COMPLETE 4+ VIEW   COMPARISON:  03/29/2019   FINDINGS: There is no evidence of cervical spine fracture or prevertebral soft tissue swelling. Alignment is normal. No other significant  bone abnormalities are identified.   IMPRESSION: Negative cervical spine radiographs.     Electronically Signed   By: Judie Petit.  Shick M.D.   On: 10/10/2023 15:50            PATIENT SURVEYS:  NDI 17 (11/08/2023)  COGNITION: Overall cognitive status: Within functional limits for tasks assessed  SENSATION: WFL  POSTURE: cervical protraction, movement preference around C5-C6 area, B protracted shoulders, L lateral shift.  PALPATION: Cervical paraspinal muscle tension, B upper trap muscle tension R > L, decreased caudal glide R and L first rib  Decreased CPA mid to upper thoracic spine with reproduction of symptoims.     CERVICAL ROM:   Active ROM A/PROM (deg) eval  Flexion Full with B cervical paraspinal muscle pulling  Extension Limited with L medial scapular pulling and pain, and cervical spine pain.   Right lateral flexion WFL with L lateral neck tightness  Left lateral flexion WFL with R lateral neck tightness  Right rotation 65 with neck tightness  Left rotation 73 with neck tightness   (Blank rows = not tested)  UPPER EXTREMITY ROM:  Active ROM Right eval Left eval  Shoulder flexion Limited with medial scapular pain Limited with medial scapular pain  Shoulder extension    Shoulder abduction    Shoulder adduction    Shoulder extension    Shoulder internal rotation    Shoulder external rotation    Elbow flexion    Elbow extension    Wrist flexion    Wrist extension    Wrist ulnar deviation    Wrist radial deviation    Wrist pronation    Wrist supination     (Blank rows = not tested)  UPPER EXTREMITY MMT:  MMT Right eval Left eval  Shoulder flexion 4 4  Shoulder extension    Shoulder abduction 4 (with R lateral shoulder and arm pain) 4- with L shoulder pull  Shoulder adduction    Shoulder  extension    Shoulder internal rotation 4- 4-  Shoulder external rotation 4 with R lateral shoulder pain 4-  Middle trapezius 4- (with R lateral arm pain)  4-  Lower trapezius 4- (with R lateral arm pain) 3+  Elbow flexion    Elbow extension    Wrist flexion    Wrist extension    Wrist ulnar deviation    Wrist radial deviation    Wrist pronation    Wrist supination    Grip strength     (Blank rows = not tested)  CERVICAL SPECIAL TESTS:   VBI testing: positive R side with pt reports of tunnel vision  Negative for L side. Pt was recommended to contact his PCP. Pt verbalized understanding.    FUNCTIONAL TESTS:    TREATMENT DATE: 01/03/24                                                                                                    TherEx:   SNAGS to C2 TP   With L cervical rotation 10x2  With R cervical rotation 10x2   Seated press-up 10x3 with 5 second holds   Supine with towel roll behind thoracic kyphosis  Cervical nod 10x10 seconds    B scapular retraction with B shoulder flexion to 90 degrees 10x5 seconds     Then with 5 lbs each hand (total 10 lbs) 10x3    Improved exercise technique, movement at target joints, use of target muscles after mod verbal, visual, tactile cues.     Manual Therapy:    Seated STM B cervical paraspinal muscles to decrease tension   Decreased posterior suboccipital discomfort afterwards reported      PATIENT EDUCATION:  Education details: There-ex, HEP Person educated: Patient Education method: Explanation, Demonstration, Tactile cues, Verbal cues, and Handouts Education comprehension: verbalized understanding and returned demonstration  HOME EXERCISE PROGRAM: Access Code: [email or web address] URL: [email or web address] Date: 11/10/2023 Prepared by: Loralyn Freshwater  Exercises - Shoulder extension with resistance - Neutral  - 1 x daily - 7 x weekly - 2 sets - 10 reps - 5 seconds hold  Green band   Discontinued on 11/17/2023   Access Code: [email or web address] URL: [email or web address] Date: 11/17/2023 Prepared by: Loralyn Freshwater  Exercises - Supine Head Nod  Deep Neck Flexor Training  - 3 x daily - 7 x weekly - 3 sets - 10 reps - 5 seconds hold  - Supine Scapular Retraction  - 1 x daily - 7 x weekly - 3 sets - 10 reps - 5 seconds hold - Supine Shoulder Horizontal Abduction with Dumbbells  - 3 x daily - 7 x weekly - 3 sets - 10 reps - 5 seconds hold - First Rib Mobilization with Strap  - 3 x daily - 7 x weekly - 1 sets - 5 reps - 30 seconds hold - Seated Thoracic Lumbar Extension  - 1 x daily - 7 x weekly - 3 sets - 10 reps - 5 seconds hold  - Seated Assisted Cervical Rotation with Towel  - 1 x daily - 7 x weekly - 3 sets -  10 reps   ASSESSMENT:  CLINICAL IMPRESSION:  Improved ability to lift heavy items such as mulch without neck pain based on subjective reports this past weekend. Continued working on improving anterior cervical and scapular depressor muscle strengthening to help decrease B cervical paraspinal and upper trap muscle tension. No neck pain reported after session Pt will benefit from continued skilled physical therapy services to decrease pain, improve strength and function.          OBJECTIVE IMPAIRMENTS: decreased ROM, decreased strength, improper body mechanics, postural dysfunction, and pain.   ACTIVITY LIMITATIONS: carrying, lifting, and reach over head  PARTICIPATION LIMITATIONS:   PERSONAL FACTORS: Fitness, Profession, Time since onset of injury/illness/exacerbation, and 1 comorbidity: depression  are also affecting patient's functional outcome.   REHAB POTENTIAL: Fair    CLINICAL DECISION MAKING: Stable/uncomplicated  EVALUATION COMPLEXITY: Low   GOALS: Goals reviewed with patient? Yes  SHORT TERM GOALS: Target date: 11/19/2023  Pt will be independent with his initial HEP to decrease pain, improve strength, function, ability to look around as well as lift without neck pain.  Baseline: Pt has not yet started his initial HEP (11/08/2023); No questions with his HEP, Able to perform them (12/15/2023)  Goal status:  MET    LONG TERM GOALS: Target date: 01/07/2024  Pt will have a decrease in neck pain to 3/10 or less at worst to promote ability to look around, lift, perform yard work as well as work out at [employer] more comfortably for his neck.  Baseline: 7/10 at most for the past 3 months (11/08/2023); 4/10 neck and upper trap pain at most for the past 7 days (12/15/2023) Goal status: Progressing  2.  Pt will improve B lower, middle trap, ER, IR strength by at least 1/2 MMT grade to promote ability to look around, and lift with less neck pain.  Baseline:  MMT Right eval Left eval R (12/15/2023) L  (12/15/2023)  Shoulder internal rotation 4- 4- 4+ 4+  Shoulder external rotation 4 with R lateral shoulder pain 4- 4 with R lateral shoulder pain 4  Middle trapezius 4- (with R lateral arm pain) 4- 4 (with R lateral arm pain) 4  Lower trapezius 4- (with R lateral arm pain) 3+ 4- (with R lateral arm pain) 4   Goal status: Partially met  3.  Pt will have a decrease in Neck Disability Index (NDI) score by at least 7 points as a demonstration of improved function.  Baseline: NDI score of 17 suggesting moderate disability (11/08/2023) Goal status: Ongoing     PLAN:  PT FREQUENCY: 1-2x/week  PT DURATION: 8 weeks  PLANNED INTERVENTIONS: 97110-Therapeutic exercises, 97530- Therapeutic activity, 97112- Neuromuscular re-education, 97535- Self Care, 96045- Manual therapy, 97014- Electrical stimulation (unattended), and 97033- Ionotophoresis 4mg /ml Dexamethasone  PLAN FOR NEXT SESSION: posture, anterior cervical, lower trap strengthening, manual techniques, modalities PRN    Loralyn Freshwater PT, DPT Physical Therapist - [facility]  01/03/24, 12:21 PM

## 2024-01-05 ENCOUNTER — Ambulatory Visit: Payer: BC Managed Care – PPO

## 2024-01-05 DIAGNOSIS — M542 Cervicalgia: Secondary | ICD-10-CM

## 2024-01-05 NOTE — Therapy (Signed)
 OUTPATIENT PHYSICAL THERAPY TREATMENT   Patient Name: Jesus Gardner MRN: [id number] DOB:Sep 04, 1991, 33 y.o., male Today's Date: 01/05/2024  END OF SESSION:  PT End of Session - 01/05/24 1434     Visit Number 14    Number of Visits 17    Date for PT Re-Evaluation 01/07/24    PT Start Time 1434    PT Stop Time 1522    PT Time Calculation (min) 48 min    Activity Tolerance Patient tolerated treatment well    Behavior During Therapy [facility] for tasks assessed/performed                         Past Medical History:  Diagnosis Date   Allergy    Costochondritis 12/30/2015   Depression    Elevated transaminase level 03/12/2016   GERD (gastroesophageal reflux disease)    History of chicken pox    Past Surgical History:  Procedure Laterality Date   COLONOSCOPY WITH PROPOFOL N/A 08/20/2020   Procedure: COLONOSCOPY WITH PROPOFOL;  Surgeon: Midge Minium, MD;  Location: [facility] ENDOSCOPY;  Service: Endoscopy;  Laterality: N/A;   None     Patient Active Problem List   Diagnosis Date Noted   Ear fullness, bilateral 12/20/2023   Neck pain 09/07/2023   Intractable migraine with aura without status migrainosus 01/17/2023   BMI 40.0-44.9, adult (HCC) 01/17/2023   Diverticulosis 03/21/2019   OSA (obstructive sleep apnea) 12/06/2018   Allergic rhinitis 12/30/2015   Motion sickness 12/30/2015   Obesity 12/30/2015   Hyperlipidemia, mixed 03/06/2009   Fam hx-ischem heart disease 10/12/1998    PCP: Marjie Skiff, NP  REFERRING PROVIDER: Marjie Skiff, NP  REFERRING DIAG: M54.2 (ICD-10-CM) - Neck pain  THERAPY DIAG:  Cervicalgia  Rationale for Evaluation and Treatment: Rehabilitation  ONSET DATE: 2020  SUBJECTIVE:                                                                                                                                                                                                         SUBJECTIVE STATEMENT:  Neck is doing ok. A little  stiff on the L side, can feel it on the L face (eye area)    Hand dominance: Right  PERTINENT HISTORY:  Neck pain. Pain began around 2020. Had L upper trap and L lateral neck pain. At one time around 2020, pt woke up one day with L UE and LE pain. Went to the ER, pt did not have a CVA, or a hear attack. Pt was given muscle  relaxer which did not help. Could not hold things with L hand and pt would drop things. Had massage therapy L upper trap area which helped. Currently has neck and B upper trap and B medial shoulder blade pain (scalene muscle referral pattern). Gets B posterior neck muscle tightness which causes cervicogenic headache.    No latex allergies  PAIN:  Are you having pain? Yes: NPRS scale: /10 Pain location: Neck (posterior and B upper trap and B medial scapular area) Pain description: tight, sharp at times, constant dull ache Aggravating factors: heat, turning his head, looking up, extraneous activity such as yard work, lifting weights  Relieving factors: looking down, sitting with his hands supporting his head, sleep, massage, ice  PRECAUTIONS: No known precautions  RED FLAGS: Bowel or bladder incontinence: No and Cauda equina syndrome: No     WEIGHT BEARING RESTRICTIONS: No  FALLS:  Has patient fallen in last 6 months? 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   PLOF: Independent  PATIENT GOALS: pain relief  NEXT MD VISIT: 04/12/2024  OBJECTIVE:  Note: Objective measures were completed at Evaluation unless otherwise noted.  DIAGNOSTIC FINDINGS:  DG Cervical Spine Complete 09/30/2024 Narrative & Impression  CLINICAL DATA:  Chronic neck pain   EXAM: CERVICAL SPINE - COMPLETE 4+ VIEW   COMPARISON:  03/29/2019   FINDINGS: There is no evidence of cervical spine fracture or prevertebral soft tissue swelling. Alignment is normal. No other significant bone abnormalities are identified.   IMPRESSION: Negative cervical spine  radiographs.     Electronically Signed   By: Judie Petit.  Shick M.D.   On: 10/10/2023 15:50            PATIENT SURVEYS:  NDI 17 (11/08/2023)  COGNITION: Overall cognitive status: Within functional limits for tasks assessed  SENSATION: WFL  POSTURE: cervical protraction, movement preference around C5-C6 area, B protracted shoulders, L lateral shift.  PALPATION: Cervical paraspinal muscle tension, B upper trap muscle tension R > L, decreased caudal glide R and L first rib  Decreased CPA mid to upper thoracic spine with reproduction of symptoims.     CERVICAL ROM:   Active ROM A/PROM (deg) eval  Flexion Full with B cervical paraspinal muscle pulling  Extension Limited with L medial scapular pulling and pain, and cervical spine pain.   Right lateral flexion WFL with L lateral neck tightness  Left lateral flexion WFL with R lateral neck tightness  Right rotation 65 with neck tightness  Left rotation 73 with neck tightness   (Blank rows = not tested)  UPPER EXTREMITY ROM:  Active ROM Right eval Left eval  Shoulder flexion Limited with medial scapular pain Limited with medial scapular pain  Shoulder extension    Shoulder abduction    Shoulder adduction    Shoulder extension    Shoulder internal rotation    Shoulder external rotation    Elbow flexion    Elbow extension    Wrist flexion    Wrist extension    Wrist ulnar deviation    Wrist radial deviation    Wrist pronation    Wrist supination     (Blank rows = not tested)  UPPER EXTREMITY MMT:  MMT Right eval Left eval  Shoulder flexion 4 4  Shoulder extension    Shoulder abduction 4 (with R lateral shoulder and arm pain) 4- with L shoulder pull  Shoulder adduction    Shoulder extension    Shoulder internal rotation 4- 4-  Shoulder external  rotation 4 with R lateral shoulder pain 4-  Middle trapezius 4- (with R lateral arm pain) 4-  Lower trapezius 4- (with R lateral arm pain) 3+  Elbow flexion    Elbow  extension    Wrist flexion    Wrist extension    Wrist ulnar deviation    Wrist radial deviation    Wrist pronation    Wrist supination    Grip strength     (Blank rows = not tested)  CERVICAL SPECIAL TESTS:   VBI testing: positive R side with pt reports of tunnel vision  Negative for L side. Pt was recommended to contact his PCP. Pt verbalized understanding.    FUNCTIONAL TESTS:    TREATMENT DATE: 01/05/24                                                                                                    TherEx:   SNAGS to C2 TP  With R cervical rotation 10x4  Decreased L neck stiffness and L face discomfort  With L cervical rotation 10x2  Seated press-up 10x2 with 5 second holds    Supine with towel roll behind thoracic kyphosis   B shoulder extension with scapular retraction green band 10x5 seconds for 2 sets   B scapular retraction with B shoulder flexion to 90 degrees 10x5 seconds     Then with 5 lbs each hand (total 10 lbs) 10x3  Supine suboccipital release   No change in symptoms  Supine deep cervical flexion 10x2  Increased L face discomfort afterwards.   Seated cervical extension 5x5 seconds.   Seated chin tucks 5x, then 10x  Slight decrease in L face symptoms    Improved exercise technique, movement at target joints, use of target muscles after mod verbal, visual, tactile cues.           PATIENT EDUCATION:  Education details: There-ex, HEP Person educated: Patient Education method: Explanation, Demonstration, Tactile cues, Verbal cues, and Handouts Education comprehension: verbalized understanding and returned demonstration  HOME EXERCISE PROGRAM: Access Code: [email or web address] URL: [email or web address] Date: 11/10/2023 Prepared by: Loralyn Freshwater  Exercises - Shoulder extension with resistance - Neutral  - 1 x daily - 7 x weekly - 2 sets - 10 reps - 5 seconds hold  Green band   Discontinued on 11/17/2023   Access Code:  [email or web address] URL: [email or web address] Date: 11/17/2023 Prepared by: Loralyn Freshwater  Exercises - Supine Head Nod Deep Neck Flexor Training  - 3 x daily - 7 x weekly - 3 sets - 10 reps - 5 seconds hold  - Supine Scapular Retraction  - 1 x daily - 7 x weekly - 3 sets - 10 reps - 5 seconds hold - Supine Shoulder Horizontal Abduction with Dumbbells  - 3 x daily - 7 x weekly - 3 sets - 10 reps - 5 seconds hold - First Rib Mobilization with Strap  - 3 x daily - 7 x weekly - 1 sets - 5 reps - 30 seconds hold - Seated Thoracic Lumbar Extension  - 1 x daily - 7 x weekly - 3  sets - 10 reps - 5 seconds hold  - Seated Assisted Cervical Rotation with Towel  - 1 x daily - 7 x weekly - 3 sets - 10 reps   ASSESSMENT:  CLINICAL IMPRESSION:   Continued working on cervical mobility, thoracic extension, anterior cervical and scapular depressor strength to help decrease neck symptoms. Decreased neck stiffness reported after session. Slight decrease in L face (eye area) symptoms with chin tuck. Pt will benefit from continued skilled physical therapy services to decrease pain, improve strength and function.          OBJECTIVE IMPAIRMENTS: decreased ROM, decreased strength, improper body mechanics, postural dysfunction, and pain.   ACTIVITY LIMITATIONS: carrying, lifting, and reach over head  PARTICIPATION LIMITATIONS:   PERSONAL FACTORS: Fitness, Profession, Time since onset of injury/illness/exacerbation, and 1 comorbidity: depression  are also affecting patient's functional outcome.   REHAB POTENTIAL: Fair    CLINICAL DECISION MAKING: Stable/uncomplicated  EVALUATION COMPLEXITY: Low   GOALS: Goals reviewed with patient? Yes  SHORT TERM GOALS: Target date: 11/19/2023  Pt will be independent with his initial HEP to decrease pain, improve strength, function, ability to look around as well as lift without neck pain.  Baseline: Pt has not yet started his initial HEP (11/08/2023); No  questions with his HEP, Able to perform them (12/15/2023)  Goal status: MET    LONG TERM GOALS: Target date: 01/07/2024  Pt will have a decrease in neck pain to 3/10 or less at worst to promote ability to look around, lift, perform yard work as well as work out at [employer] more comfortably for his neck.  Baseline: 7/10 at most for the past 3 months (11/08/2023); 4/10 neck and upper trap pain at most for the past 7 days (12/15/2023); 2/10 at most for the past 7 days (01/05/2024) Goal status: MET  2.  Pt will improve B lower, middle trap, ER, IR strength by at least 1/2 MMT grade to promote ability to look around, and lift with less neck pain.  Baseline:  MMT Right eval Left eval R (12/15/2023) L  (12/15/2023)  Shoulder internal rotation 4- 4- 4+ 4+  Shoulder external rotation 4 with R lateral shoulder pain 4- 4 with R lateral shoulder pain 4  Middle trapezius 4- (with R lateral arm pain) 4- 4 (with R lateral arm pain) 4  Lower trapezius 4- (with R lateral arm pain) 3+ 4- (with R lateral arm pain) 4   Goal status: Partially met  3.  Pt will have a decrease in Neck Disability Index (NDI) score by at least 7 points as a demonstration of improved function.  Baseline: NDI score of 17 suggesting moderate disability (11/08/2023) Goal status: Ongoing     PLAN:  PT FREQUENCY: 1-2x/week  PT DURATION: 8 weeks  PLANNED INTERVENTIONS: 97110-Therapeutic exercises, 97530- Therapeutic activity, 97112- Neuromuscular re-education, 97535- Self Care, 16109- Manual therapy, 97014- Electrical stimulation (unattended), and 97033- Ionotophoresis 4mg /ml Dexamethasone  PLAN FOR NEXT SESSION: posture, anterior cervical, lower trap strengthening, manual techniques, modalities PRN    Loralyn Freshwater PT, DPT Physical Therapist - [facility]  [facility]  01/05/24, 4:33 PM

## 2024-01-09 ENCOUNTER — Encounter: Payer: Self-pay | Admitting: Nurse Practitioner

## 2024-01-09 ENCOUNTER — Other Ambulatory Visit: Payer: Self-pay

## 2024-01-09 ENCOUNTER — Ambulatory Visit
Admission: EM | Admit: 2024-01-09 | Discharge: 2024-01-09 | Disposition: A | Discharge status: Home / Self Care | Attending: Emergency Medicine | Admitting: Emergency Medicine

## 2024-01-09 ENCOUNTER — Encounter: Payer: Self-pay | Admitting: Emergency Medicine

## 2024-01-09 DIAGNOSIS — R1011 Right upper quadrant pain: Secondary | ICD-10-CM | POA: Diagnosis not present

## 2024-01-09 NOTE — ED Triage Notes (Signed)
 Patient presents to [facility] for evaluation after he had RUQ abdominal pain last night that woke him up from sleep.  He was able to go back to sleep but woke up this morning feeling nauseous.  He got to moving around and had 1 episode of emesis.  Wife is an NP and she asked him to come in to have blood work done to check his liver.  She is concerned it will be missed if he waits to have blood work done tomorrow at his PCP.  Patient states PCP is very involved and would follow up on any concerns.  Denies any pain at this time, but has pressure of 2/10.

## 2024-01-09 NOTE — ED Provider Notes (Signed)
 Jesus Gardner    CSN: [id number] Arrival date & time: 01/09/24  1147      History   Chief Complaint Chief Complaint  Patient presents with   Abdominal Pain    HPI Jesus Gardner is a 33 y.o. male.   Patient presents for eval ration of right upper quadrant abdominal pain and nausea with vomiting beginning overnight.  Symptoms awakening him from sleep and have persisted since, fluctuating in intensity.  Initially described as a sharp pain, now feels like a pressure as if someone is pushing into the body.  Has not attempted to eat or drink since vomiting episode.  Last bowel movement this morning, described as normal, denies constipation or diarrhea.  Has abdominal bloating, not worse than baseline.  Endorses during time when pain was severe he had difficult passing gas, has been able to do so since.  Did attempt use of Tums as he endorses a greasy meal of fish and chips prior to bed.  Similar symptoms have occurred once before approximately 5 years ago after heavy consumption of alcohol in the prior days, lasted approximately 7 days and at that time liver enzymes were elevated, have decreased to normal value since  Past Medical History:  Diagnosis Date   Allergy    Costochondritis 12/30/2015   Depression    Elevated transaminase level 03/12/2016   GERD (gastroesophageal reflux disease)    History of chicken pox     Patient Active Problem List   Diagnosis Date Noted   Ear fullness, bilateral 12/20/2023   Neck pain 09/07/2023   Intractable migraine with aura without status migrainosus 01/17/2023   BMI 40.0-44.9, adult (HCC) 01/17/2023   Diverticulosis 03/21/2019   OSA (obstructive sleep apnea) 12/06/2018   Allergic rhinitis 12/30/2015   Motion sickness 12/30/2015   Obesity 12/30/2015   Hyperlipidemia, mixed 03/06/2009   Fam hx-ischem heart disease 10/12/1998    Past Surgical History:  Procedure Laterality Date   COLONOSCOPY WITH PROPOFOL N/A 08/20/2020    Procedure: COLONOSCOPY WITH PROPOFOL;  Surgeon: Midge Minium, MD;  Location: [facility] ENDOSCOPY;  Service: Endoscopy;  Laterality: N/A;   None         Home Medications    Prior to Admission medications   Medication Sig Start Date End Date Taking? Authorizing Provider  acetaminophen (TYLENOL) 325 MG tablet Take 650 mg by mouth every 6 (six) hours as needed. PRN    [provider]  ALPRAZolam Prudy Feeler) 0.5 MG tablet One tablet at least 30 minutes before flying, and every four hours as needed 09/15/21   Malva Limes, MD  dicyclomine (BENTYL) 10 MG capsule Take 1 capsule (10 mg total) by mouth 3 (three) times daily as needed (stomach spasms). 11/17/22   Malva Limes, MD  eletriptan (RELPAX) 40 MG tablet Take 1 tablet (40 mg total) by mouth as needed for migraine or headache. May repeat in 2 hours if headache persists or recurs. 12/11/22   Carlean Jews, NP  fexofenadine-pseudoephedrine (ALLEGRA-D 24) 180-240 MG 24 hr tablet Take 1 tablet by mouth daily.    [provider]  fluticasone (FLONASE) 50 MCG/ACT nasal spray Place 2 sprays into both nostrils daily. 06/26/19   Malva Limes, MD  methocarbamol (ROBAXIN) 750 MG tablet Take 1-2 tablets (750-1,500 mg total) by mouth every 8 (eight) hours as needed for muscle spasms. 12/13/23   Aura Dials T, NP  Multiple Vitamin (MULTIVITAMIN) capsule Take 1 capsule by mouth daily.    [provider]  Omega-3 Fatty Acids (FISH OIL) 1000 MG CAPS Take 2 capsules by mouth 2 (two) times daily. 05/07/10   [provider]  omeprazole (PRILOSEC) 20 MG capsule Take 1 capsule (20 mg total) by mouth daily as needed. 10/23/22   Malva Limes, MD    Family History Family History  Problem Relation Age of Onset   Non-Hodgkin's lymphoma Mother 18   Cancer Mother    Early death Mother    Obesity Mother    Cerebrovascular Disease Father    Diabetes Father        type 2   CAD Father    Alcohol abuse Father    Drug abuse  Father    Obesity Father    Prostate cancer Maternal Uncle    Arthritis Maternal Grandmother    Depression Maternal Grandmother    Hearing loss Maternal Grandmother    Heart disease Maternal Grandmother    Kidney disease Maternal Grandmother    Obesity Maternal Grandmother    Heart disease Maternal Grandfather    Obesity Maternal Grandfather     Social History Social History   Tobacco Use   Smoking status: Never   Smokeless tobacco: Never  Vaping Use   Vaping status: Never Used  Substance Use Topics   Alcohol use: Not Currently    Comment: occasionally   Drug use: No     Allergies   Prednisone and Pollen extract   Review of Systems Review of Systems   Physical Exam Triage Vital Signs ED Triage Vitals  Encounter Vitals Group     BP 01/09/24 1158 126/81     Systolic BP Percentile --      Diastolic BP Percentile --      Pulse Rate 01/09/24 1158 87     Resp 01/09/24 1158 18     Temp 01/09/24 1158 98.9 F (37.2 C)     Temp Source 01/09/24 1158 Oral     SpO2 01/09/24 1158 98 %     Weight --      Height --      Head Circumference --      Peak Flow --      Pain Score 01/09/24 1159 2     Pain Loc --      Pain Education --      Exclude from Growth Chart --    No data found.  Updated Vital Signs BP 126/81 (BP Location: Left Arm)   Pulse 87   Temp 98.9 F (37.2 C) (Oral)   Resp 18   SpO2 98%   Visual Acuity Right Eye Distance:   Left Eye Distance:   Bilateral Distance:    Right Eye Near:   Left Eye Near:    Bilateral Near:     Physical Exam Constitutional:      Appearance: Normal appearance.  Eyes:     Extraocular Movements: Extraocular movements intact.  Pulmonary:     Effort: Pulmonary effort is normal.  Abdominal:     General: Abdomen is flat. Bowel sounds are increased.     Palpations: Abdomen is soft.     Tenderness: There is abdominal tenderness in the right upper quadrant. There is no guarding.  Neurological:     Mental Status: He is  alert and oriented to person, place, and time.      UC Treatments / Results  Labs (all labs ordered are listed, but only abnormal results are displayed) Labs Reviewed  COMPREHENSIVE METABOLIC PANEL WITH GFR    EKG  Radiology No results found.  Procedures Procedures (including critical care time)  Medications Ordered in UC Medications - No data to display  Initial Impression / Assessment and Plan / UC Course  I have reviewed the triage vital signs and the nursing notes.  Pertinent labs & imaging results that were available during my care of the patient were reviewed by me and considered in my medical decision making (see chart for details).  Right upper quadrant pain  Tenderness noted on exam, patient not guarding nor in signs of distress nontoxic, stable for outpatient management, increased bowel sounds present, discussed, etiology possibly acid reflux and indigestion due to consumption of heavily greasy meal prior to symptoms beginning, did see no improvement with Tums therefore recommended additional over-the-counter options, CMP pending, requested due to her prior event, plans to follow-up with PCP in the upcoming week, advised for any worsening symptoms to go to the nearest emergency department for immediate evaluation Final Clinical Impressions(s) / UC Diagnoses   Final diagnoses:  RUQ abdominal pain     Discharge Instructions      Today you are evaluated for your abdominal pain, on exam you do have some tenderness to the right upper quadrant also bowel sounds are increased it is possible that your symptoms are related to gas  I do recommend you attempt something other than time such as Pepto, Maalox or simethicone to see if this does you better and gives you relief  At any point if your abdominal pain worsens in severity please go to the nearest emergency department as you may need imaging at your origin could possibly be inflamed or infected  Complete metabolic  panel is pending, you will be notified of results may take up to 24 hours for results to return  May take Tylenol and or Motrin as needed for pain  Follow-up with your primary doctor for further evaluation   ED Prescriptions   None    PDMP not reviewed this encounter.   Valinda Hoar, NP 01/09/24 1324

## 2024-01-09 NOTE — Discharge Instructions (Signed)
 Today you are evaluated for your abdominal pain, on exam you do have some tenderness to the right upper quadrant also bowel sounds are increased it is possible that your symptoms are related to gas  I do recommend you attempt something other than time such as Pepto, Maalox or simethicone to see if this does you better and gives you relief  At any point if your abdominal pain worsens in severity please go to the nearest emergency department as you may need imaging at your origin could possibly be inflamed or infected  Complete metabolic panel is pending, you will be notified of results may take up to 24 hours for results to return  May take Tylenol and or Motrin as needed for pain  Follow-up with your primary doctor for further evaluation

## 2024-01-10 ENCOUNTER — Ambulatory Visit: Payer: BC Managed Care – PPO

## 2024-01-10 LAB — COMPREHENSIVE METABOLIC PANEL WITH GFR
ALT: 30 IU/L (ref 0–44)
AST: 19 IU/L (ref 0–40)
Albumin: 4.5 g/dL (ref 4.1–5.1)
Alkaline Phosphatase: 64 IU/L (ref 44–121)
BUN/Creatinine Ratio: 15 (ref 9–20)
BUN: 12 mg/dL (ref 6–20)
Bilirubin Total: 0.4 mg/dL (ref 0.0–1.2)
CO2: 24 mmol/L (ref 20–29)
Calcium: 9.4 mg/dL (ref 8.7–10.2)
Chloride: 104 mmol/L (ref 96–106)
Creatinine, Ser: 0.79 mg/dL (ref 0.76–1.27)
Globulin, Total: 2.3 g/dL (ref 1.5–4.5)
Glucose: 101 mg/dL — ABNORMAL HIGH (ref 70–99)
Potassium: 4.6 mmol/L (ref 3.5–5.2)
Sodium: 140 mmol/L (ref 134–144)
Total Protein: 6.8 g/dL (ref 6.0–8.5)
eGFR: 119 mL/min/{1.73_m2} (ref >59)

## 2024-01-10 NOTE — Telephone Encounter (Signed)
 Appointment has been made

## 2024-01-10 NOTE — Patient Instructions (Signed)
 Abdominal Pain, Adult    Many things can cause belly (abdominal) pain. In most cases, belly pain is not a serious problem and can be watched and treated at home. But in some cases, it can be serious.  Your doctor will try to find the cause of your belly pain.  Follow these instructions at home:  Medicines  Take over-the-counter and prescription medicines only as told by your doctor.  Do not take medicines that help you poop (laxatives) unless told by your doctor.  General instructions  Watch your belly pain for any changes. Tell your doctor if the pain gets worse.  Drink enough fluid to keep your pee (urine) pale yellow.  Contact a doctor if:  Your belly pain changes or gets worse.  You have very bad cramping or bloating in your belly.  You vomit.  Your pain gets worse with meals, after eating, or with certain foods.  You have trouble pooping or have watery poop for more than 2-3 days.  You are not hungry, or you lose weight without trying.  You have signs of not getting enough fluid or water (dehydration). These may include:  Dark pee, very Chastain pee, or no pee.  Cracked lips or dry mouth.  Feeling sleepy or weak.  You have pain when you pee or poop.  Your belly pain wakes you up at night.  You have blood in your pee.  You have a fever.  Get help right away if:  You cannot stop vomiting.  Your pain is only in one part of your belly, like on the right side.  You have bloody or black poop, or poop that looks like tar.  You have trouble breathing.  You have chest pain.  These symptoms may be an emergency. Get help right away. Call 911.  Do not wait to see if the symptoms will go away.  Do not drive yourself to the hospital.  This information is not intended to replace advice given to you by your health care provider. Make sure you discuss any questions you have with your health care provider.  Document Revised: 07/15/2022 Document Reviewed: 07/15/2022  Elsevier Patient Education  2024 [employer].

## 2024-01-13 ENCOUNTER — Ambulatory Visit: Payer: BC Managed Care – PPO | Attending: Nurse Practitioner

## 2024-01-13 DIAGNOSIS — M542 Cervicalgia: Secondary | ICD-10-CM | POA: Insufficient documentation

## 2024-01-13 NOTE — Therapy (Signed)
 OUTPATIENT PHYSICAL THERAPY TREATMENT Re-certification for today's session And Discharge Summary   Patient Name: Jesus Gardner MRN: [id number] DOB:09/06/1991, 33 y.o., male Today's Date: 01/13/2024  END OF SESSION:  PT End of Session - 01/13/24 1351     Visit Number 15    Number of Visits 17    Date for PT Re-Evaluation 01/07/24    PT Start Time 1351    PT Stop Time 1434    PT Time Calculation (min) 43 min    Activity Tolerance Patient tolerated treatment well    Behavior During Therapy [facility] for tasks assessed/performed                          Past Medical History:  Diagnosis Date   Allergy    Costochondritis 12/30/2015   Depression    Elevated transaminase level 03/12/2016   GERD (gastroesophageal reflux disease)    History of chicken pox    Past Surgical History:  Procedure Laterality Date   COLONOSCOPY WITH PROPOFOL N/A 08/20/2020   Procedure: COLONOSCOPY WITH PROPOFOL;  Surgeon: Midge Minium, MD;  Location: [facility] ENDOSCOPY;  Service: Endoscopy;  Laterality: N/A;   None     Patient Active Problem List   Diagnosis Date Noted   Ear fullness, bilateral 12/20/2023   Neck pain 09/07/2023   Intractable migraine with aura without status migrainosus 01/17/2023   BMI 40.0-44.9, adult (HCC) 01/17/2023   Diverticulosis 03/21/2019   OSA (obstructive sleep apnea) 12/06/2018   Allergic rhinitis 12/30/2015   Motion sickness 12/30/2015   Obesity 12/30/2015   Hyperlipidemia, mixed 03/06/2009   Fam hx-ischem heart disease 10/12/1998    PCP: Marjie Skiff, NP  REFERRING PROVIDER: Marjie Skiff, NP  REFERRING DIAG: M54.2 (ICD-10-CM) - Neck pain  THERAPY DIAG:  Cervicalgia - Plan: PT plan of care cert/re-cert  Rationale for Evaluation and Treatment: Rehabilitation  ONSET DATE: 2020  SUBJECTIVE:                                                                                                                                                                                                          SUBJECTIVE STATEMENT:  Has had R trunk pain a few days ago, Sunday. Might have had food poisoning. Got a really bad migraine just at the back of his head. His headache used to be from the back of the head, neck, and radiating to his shoulders. Neck pain, 2/10 currently.  Pt could do his HEP at home.       Hand dominance:  Right  PERTINENT HISTORY:  Neck pain. Pain began around 2020. Had L upper trap and L lateral neck pain. At one time around 2020, pt woke up one day with L UE and LE pain. Went to the ER, pt did not have a CVA, or a hear attack. Pt was given muscle relaxer which did not help. Could not hold things with L hand and pt would drop things. Had massage therapy L upper trap area which helped. Currently has neck and B upper trap and B medial shoulder blade pain (scalene muscle referral pattern). Gets B posterior neck muscle tightness which causes cervicogenic headache.    No latex allergies  PAIN:  Are you having pain? Yes: NPRS scale: /10 Pain location: Neck (posterior and B upper trap and B medial scapular area) Pain description: tight, sharp at times, constant dull ache Aggravating factors: heat, turning his head, looking up, extraneous activity such as yard work, lifting weights  Relieving factors: looking down, sitting with his hands supporting his head, sleep, massage, ice  PRECAUTIONS: No known precautions  RED FLAGS: Bowel or bladder incontinence: No and Cauda equina syndrome: No     WEIGHT BEARING RESTRICTIONS: No  FALLS:  Has patient fallen in last 6 months? 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   PLOF: Independent  PATIENT GOALS: pain relief  NEXT MD VISIT: 04/12/2024  OBJECTIVE:  Note: Objective measures were completed at Evaluation unless otherwise noted.  DIAGNOSTIC FINDINGS:  DG Cervical Spine Complete 09/30/2024 Narrative & Impression  CLINICAL DATA:  Chronic neck pain    EXAM: CERVICAL SPINE - COMPLETE 4+ VIEW   COMPARISON:  03/29/2019   FINDINGS: There is no evidence of cervical spine fracture or prevertebral soft tissue swelling. Alignment is normal. No other significant bone abnormalities are identified.   IMPRESSION: Negative cervical spine radiographs.     Electronically Signed   By: Judie Petit.  Shick M.D.   On: 10/10/2023 15:50            PATIENT SURVEYS:  NDI 17 (11/08/2023)  COGNITION: Overall cognitive status: Within functional limits for tasks assessed  SENSATION: WFL  POSTURE: cervical protraction, movement preference around C5-C6 area, B protracted shoulders, L lateral shift.  PALPATION: Cervical paraspinal muscle tension, B upper trap muscle tension R > L, decreased caudal glide R and L first rib  Decreased CPA mid to upper thoracic spine with reproduction of symptoims.     CERVICAL ROM:   Active ROM A/PROM (deg) eval  Flexion Full with B cervical paraspinal muscle pulling  Extension Limited with L medial scapular pulling and pain, and cervical spine pain.   Right lateral flexion WFL with L lateral neck tightness  Left lateral flexion WFL with R lateral neck tightness  Right rotation 65 with neck tightness  Left rotation 73 with neck tightness   (Blank rows = not tested)  UPPER EXTREMITY ROM:  Active ROM Right eval Left eval  Shoulder flexion Limited with medial scapular pain Limited with medial scapular pain  Shoulder extension    Shoulder abduction    Shoulder adduction    Shoulder extension    Shoulder internal rotation    Shoulder external rotation    Elbow flexion    Elbow extension    Wrist flexion    Wrist extension    Wrist ulnar deviation    Wrist radial deviation    Wrist pronation    Wrist supination     (Blank rows = not tested)  UPPER EXTREMITY MMT:  MMT Right eval Left eval  Shoulder flexion 4 4  Shoulder extension    Shoulder abduction 4 (with R lateral shoulder and arm pain) 4-  with L shoulder pull  Shoulder adduction    Shoulder extension    Shoulder internal rotation 4- 4-  Shoulder external rotation 4 with R lateral shoulder pain 4-  Middle trapezius 4- (with R lateral arm pain) 4-  Lower trapezius 4- (with R lateral arm pain) 3+  Elbow flexion    Elbow extension    Wrist flexion    Wrist extension    Wrist ulnar deviation    Wrist radial deviation    Wrist pronation    Wrist supination    Grip strength     (Blank rows = not tested)  CERVICAL SPECIAL TESTS:   VBI testing: positive R side with pt reports of tunnel vision  Negative for L side. Pt was recommended to contact his PCP. Pt verbalized understanding.    FUNCTIONAL TESTS:    TREATMENT DATE: 01/13/24                                                                                                    Therapeutic activities   Manually resisted shoulder ER, IR, lower trap and middle trap raise  Time taken for NDI  Reviewed progress/current status with PT towards goals.   Reviewed POC. Graduate PT today and continue progress with his HEP.    Seated press-up 10x3 with 5 second holds   Seated with B scapular retraction  B shoulder flexion lifting 7.5 lbs ankle weight to 90 degrees to simulate lifting a baby. 5x3   Lifting a 7.5 lb ankle weight "baby" out of a "crib" 5x2 with emphasis on scapular positioning.    Seated chin tucks 10x5 seconds  Modified bent over rows lifting 7.5 lbs ankle weight with scapular retraction to simulate lifting a baby from the bed.   9x. Low back discomfort  Sitting with lumbar towel roll.   Decreased low back discomfort.    Improved exercise technique, movement at target joints, use of target muscles after mod verbal, visual, tactile cues.           PATIENT EDUCATION:  Education details: There-ex, HEP Person educated: Patient Education method: Explanation, Demonstration, Tactile cues, Verbal cues, and Handouts Education comprehension:  verbalized understanding and returned demonstration  HOME EXERCISE PROGRAM: Access Code: [email or web address] URL: [email or web address] Date: 11/10/2023 Prepared by: Loralyn Freshwater  Exercises - Shoulder extension with resistance - Neutral  - 1 x daily - 7 x weekly - 2 sets - 10 reps - 5 seconds hold  Green band   Discontinued on 11/17/2023   Access Code: [email or web address] URL: [email or web address] Date: 11/17/2023 Prepared by: Loralyn Freshwater  Exercises - Supine Head Nod Deep Neck Flexor Training  - 3 x daily - 7 x weekly - 3 sets - 10 reps - 5 seconds hold  - Supine Scapular Retraction  - 1 x daily - 7 x weekly - 3 sets - 10 reps - 5 seconds hold - Supine Shoulder Horizontal  Abduction with Dumbbells  - 3 x daily - 7 x weekly - 3 sets - 10 reps - 5 seconds hold - First Rib Mobilization with Strap  - 3 x daily - 7 x weekly - 1 sets - 5 reps - 30 seconds hold - Seated Thoracic Lumbar Extension  - 1 x daily - 7 x weekly - 3 sets - 10 reps - 5 seconds hold  - Seated Assisted Cervical Rotation with Towel  - 1 x daily - 7 x weekly - 3 sets - 10 reps - Seated Shoulder Press Ups Off Table  - 1 x daily - 7 x weekly - 3 sets - 10 reps - 5 seconds hold  Sitting with lumbar towel roll.   ASSESSMENT:  CLINICAL IMPRESSION:  Pt demonstrates overall decreased neck pain, overall improved B shoulder strength and function since initial evaluation. Pt has made good progress with PT towards goals and demonstrates independence with his HEP. Skilled physical therapy services discharged after recert for today's session with pt continuing progress with his exercises at home.      OBJECTIVE IMPAIRMENTS: decreased ROM, decreased strength, improper body mechanics, postural dysfunction, and pain.   ACTIVITY LIMITATIONS: carrying, lifting, and reach over head  PARTICIPATION LIMITATIONS:   PERSONAL FACTORS: Fitness, Profession, Time since onset of injury/illness/exacerbation, and 1 comorbidity:  depression  are also affecting patient's functional outcome.   REHAB POTENTIAL: Fair    CLINICAL DECISION MAKING: Stable/uncomplicated  EVALUATION COMPLEXITY: Low   GOALS: Goals reviewed with patient? Yes  SHORT TERM GOALS: Target date: 11/19/2023  Pt will be independent with his initial HEP to decrease pain, improve strength, function, ability to look around as well as lift without neck pain.  Baseline: Pt has not yet started his initial HEP (11/08/2023); No questions with his HEP, Able to perform them (12/15/2023)  Goal status: MET    LONG TERM GOALS: Target date: 01/07/2024  Pt will have a decrease in neck pain to 3/10 or less at worst to promote ability to look around, lift, perform yard work as well as work out at [employer] more comfortably for his neck.  Baseline: 7/10 at most for the past 3 months (11/08/2023); 4/10 neck and upper trap pain at most for the past 7 days (12/15/2023); 2/10 at most for the past 7 days (01/05/2024); 2-3/10 overall at most for the past 7 days (01/13/2024) Goal status: MET  2.  Pt will improve B lower, middle trap, ER, IR strength by at least 1/2 MMT grade to promote ability to look around, and lift with less neck pain.  Baseline:  MMT Right eval Left eval R (12/15/2023) L  (12/15/2023)  Shoulder internal rotation 4- 4- 4+ 4+  Shoulder external rotation 4 with R lateral shoulder pain 4- 4 with R lateral shoulder pain 4  Middle trapezius 4- (with R lateral arm pain) 4- 4 (with R lateral arm pain) 4  Lower trapezius 4- (with R lateral arm pain) 3+ 4- (with R lateral arm pain) 4   MMT Right (01/13/2024) Left (01/13/2024)  Shoulder internal rotation 4+ 4+  Shoulder external rotation 4 with R lateral shoulder pain 5  Middle trapezius 4- (with R lateral arm pain) 4-  Lower trapezius 4- (with R lateral arm pain) 4-    Goal status: Partially met  3.  Pt will have a decrease in Neck Disability Index (NDI) score by at least 7 points as a demonstration of improved  function.  Baseline: NDI score of 17  suggesting moderate disability (11/08/2023); NDI score of 11 (01/13/2024)  Goal status: Ongoing     PLAN:  PT FREQUENCY:   PT DURATION: today's visit  PLANNED INTERVENTIONS: 97110-Therapeutic exercises, 97530- Therapeutic activity, 97112- Neuromuscular re-education, 97535- Self Care, 16109- Manual therapy, 97014- Electrical stimulation (unattended), and 97033- Ionotophoresis 4mg /ml Dexamethasone  PLAN FOR NEXT SESSION: posture, anterior cervical, lower trap strengthening, manual techniques, modalities PRN  Thank you for your referral.   Loralyn Freshwater PT, DPT Physical Therapist - [facility]  01/13/24, 4:32 PM

## 2024-01-14 DIAGNOSIS — J301 Allergic rhinitis due to pollen: Secondary | ICD-10-CM | POA: Diagnosis not present

## 2024-01-14 DIAGNOSIS — H93292 Other abnormal auditory perceptions, left ear: Secondary | ICD-10-CM | POA: Diagnosis not present

## 2024-01-14 DIAGNOSIS — J342 Deviated nasal septum: Secondary | ICD-10-CM | POA: Diagnosis not present

## 2024-01-14 DIAGNOSIS — M26652 Arthropathy of left temporomandibular joint: Secondary | ICD-10-CM | POA: Diagnosis not present

## 2024-01-14 DIAGNOSIS — G4733 Obstructive sleep apnea (adult) (pediatric): Secondary | ICD-10-CM | POA: Diagnosis not present

## 2024-01-17 ENCOUNTER — Ambulatory Visit: Payer: BC Managed Care – PPO

## 2024-01-19 NOTE — Progress Notes (Signed)
 Office Visit Note  Patient: Jesus Gardner             Date of Birth: 1991-05-11           MRN: [id number]             PCP: Lemar Pyles, NP Referring: Lemar Pyles, NP Visit Date: 02/02/2024 Occupation: @GUAROCC @  Subjective:  Pain in multiple joints and positive ANA  History of Present Illness: OMEGA SLAGER is a 33 y.o. male seen for the evaluation of positive ANA and joint pain.  According the patient he had pain in his knee joints since he was in high school.  He states he played football and was seen by a sports medicine doctor who diagnosed him with chondromalacia patella.  He had physical therapy which helped to some extent but he had ongoing knee joint discomfort.  He states he started having migraines in 2016 and had light sensitivity and nausea associated with migraines.  He states the migraines gradually became more frequent.  In 2020 he started having intermittent left shoulder joint pain which became more frequent over time.  He also started having constant back pain.  He states 1 day he woke up with left shoulder pain and left-sided facial numbness.  He was also experiencing left-sided arm and leg numbness.  He states his grip strength was poor and he dropped several objects.  He went to the emergency room twice where his cardiology workup was negative.  He also recalls having MRI of the cervical spine which was unremarkable.  He states he was given muscle relaxers and gradually the numbness improved but did not completely resolved.  He started going to a massage therapist and was given muscle relaxers and after the massage the numbness improved.  He also noticed improvement in the muscle tightness.  He started going to a new PCP who did labs and his ANA came positive for that reason he was referred to me.  He states he was also referred to physical therapy which helped with the neck pain and headaches.  He continues to have some discomfort in his shoulders, knee joints and  lower back.  He has none of that the joints are painful he has not seen any joint swelling.  He relates dry eyes to allergies.  There is no history of oral ulcers, nasal ulcers, malar rash, photosensitivity, Raynaud's or lymphadenopathy.  He is right-handed, he works as an Programmer, multimedia at [employer].  He enjoys yard work and does walk for exercise.  He is married and just had a baby girl 2 days ago.  He does not drink any alcohol and is non-smoker.  There is no family history of autoimmune disease.  His mother passed at age 2 from non-Hodgkin's lymphoma.    Activities of Daily Living:  Patient reports morning stiffness for 30 minutes.   Patient Reports nocturnal pain.  Difficulty dressing/grooming: Denies Difficulty climbing stairs: Denies Difficulty getting out of chair: Denies Difficulty using hands for taps, buttons, cutlery, and/or writing: Denies  Review of Systems  Constitutional:  Positive for fatigue.  HENT:  Negative for mouth sores, mouth dryness and nose dryness.   Eyes:  Positive for dryness. Negative for pain.  Respiratory:  Negative for shortness of breath.   Cardiovascular:  Negative for chest pain and palpitations.  Gastrointestinal:  Positive for constipation and diarrhea. Negative for blood in stool.  Endocrine: Negative for increased urination.  Genitourinary:  Negative for involuntary urination.  Musculoskeletal:  Positive for joint pain, joint pain, myalgias, morning stiffness and myalgias. Negative for gait problem, joint swelling, muscle weakness and muscle tenderness.  Skin:  Positive for hair loss. Negative for color change, rash and sensitivity to sunlight.       Hair loss on legs   Allergic/Immunologic: Negative for susceptible to infections.  Neurological:  Positive for numbness and headaches. Negative for dizziness.  Hematological:  Negative for swollen glands.  Psychiatric/Behavioral:  Negative for depressed mood and sleep disturbance. The patient  is nervous/anxious.     PMFS History:  Patient Active Problem List   Diagnosis Date Noted   RUQ pain 01/21/2024   Ear fullness, bilateral 12/20/2023   Neck pain 09/07/2023   Intractable migraine with aura without status migrainosus 01/17/2023   BMI 40.0-44.9, adult (HCC) 01/17/2023   Diverticulosis 03/21/2019   OSA (obstructive sleep apnea) 12/06/2018   Allergic rhinitis 12/30/2015   Motion sickness 12/30/2015   Obesity 12/30/2015   Hyperlipidemia, mixed 03/06/2009   Fam hx-ischem heart disease 10/12/1998    Past Medical History:  Diagnosis Date   Allergy    Chondromalacia, patella    bilateral knees   Costochondritis 12/30/2015   Depression    Elevated transaminase level 03/12/2016   GERD (gastroesophageal reflux disease)    History of chicken pox     Family History  Problem Relation Age of Onset   Non-Hodgkin's lymphoma Mother 24   Cancer Mother    Early death Mother    Obesity Mother    Cerebrovascular Disease Father        family hx   CAD Father        family hx   Diabetes Father        type 2   Alcohol abuse Father    Drug abuse Father    Obesity Father    Prostate cancer Maternal Uncle    Arthritis Maternal Grandmother    Depression Maternal Grandmother    Hearing loss Maternal Grandmother    Heart disease Maternal Grandmother    Kidney disease Maternal Grandmother    Obesity Maternal Grandmother    Heart disease Maternal Grandfather    Obesity Maternal Grandfather    Cancer Paternal Grandfather    Past Surgical History:  Procedure Laterality Date   COLONOSCOPY WITH PROPOFOL  N/A 08/20/2020   Procedure: COLONOSCOPY WITH PROPOFOL ;  Surgeon: Marnee Sink, MD;  Location: [facility] ENDOSCOPY;  Service: Endoscopy;  Laterality: N/A;   HEMORRHOID BANDING     None     Social History   Social History Narrative   Not on file   Immunization History  Administered Date(s) Administered   DTaP 12/18/1988, 02/16/1989, 04/26/1989, 04/29/1990, 02/13/1993   Dtap,  Unspecified 12/18/1988, 02/16/1989, 04/26/1989, 04/29/1990, 02/13/1993   HIB (PRP-OMP) 10/22/1989, 01/28/1990   HIB, Unspecified 10/22/1989, 01/28/1990   Hep B, Unspecified 08/06/2000, 09/10/2000, 02/11/2001   Hepatitis B 08/06/2000, 09/10/2000, 02/11/2001   IPV 12/18/1988, 02/16/1989, 04/29/1990, 02/13/1993   Influenza,inj,Quad PF,6+ Mos 11/16/2018, 07/09/2021, 07/13/2022   Influenza-Unspecified 07/28/2019, 07/17/2020, 08/21/2023   MMR 01/28/1990, 02/13/1993   Moderna Covid-19 Fall Seasonal Vaccine 16yrs & older 08/30/2022, 08/21/2023   Polio, Unspecified 12/18/1988, 02/16/1989, 04/29/1990, 02/13/1993   Td 03/24/2006   Tdap 04/26/2012, 04/24/2022     Objective: Vital Signs: BP 127/78 (BP Location: Right Arm, Patient Position: Sitting, Cuff Size: Large)   Pulse 73   Resp 16   Ht 6' 2.5" (1.892 m)   Wt (!) 344 lb 9.6 oz (156.3 kg)   BMI 43.65  kg/m    Physical Exam Vitals and nursing note reviewed.  Constitutional:      Appearance: He is well-developed.  HENT:     Head: Normocephalic and atraumatic.  Eyes:     Conjunctiva/sclera: Conjunctivae normal.     Pupils: Pupils are equal, round, and reactive to light.  Cardiovascular:     Rate and Rhythm: Normal rate and regular rhythm.     Heart sounds: Normal heart sounds.  Pulmonary:     Effort: Pulmonary effort is normal.     Breath sounds: Normal breath sounds.  Abdominal:     General: Bowel sounds are normal.     Palpations: Abdomen is soft.  Musculoskeletal:     Cervical back: Normal range of motion and neck supple.  Skin:    General: Skin is warm and dry.     Capillary Refill: Capillary refill takes less than 2 seconds.  Neurological:     Mental Status: He is alert and oriented to person, place, and time.  Psychiatric:        Behavior: Behavior normal.      Musculoskeletal Exam: He had good range of motion of the cervical spine.  He had discomfort range of motion of the lumbar spine.  There was some tenderness in  the lower lumbar region.  There was no SI joint tenderness.  He had discomfort with range of motion of the right shoulder joint.  No warmth swelling or effusion was noted.  Left shoulder joint was in full range of motion.  Elbow joints, wrist joints, MCPs PIPs and DIPs were in good range of motion with no synovitis.  Hip joints, knee joints were in good range of motion without any warmth swelling or effusion.  There was no tenderness over ankles or MTPs.  CDAI Exam: CDAI Score: -- Patient Global: --; Provider Global: -- Swollen: --; Tender: -- Joint Exam 02/02/2024   No joint exam has been documented for this visit   There is currently no information documented on the homunculus. Go to the Rheumatology activity and complete the homunculus joint exam.  Investigation: No additional findings.  Imaging: No results found.  Recent Labs: Lab Results  Component Value Date   WBC 5.6 09/07/2023   HGB 14.3 09/07/2023   PLT 265 09/07/2023   NA 140 01/09/2024   K 4.6 01/09/2024   CL 104 01/09/2024   CO2 24 01/09/2024   GLUCOSE 101 (H) 01/09/2024   BUN 12 01/09/2024   CREATININE 0.79 01/09/2024   BILITOT 0.4 01/09/2024   ALKPHOS 64 01/09/2024   AST 19 01/09/2024   ALT 30 01/09/2024   PROT 6.8 01/09/2024   ALBUMIN 4.5 01/09/2024   CALCIUM 9.4 01/09/2024   GFRAA 142 12/29/2019   September 07, 2023 ANA 1: 160 ENH, 1: 160 NS, (anticentromere, dsDNA, Noffke, Jo 1 RNP, SSA, SSB, SCL 70) negative, anticardiolipin negative, C3-C4 normal, RF negative, anti-CCP negative, antichromatin negative, anti-TPO negative, CRP 4, sed rate 26, TSH normal Speciality Comments: No specialty comments available.  Procedures:  No procedures performed Allergies: Prednisone  and Pollen extract   Assessment / Plan:     Visit Diagnoses: Positive ANA (antinuclear antibody) -patient has positive ANA low titer.  ENA negative, complements normal, anticardiolipin negative, complements normal.  Patient denies any history  of oral ulcers, nasal ulcers, malar rash, photosensitivity, Raynaud's or lymphadenopathy.  Detail counseled regarding positive ANA was provided.  I do not see any clinical features of lupus or related disease.  I will repeat ANA  today and also check urine protein creatinine ratio.  Will contact him after the lab results are available.  I also advised him to contact me if he develops any new symptoms.  Plan: ANA, Protein / creatinine ratio, urine.  Will contact him once the lab results are available.  Neck pain -he continues to have neck and pain and discomfort.  October 01, 2023 x-rays of the cervical spine were unremarkable.  CT a of neck with and without contrast was unremarkable on December 01, 2023.  He has noticed improvement in the neck pain since he has been going to massage therapist and physical therapist.  Chronic midline low back pain without sciatica -he complains of chronic lower back pain.  He denies any radiculopathy.  Plan: XR Lumbar Spine 2-3 Views.  X-rays showed lumbar facet joint arthropathy.  Anterior osteophytes were noted in the thoracic vertebrae.  These were reviewed with the patient.  A handout on back exercises was given.  Chronic right shoulder pain -he has right shoulder joint discomfort.  No warmth swelling or effusion was noted.  Plan: XR Shoulder Right.  X-rays were unremarkable.  X-rays reviewed with the patient.  A handout on shoulder exercises was given.  Chronic pain of right knee -he complains of knee joint pain since he was in high school.  He states he was told that he had had chondromalacia patella.  He had discomfort in his right knee joint without any warmth swelling or effusion.  Plan: XR KNEE 3 VIEW RIGHT.  X-rays were unremarkable.  X-ray results were reviewed with the patient.  A handout on knee exercises was given.  Hyperlipidemia, mixed-April 28, 2022 LDL and triglycerides were elevated.  HDL was low.  Diverticulosis  Gastroesophageal reflux disease  without esophagitis-patient reports mild intermittent symptoms.  Intractable migraine with aura without status migrainosus  Seasonal allergic rhinitis due to pollen  OSA (obstructive sleep apnea) - mild per patient  BMI 40.0-44.9, adult (HCC)  Orders: Orders Placed This Encounter  Procedures   XR KNEE 3 VIEW RIGHT   XR Shoulder Right   XR Lumbar Spine 2-3 Views   ANA   Protein / creatinine ratio, urine   No orders of the defined types were placed in this encounter.    Follow-Up Instructions: Return in about 1 year (around 02/01/2025) for Myalgias, positive ANA.   Nicholas Bari, MD  Note - This record has been created using Animal nutritionist.  Chart creation errors have been sought, but may not always  have been located. Such creation errors do not reflect on  the standard of medical care.

## 2024-01-21 ENCOUNTER — Encounter: Payer: Self-pay | Admitting: Nurse Practitioner

## 2024-01-21 ENCOUNTER — Ambulatory Visit (INDEPENDENT_AMBULATORY_CARE_PROVIDER_SITE_OTHER): Admitting: Nurse Practitioner

## 2024-01-21 VITALS — BP 107/71 | HR 71 | Temp 98.4°F | Ht 74.0 in | Wt 342.6 lb

## 2024-01-21 DIAGNOSIS — R1011 Right upper quadrant pain: Secondary | ICD-10-CM | POA: Diagnosis not present

## 2024-01-21 NOTE — Assessment & Plan Note (Signed)
 Improved at this time, CMP overall reassuring.  ?gallstones.  Have recommended he monitor symptoms closely and if continues to have episodes like past one then alert provider as may benefit ultrasound to further assess.  All questions answered.

## 2024-01-21 NOTE — Progress Notes (Signed)
 BP 107/71   Pulse 71   Temp 98.4 F (36.9 C) (Oral)   Ht 6\' 2"  (1.88 m)   Wt (!) 342 lb 9.6 oz (155.4 kg)   SpO2 98%   BMI 43.99 kg/m    Subjective:    Patient ID: Jesus Gardner, male    DOB: [birth year], 33 y.o.   MRN: [id number]  HPI: Jesus Gardner is a 33 y.o. male  Chief Complaint  Patient presents with   Abdominal Pain    Patient states he was having abdominal pain last week, went to UC and nothing was found. States the pain went away within like a day or 2.    ABDOMINAL PAIN  Presents today for follow-up on RUQ abdominal pain.  Was out at [employer] last week (01/08/24), goes there often and tolerates food, ate fish and chips. Was not able to eat the first day due to N&V. Went to urgent care on 01/09/24, had blood work performed which was reassuring. After a day or so felt better.  Duration:days Onset: sudden Severity: 10/10 at worst when present, no pain today Quality: sharp initially and then became dull and aching Location:  RUQ  Episode duration: a couple days Radiation: no Frequency: constant Alleviating factors: rest and fluids Aggravating factors: unknown Status: better Treatments attempted: rest and fluids Fever: no Nausea: yes Vomiting: yes Weight loss: no Decreased appetite: yes Diarrhea: no Constipation: at baseline intermittent Blood in stool: no Heartburn: no  Relevant past medical, surgical, family and social history reviewed and updated as indicated. Interim medical history since our last visit reviewed. Allergies and medications reviewed and updated.  Review of Systems  Constitutional:  Negative for activity change, diaphoresis, fatigue and fever.  Respiratory:  Negative for cough, chest tightness, shortness of breath and wheezing.   Cardiovascular:  Negative for chest pain, palpitations and leg swelling.  Gastrointestinal:  Positive for abdominal pain (improved), constipation, nausea and vomiting. Negative for abdominal distention and  diarrhea.  Neurological: Negative.   Psychiatric/Behavioral: Negative.      Per HPI unless specifically indicated above     Objective:    BP 107/71   Pulse 71   Temp 98.4 F (36.9 C) (Oral)   Ht 6\' 2"  (1.88 m)   Wt (!) 342 lb 9.6 oz (155.4 kg)   SpO2 98%   BMI 43.99 kg/m   Wt Readings from Last 3 Encounters:  01/21/24 (!) 342 lb 9.6 oz (155.4 kg)  12/20/23 (!) 338 lb (153.3 kg)  10/08/23 (!) 338 lb 3.2 oz (153.4 kg)    Physical Exam Vitals and nursing note reviewed.  Constitutional:      General: He is awake. He is not in acute distress.    Appearance: He is well-developed and well-groomed. He is obese. He is not ill-appearing or toxic-appearing.  HENT:     Head: Normocephalic.     Right Ear: Hearing and external ear normal.     Left Ear: Hearing and external ear normal.  Eyes:     General: Lids are normal.     Extraocular Movements: Extraocular movements intact.     Conjunctiva/sclera: Conjunctivae normal.  Neck:     Thyroid: No thyromegaly.     Vascular: No carotid bruit.  Cardiovascular:     Rate and Rhythm: Normal rate and regular rhythm.     Heart sounds: Normal heart sounds. No murmur heard.    No gallop.  Pulmonary:     Effort: No accessory muscle usage or  respiratory distress.     Breath sounds: Normal breath sounds.  Abdominal:     General: Bowel sounds are normal. There is no distension.     Palpations: Abdomen is soft. There is no hepatomegaly.     Tenderness: There is abdominal tenderness. There is no guarding or rebound. Positive signs include Murphy's sign.  Musculoskeletal:     Cervical back: Full passive range of motion without pain.     Right lower leg: No edema.     Left lower leg: No edema.  Lymphadenopathy:     Cervical: No cervical adenopathy.     Upper Body:     Right upper body: No axillary adenopathy.  Skin:    General: Skin is warm.     Capillary Refill: Capillary refill takes less than 2 seconds.  Neurological:     Mental  Status: He is alert and oriented to person, place, and time.     Deep Tendon Reflexes: Reflexes are normal and symmetric.     Reflex Scores:      Brachioradialis reflexes are 2+ on the right side and 2+ on the left side.      Patellar reflexes are 2+ on the right side and 2+ on the left side. Psychiatric:        Attention and Perception: Attention normal.        Mood and Affect: Mood normal.        Speech: Speech normal.        Behavior: Behavior normal. Behavior is cooperative.        Thought Content: Thought content normal.    Results for orders placed or performed during the hospital encounter of 01/09/24  Comprehensive metabolic panel   Collection Time: 01/09/24 12:41 PM  Result Value Ref Range   Glucose 101 (H) 70 - 99 mg/dL   BUN 12 6 - 20 mg/dL   Creatinine, Ser 4.09 0.76 - 1.27 mg/dL   eGFR 811 >91 YN/WGN/5.62   BUN/Creatinine Ratio 15 9 - 20   Sodium 140 134 - 144 mmol/L   Potassium 4.6 3.5 - 5.2 mmol/L   Chloride 104 96 - 106 mmol/L   CO2 24 20 - 29 mmol/L   Calcium 9.4 8.7 - 10.2 mg/dL   Total Protein 6.8 6.0 - 8.5 g/dL   Albumin 4.5 4.1 - 5.1 g/dL   Globulin, Total 2.3 1.5 - 4.5 g/dL   Bilirubin Total 0.4 0.0 - 1.2 mg/dL   Alkaline Phosphatase 64 44 - 121 IU/L   AST 19 0 - 40 IU/L   ALT 30 0 - 44 IU/L      Assessment & Plan:   Problem List Items Addressed This Visit       Other   RUQ pain - Primary   Improved at this time, CMP overall reassuring.  ?gallstones.  Have recommended he monitor symptoms closely and if continues to have episodes like past one then alert provider as may benefit ultrasound to further assess.  All questions answered.          Follow up plan: Return if symptoms worsen or fail to improve.

## 2024-01-31 ENCOUNTER — Encounter: Payer: Self-pay | Admitting: Nurse Practitioner

## 2024-02-02 ENCOUNTER — Ambulatory Visit: Payer: BC Managed Care – PPO | Attending: Rheumatology | Admitting: Rheumatology

## 2024-02-02 ENCOUNTER — Encounter: Payer: Self-pay | Admitting: Rheumatology

## 2024-02-02 ENCOUNTER — Ambulatory Visit (INDEPENDENT_AMBULATORY_CARE_PROVIDER_SITE_OTHER)

## 2024-02-02 ENCOUNTER — Telehealth: Payer: Self-pay

## 2024-02-02 VITALS — BP 127/78 | HR 73 | Resp 16 | Ht 74.5 in | Wt 344.6 lb

## 2024-02-02 DIAGNOSIS — M25561 Pain in right knee: Secondary | ICD-10-CM

## 2024-02-02 DIAGNOSIS — M545 Low back pain, unspecified: Secondary | ICD-10-CM | POA: Diagnosis not present

## 2024-02-02 DIAGNOSIS — E782 Mixed hyperlipidemia: Secondary | ICD-10-CM

## 2024-02-02 DIAGNOSIS — J301 Allergic rhinitis due to pollen: Secondary | ICD-10-CM

## 2024-02-02 DIAGNOSIS — Z6841 Body Mass Index (BMI) 40.0 and over, adult: Secondary | ICD-10-CM

## 2024-02-02 DIAGNOSIS — R768 Other specified abnormal immunological findings in serum: Secondary | ICD-10-CM | POA: Diagnosis not present

## 2024-02-02 DIAGNOSIS — K5790 Diverticulosis of intestine, part unspecified, without perforation or abscess without bleeding: Secondary | ICD-10-CM

## 2024-02-02 DIAGNOSIS — M25511 Pain in right shoulder: Secondary | ICD-10-CM

## 2024-02-02 DIAGNOSIS — M542 Cervicalgia: Secondary | ICD-10-CM

## 2024-02-02 DIAGNOSIS — G43119 Migraine with aura, intractable, without status migrainosus: Secondary | ICD-10-CM

## 2024-02-02 DIAGNOSIS — G8929 Other chronic pain: Secondary | ICD-10-CM | POA: Diagnosis not present

## 2024-02-02 DIAGNOSIS — G4733 Obstructive sleep apnea (adult) (pediatric): Secondary | ICD-10-CM

## 2024-02-02 DIAGNOSIS — K219 Gastro-esophageal reflux disease without esophagitis: Secondary | ICD-10-CM

## 2024-02-02 NOTE — Telephone Encounter (Signed)
 Patient was seen as a new patient today, 02/02/2024. Per Dr. Alvira Josephs, if labs are normal, we can call with results and no follow up is needed. Thanks!

## 2024-02-02 NOTE — Patient Instructions (Addendum)
 Low Back Sprain or Strain Rehab Ask your health care provider which exercises are safe for you. Do exercises exactly as told by your health care provider and adjust them as directed. It is normal to feel mild stretching, pulling, tightness, or discomfort as you do these exercises. Stop right away if you feel sudden pain or your pain gets worse. Do not begin these exercises until told by your health care provider. Stretching and range-of-motion exercises These exercises warm up your muscles and joints and improve the movement and flexibility of your back. These exercises also help to relieve pain, numbness, and tingling. Lumbar rotation  Lie on your back on a firm bed or the floor with your knees bent. Straighten your arms out to your sides so each arm forms a 90-degree angle (right angle) with a side of your body. Slowly move (rotate) both of your knees to one side of your body until you feel a stretch in your lower back (lumbar). Try not to let your shoulders lift off the floor. Hold this position for __________ seconds. Tense your abdominal muscles and slowly move your knees back to the starting position. Repeat this exercise on the other side of your body. Repeat __________ times. Complete this exercise __________ times a day. Single knee to chest  Lie on your back on a firm bed or the floor with both legs straight. Bend one of your knees. Use your hands to move your knee up toward your chest until you feel a gentle stretch in your lower back and buttock. Hold your leg in this position by holding on to the front of your knee. Keep your other leg as straight as possible. Hold this position for __________ seconds. Slowly return to the starting position. Repeat with your other leg. Repeat __________ times. Complete this exercise __________ times a day. Prone extension on elbows  Lie on your abdomen on a firm bed or the floor (prone position). Prop yourself up on your elbows. Use your arms  to help lift your chest up until you feel a gentle stretch in your abdomen and your lower back. This will place some of your body weight on your elbows. If this is uncomfortable, try stacking pillows under your chest. Your hips should stay down, against the surface that you are lying on. Keep your hip and back muscles relaxed. Hold this position for __________ seconds. Slowly relax your upper body and return to the starting position. Repeat __________ times. Complete this exercise __________ times a day. Strengthening exercises These exercises build strength and endurance in your back. Endurance is the ability to use your muscles for a long time, even after they get tired. Pelvic tilt This exercise strengthens the muscles that lie deep in the abdomen. Lie on your back on a firm bed or the floor with your legs extended. Bend your knees so they are pointing toward the ceiling and your feet are flat on the floor. Tighten your lower abdominal muscles to press your lower back against the floor. This motion will tilt your pelvis so your tailbone points up toward the ceiling instead of pointing to your feet or the floor. To help with this exercise, you may place a small towel under your lower back and try to push your back into the towel. Hold this position for __________ seconds. Let your muscles relax completely before you repeat this exercise. Repeat __________ times. Complete this exercise __________ times a day. Alternating arm and leg raises  Get on your hands  and knees on a firm surface. If you are on a hard floor, you may want to use padding, such as an exercise mat, to cushion your knees. Line up your arms and legs. Your hands should be directly below your shoulders, and your knees should be directly below your hips. Lift your left leg behind you. At the same time, raise your right arm and straighten it in front of you. Do not lift your leg higher than your hip. Do not lift your arm higher  than your shoulder. Keep your abdominal and back muscles tight. Keep your hips facing the ground. Do not arch your back. Keep your balance carefully, and do not hold your breath. Hold this position for __________ seconds. Slowly return to the starting position. Repeat with your right leg and your left arm. Repeat __________ times. Complete this exercise __________ times a day. Abdominal set with straight leg raise  Lie on your back on a firm bed or the floor. Bend one of your knees and keep your other leg straight. Tense your abdominal muscles and lift your straight leg up, 4-6 inches (10-15 cm) off the ground. Keep your abdominal muscles tight and hold this position for __________ seconds. Do not hold your breath. Do not arch your back. Keep it flat against the ground. Keep your abdominal muscles tense as you slowly lower your leg back to the starting position. Repeat with your other leg. Repeat __________ times. Complete this exercise __________ times a day. Single leg lower with bent knees Lie on your back on a firm bed or the floor. Tense your abdominal muscles and lift your feet off the floor, one foot at a time, so your knees and hips are bent in 90-degree angles (right angles). Your knees should be over your hips and your lower legs should be parallel to the floor. Keeping your abdominal muscles tense and your knee bent, slowly lower one of your legs so your toe touches the ground. Lift your leg back up to return to the starting position. Do not hold your breath. Do not let your back arch. Keep your back flat against the ground. Repeat with your other leg. Repeat __________ times. Complete this exercise __________ times a day. Posture and body mechanics Good posture and healthy body mechanics can help to relieve stress in your body's tissues and joints. Body mechanics refers to the movements and positions of your body while you do your daily activities. Posture is part of body  mechanics. Good posture means: Your spine is in its natural S-curve position (neutral). Your shoulders are pulled back slightly. Your head is not tipped forward (neutral). Follow these guidelines to improve your posture and body mechanics in your everyday activities. Standing  When standing, keep your spine neutral and your feet about hip-width apart. Keep a slight bend in your knees. Your ears, shoulders, and hips should line up. When you do a task in which you stand in one place for a long time, place one foot up on a stable object that is 2-4 inches (5-10 cm) high, such as a footstool. This helps keep your spine neutral. Sitting  When sitting, keep your spine neutral and keep your feet flat on the floor. Use a footrest, if necessary, and keep your thighs parallel to the floor. Avoid rounding your shoulders, and avoid tilting your head forward. When working at a desk or a computer, keep your desk at a height where your hands are slightly lower than your elbows. Slide your  chair under your desk so you are close enough to maintain good posture. When working at a computer, place your monitor at a height where you are looking straight ahead and you do not have to tilt your head forward or downward to look at the screen. Resting When lying down and resting, avoid positions that are most painful for you. If you have pain with activities such as sitting, bending, stooping, or squatting, lie in a position in which your body does not bend very much. For example, avoid curling up on your side with your arms and knees near your chest (fetal position). If you have pain with activities such as standing for a long time or reaching with your arms, lie with your spine in a neutral position and bend your knees slightly. Try the following positions: Lying on your side with a pillow between your knees. Lying on your back with a pillow under your knees. Lifting  When lifting objects, keep your feet at least  shoulder-width apart and tighten your abdominal muscles. Bend your knees and hips and keep your spine neutral. It is important to lift using the strength of your legs, not your back. Do not lock your knees straight out. Always ask for help to lift heavy or awkward objects. This information is not intended to replace advice given to you by your health care provider. Make sure you discuss any questions you have with your health care provider. Document Revised: 02/01/2023 Document Reviewed: 12/16/2020 [employer] Patient Education  2024 [employer]  Shoulder Exercises Ask your health care provider which exercises are safe for you. Do exercises exactly as told by your health care provider and adjust them as directed. It is normal to feel mild stretching, pulling, tightness, or discomfort as you do these exercises. Stop right away if you feel sudden pain or your pain gets worse. Do not begin these exercises until told by your health care provider. Stretching exercises External rotation and abduction This exercise is sometimes called corner stretch. The exercise rotates your arm outward (external rotation) and moves your arm out from your body (abduction). Stand in a doorway with one of your feet slightly in front of the other. This is called a staggered stance. If you cannot reach your forearms to the door frame, stand facing a corner of a room. Choose one of the following positions as told by your health care provider: Place your hands and forearms on the door frame above your head. Place your hands and forearms on the door frame at the height of your head. Place your hands on the door frame at the height of your elbows. Slowly move your weight onto your front foot until you feel a stretch across your chest and in the front of your shoulders. Keep your head and chest upright and keep your abdominal muscles tight. Hold for __________ seconds. To release the stretch, shift your weight to your back  foot. Repeat __________ times. Complete this exercise __________ times a day. Extension, standing  Stand and hold a broomstick, a cane, or a similar object behind your back. Your hands should be a little wider than shoulder-width apart. Your palms should face away from your back. Keeping your elbows straight and your shoulder muscles relaxed, move the stick away from your body until you feel a stretch in your shoulders (extension). Avoid shrugging your shoulders while you move the stick. Keep your shoulder blades tucked down toward the middle of your back. Hold for __________ seconds. Slowly return  to the starting position. Repeat __________ times. Complete this exercise __________ times a day. Range-of-motion exercises Pendulum  Stand near a wall or a surface that you can hold onto for balance. Bend at the waist and let your left / right arm hang straight down. Use your other arm to support you. Keep your back straight and do not lock your knees. Relax your left / right arm and shoulder muscles, and move your hips and your trunk so your left / right arm swings freely. Your arm should swing because of the motion of your body, not because you are using your arm or shoulder muscles. Keep moving your hips and trunk so your arm swings in the following directions, as told by your health care provider: Side to side. Forward and backward. In clockwise and counterclockwise circles. Continue each motion for __________ seconds, or for as long as told by your health care provider. Slowly return to the starting position. Repeat __________ times. Complete this exercise __________ times a day. Shoulder flexion, standing  Stand and hold a broomstick, a cane, or a similar object. Place your hands a little more than shoulder-width apart on the object. Your left / right hand should be palm-up, and your other hand should be palm-down. Keep your elbow straight and your shoulder muscles relaxed. Push the  stick up with your healthy arm to raise your left / right arm in front of your body, and then over your head until you feel a stretch in your shoulder (flexion). Avoid shrugging your shoulder while you raise your arm. Keep your shoulder blade tucked down toward the middle of your back. Hold for __________ seconds. Slowly return to the starting position. Repeat __________ times. Complete this exercise __________ times a day. Shoulder abduction, standing  Stand and hold a broomstick, a cane, or a similar object. Place your hands a little more than shoulder-width apart on the object. Your left / right hand should be palm-up, and your other hand should be palm-down. Keep your elbow straight and your shoulder muscles relaxed. Push the object across your body toward your left / right side. Raise your left / right arm to the side of your body (abduction) until you feel a stretch in your shoulder. Do not raise your arm above shoulder height unless your health care provider tells you to do that. If directed, raise your arm over your head. Avoid shrugging your shoulder while you raise your arm. Keep your shoulder blade tucked down toward the middle of your back. Hold for __________ seconds. Slowly return to the starting position. Repeat __________ times. Complete this exercise __________ times a day. Internal rotation  Place your left / right hand behind your back, palm-up. Use your other hand to dangle an exercise band, a broomstick, or a similar object over your shoulder. Grasp the band with your left / right hand so you are holding on to both ends. Gently pull up on the band until you feel a stretch in the front of your left / right shoulder. The movement of your arm toward the center of your body is called internal rotation. Avoid shrugging your shoulder while you raise your arm. Keep your shoulder blade tucked down toward the middle of your back. Hold for __________ seconds. Release the stretch by  letting go of the band and lowering your hands. Repeat __________ times. Complete this exercise __________ times a day. Strengthening exercises External rotation  Sit in a stable chair without armrests. Secure an exercise band to  a stable object at elbow height on your left / right side. Place a soft object, such as a folded towel or a small pillow, between your left / right upper arm and your body to move your elbow about 4 inches (10 cm) away from your side. Hold the end of the exercise band so it is tight and there is no slack. Keeping your elbow pressed against the soft object, slowly move your forearm out, away from your abdomen (external rotation). Keep your body steady so only your forearm moves. Hold for __________ seconds. Slowly return to the starting position. Repeat __________ times. Complete this exercise __________ times a day. Shoulder abduction  Sit in a stable chair without armrests, or stand up. Hold a __________ lb / kg weight in your left / right hand, or hold an exercise band with both hands. Start with your arms straight down and your left / right palm facing in, toward your body. Slowly lift your left / right hand out to your side (abduction). Do not lift your hand above shoulder height unless your health care provider tells you that this is safe. Keep your arms straight. Avoid shrugging your shoulder while you do this movement. Keep your shoulder blade tucked down toward the middle of your back. Hold for __________ seconds. Slowly lower your arm, and return to the starting position. Repeat __________ times. Complete this exercise __________ times a day. Shoulder extension  Sit in a stable chair without armrests, or stand up. Secure an exercise band to a stable object in front of you so it is at shoulder height. Hold one end of the exercise band in each hand. Straighten your elbows and lift your hands up to shoulder height. Squeeze your shoulder blades together as  you pull your hands down to the sides of your thighs (extension). Stop when your hands are straight down by your sides. Do not let your hands go behind your body. Hold for __________ seconds. Slowly return to the starting position. Repeat __________ times. Complete this exercise __________ times a day. Shoulder row  Sit in a stable chair without armrests, or stand up. Secure an exercise band to a stable object in front of you so it is at chest height. Hold one end of the exercise band in each hand. Position your palms so that your thumbs are facing the ceiling (neutral position). Bend each of your elbows to a 90-degree angle (right angle) and keep your upper arms at your sides. Step back or move the chair back until the band is tight and there is no slack. Slowly pull your elbows back behind you. Hold for __________ seconds. Slowly return to the starting position. Repeat __________ times. Complete this exercise __________ times a day. Shoulder press-ups  Sit in a stable chair that has armrests. Sit upright, with your feet flat on the floor. Put your hands on the armrests so your elbows are bent and your fingers are pointing forward. Your hands should be about even with the sides of your body. Push down on the armrests and use your arms to lift yourself off the chair. Straighten your elbows and lift yourself up as much as you comfortably can. Move your shoulder blades down, and avoid letting your shoulders move up toward your ears. Keep your feet on the ground. As you get stronger, your feet should support less of your body weight as you lift yourself up. Hold for __________ seconds. Slowly lower yourself back into the chair. Repeat __________ times.  Complete this exercise __________ times a day. Wall push-ups  Stand so you are facing a stable wall. Your feet should be about one arm-length away from the wall. Lean forward and place your palms on the wall at shoulder height. Keep your feet  flat on the floor as you bend your elbows and lean forward toward the wall. Hold for __________ seconds. Straighten your elbows to push yourself back to the starting position. Repeat __________ times. Complete this exercise __________ times a day. This information is not intended to replace advice given to you by your health care provider. Make sure you discuss any questions you have with your health care provider. Document Revised: 11/18/2021 Document Reviewed: 11/18/2021 [employer] Patient Education  2024 [employer]  Exercises for Chronic Knee Pain Chronic knee pain is pain that lasts longer than 3 months. For most people with chronic knee pain, exercise and weight loss is an important part of treatment. Your health care provider may want you to focus on: Making the muscles that support your knee stronger. This can take pressure off your knee and reduce pain. Preventing knee stiffness. How far you can move your knee, keeping it there or making it farther. Losing weight (if this applies) to take pressure off your knee, lower your risk for injury, and make it easier for you to exercise. Your provider will help you make an exercise program that fits your needs and physical abilities. Below are simple, low-impact exercises you can do at home. Ask your provider or physical therapist how often you should do your exercise program and how many times to repeat each exercise. General safety tips  Get your provider's approval before doing any exercises. Start slowly and stop any time you feel pain. Do not exercise if your knee pain is flaring up. Warm up first. Stretching a cold muscle can cause an injury. Do 5-10 minutes of easy movement or light stretching before beginning your exercises. Do 5-10 minutes of low-impact activity (like walking or cycling) before starting strengthening exercises. Contact your provider any time you have pain during or after exercising. Exercise can cause discomfort but  should not be painful. It is normal to be a little stiff or sore after exercising. Stretching and range-of-motion exercises Front thigh stretch  Stand up straight and support your body by holding on to a chair or resting one hand on a wall. With your legs straight and close together, bend one knee to lift your heel up toward your butt. Using one hand for support, grab your ankle with your free hand. Pull your foot up closer toward your butt to feel the stretch in front of your thigh. Hold the stretch for 30 seconds. Repeat __________ times. Complete this exercise __________ times a day. Back thigh stretch  Sit on the floor with your back straight and your legs out straight in front of you. Place the palms of your hands on the floor and slide them toward your feet as you bend at the hip. Try to touch your nose to your knees and feel the stretch in the back of your thighs. Hold for 30 seconds. Repeat __________ times. Complete this exercise __________ times a day. Calf stretch  Stand facing a wall. Place the palms of your hands flat against the wall, arms extended, and lean slightly against the wall. Get into a lunge position with one leg bent at the knee and the other leg stretched out straight behind you. Keep both feet facing the wall and increase  the bend in your knee while keeping the heel of the other leg flat on the ground. You should feel the stretch in your calf. Hold for 30 seconds. Repeat __________ times. Complete this exercise __________ times a day. Strengthening exercises Straight leg lift  Lie on your back with one knee bent and the other leg out straight. Slowly lift the straight leg without bending the knee. Lift until your foot is about 12 inches (30 cm) off the floor. Hold for 3-5 seconds and slowly lower your leg. Repeat __________ times. Complete this exercise __________ times a day. Single leg dip  Stand between two chairs and put both hands on the backs of the  chairs for support. Extend one leg out straight with your body weight resting on the heel of the standing leg. Slowly bend your standing knee to dip your body to the level that is comfortable for you. Hold for 3-5 seconds. Repeat __________ times. Complete this exercise __________ times a day. Hamstring curls  Stand straight, knees close together, facing the back of a chair. Hold on to the back of a chair with both hands. Keep one leg straight. Bend the other knee while bringing the heel up toward the butt until the knee is bent at a 90-degree angle (right angle). Hold for 3-5 seconds. Repeat __________ times. Complete this exercise __________ times a day. Wall squat  Stand straight with your back, hips, and head against a wall. Step forward one foot at a time with your back still against the wall. Your feet should be 2 feet (61 cm) from the wall at shoulder width. Keeping your back, hips, and head against the wall, slide down the wall to as close to a sitting position as you can get. Hold for 5-10 seconds, then slowly slide back up. Repeat __________ times. Complete this exercise __________ times a day. Step-ups  Stand in front of a sturdy platform or stool that is about 6 inches (15 cm) high. Slowly step up with your left / right foot, keeping your knee in line with your hip and foot. Do not let your knee bend so far that you cannot see your toes. Hold on to a chair for balance, but do not use it for support. Slowly unlock your knee and lower yourself to the starting position. Repeat __________ times. Complete this exercise __________ times a day. Contact a health care provider if: Your exercises cause pain. Your pain is worse after you exercise. Your pain prevents you from doing your exercises. This information is not intended to replace advice given to you by your health care provider. Make sure you discuss any questions you have with your health care provider. Document Revised:  10/13/2022 Document Reviewed: 10/13/2022 [employer] Patient Education  2024 [employer].

## 2024-02-03 LAB — PROTEIN / CREATININE RATIO, URINE
Creatinine, Urine: 181 mg/dL (ref 20–320)
Protein/Creat Ratio: 72 mg/g{creat} (ref 25–148)
Protein/Creatinine Ratio: 0.072 mg/mg{creat} (ref 0.025–0.148)
Total Protein, Urine: 13 mg/dL (ref 5–25)

## 2024-02-03 LAB — ANA: Anti Nuclear Antibody (ANA): NEGATIVE

## 2024-02-04 NOTE — Telephone Encounter (Signed)
 See lab note for details.

## 2024-02-04 NOTE — Progress Notes (Signed)
 ANA negative, protein creatinine ratio normal.  Please notify patient that the labs were normal.  Please forward results to his PCP.  We may cancel new patient follow-up appointment.

## 2024-02-15 ENCOUNTER — Encounter

## 2024-02-21 ENCOUNTER — Encounter

## 2024-02-25 ENCOUNTER — Encounter: Payer: Self-pay | Admitting: Nurse Practitioner

## 2024-02-25 MED ORDER — OMEPRAZOLE 20 MG PO CPDR: 20.0000 mg | DELAYED_RELEASE_CAPSULE | Freq: Every day | ORAL | 3 refills | Status: AC | PRN

## 2024-02-28 ENCOUNTER — Ambulatory Visit: Admitting: Rheumatology

## 2024-03-13 ENCOUNTER — Ambulatory Visit: Admitting: Rheumatology

## 2024-04-08 NOTE — Patient Instructions (Signed)
Be Involved in Caring For Your Health:  Taking Medications When medications are taken as directed, they can greatly improve your health. But if they are not taken as prescribed, they may not work. In some cases, not taking them correctly can be harmful. To help ensure your treatment remains effective and safe, understand your medications and how to take them. Bring your medications to each visit for review by your provider.  Your lab results, notes, and after visit summary will be available on My Chart. We strongly encourage you to use this feature. If lab results are abnormal the clinic will contact you with the appropriate steps. If the clinic does not contact you assume the results are satisfactory. You can always view your results on My Chart. If you have questions regarding your health or results, please contact the clinic during office hours. You can also ask questions on My Chart.  We at [facility] are grateful that you chose [place] to provide your care. We strive to provide evidence-based and compassionate care and are always looking for feedback. If you get a survey from the clinic please complete this so we can hear your opinions.  Cervical Radiculopathy  Cervical radiculopathy happens when a nerve in the neck (a cervical nerve) is pinched or bruised. This condition can happen because of an injury to the cervical spine (vertebrae) in the neck, or as part of the normal aging process. Pressure on the cervical nerves can cause pain or numbness that travels from the neck all the way down to the arm and fingers. This condition usually gets better with rest. Treatment may be needed if the condition does not improve. What are the causes? This condition may be caused by: A neck injury. A bulging (herniated) disk. Muscle spasms. Muscle tightness in the neck due to overuse. Arthritis. Breakdown or degeneration in the bones and joints of the spine (spondylosis) due to aging. Bone spurs  that may develop near the cervical nerves. What are the signs or symptoms? Symptoms of this condition include: Pain. The pain may travel from the neck to the arm and hand. The pain can be severe or irritating. It may get worse when you move your neck. Numbness or tingling in your arm or hand. Weakness in the affected arm and hand, in severe cases. How is this diagnosed? This condition may be diagnosed based on your symptoms, your medical history, and a physical exam. You may also have tests, including: X-rays. CT scan. MRI. Electromyogram (EMG). Nerve conduction tests. How is this treated? In many cases, treatment is not needed for this condition. With rest, the condition usually gets better over time. If treatment is needed, options may include: Wearing a soft neck collar (cervical collar) for short periods of time. Doing physical therapy to strengthen your neck muscles. Taking medicines. These may include NSAIDs, such as ibuprofen, or oral corticosteroids. Having spinal injections, in severe cases. Having surgery. This may be needed if other treatments do not help. Different types of surgery may be done depending on the cause of this condition. Follow these instructions at home: If you have a cervical collar: Wear it as told by your health care provider. Remove it only as told by your health care provider. Ask your health care provider if you can remove the cervical collar for cleaning and bathing. If you are allowed to remove the collar for cleaning or bathing: Follow instructions from your health care provider about how to remove the collar safely. Clean the  collar by wiping it with mild soap and water and drying it completely. Take out any removable pads in the collar every 1-2 days, and wash them by hand with soap and water. Let them air-dry completely before you put them back in the collar. Check your skin under the collar for irritation or sores. If you see any, tell your health  care provider. Managing pain     Take over-the-counter and prescription medicines only as told by your health care provider. If directed, put ice on the affected area. To do this: If you have a soft neck collar, remove it as told by your health care provider. Put ice in a plastic bag. Place a towel between your skin and the bag. Leave the ice on for 20 minutes, 2-3 times a day. Remove the ice if your skin turns bright red. This is very important. If you cannot feel pain, heat, or cold, you have a greater risk of damage to the area. If applying ice does not help, you can try using heat. Use the heat source that your health care provider recommends, such as a moist heat pack or a heating pad. Place a towel between your skin and the heat source. Leave the heat on for 20-30 minutes. Remove the heat if your skin turns bright red. This is especially important if you are unable to feel pain, heat, or cold. You have a greater risk of getting burned. Try a gentle neck and shoulder massage to help relieve symptoms. Activity Rest as needed. Return to your normal activities as told by your health care provider. Ask your health care provider what activities are safe for you. Do stretching and strengthening exercises as told by your health care provider or your physical therapist. You may have to avoid lifting. Ask your health care provider how much you can safely lift. General instructions Use a flat pillow when you sleep. Do not drive while wearing a cervical collar. If you do not have a cervical collar, ask your health care provider if it is safe to drive while your neck heals. Ask your health care provider if the medicine prescribed to you requires you to avoid driving or using machinery. Do not use any products that contain nicotine or tobacco. These products include cigarettes, chewing tobacco, and vaping devices, such as e-cigarettes. If you need help quitting, ask your health care provider. Keep  all follow-up visits. This is important. Contact a health care provider if: Your condition does not improve with treatment. Get help right away if: Your pain gets much worse and is not controlled with medicines. You have weakness or numbness in your hand, arm, face, or leg. You have a high fever. You have a stiff, rigid neck. You lose control of your bowels or your bladder (have incontinence). You have trouble with walking, balance, or speaking. Summary Cervical radiculopathy happens when a nerve in the neck is pinched or bruised. A nerve can get pinched from a bulging disk, arthritis, muscle spasms, or an injury to the neck. Symptoms include pain, tingling, or numbness radiating from the neck to the arm or hand. Weakness can also occur in severe cases. Treatment may include rest, wearing a cervical collar, and physical therapy. Medicines may be prescribed to help with pain. In severe cases, injections or surgery may be needed. This information is not intended to replace advice given to you by your health care provider. Make sure you discuss any questions you have with your health care provider. Document  Revised: 04/03/2021 Document Reviewed: 04/03/2021 Elsevier Patient Education  2024 [employer].

## 2024-04-12 ENCOUNTER — Encounter: Payer: Self-pay | Admitting: Nurse Practitioner

## 2024-04-12 ENCOUNTER — Ambulatory Visit (INDEPENDENT_AMBULATORY_CARE_PROVIDER_SITE_OTHER): Payer: Self-pay | Admitting: Nurse Practitioner

## 2024-04-12 VITALS — BP 103/67 | HR 66 | Temp 98.4°F | Ht 72.5 in | Wt 339.6 lb

## 2024-04-12 DIAGNOSIS — M542 Cervicalgia: Secondary | ICD-10-CM | POA: Diagnosis not present

## 2024-04-12 NOTE — Progress Notes (Signed)
 BP 103/67   Pulse 66   Temp 98.4 F (36.9 C) (Oral)   Ht 6' 0.5 (1.842 m)   Wt (!) 339 lb 9.6 oz (154 kg)   SpO2 98%   BMI 45.42 kg/m    Subjective:    Patient ID: Jesus Gardner, male    DOB: 08-06-1991, 33 y.o.   MRN: [id number]  HPI: Jesus Gardner is a 33 y.o. male  Chief Complaint  Patient presents with   Pain   NECK PAIN FOLLOW UP Follow-up today for neck pain, has been better.  Mother is back in hospital, is now at rehab - CHF exacerbation.  Had + ANA on labs and rheumatology was seen 02/02/24 to return in one year. Taking muscle relaxer more regularly which offers benefit. Continues to do massages, but has not had in awhile due to having a new baby in the home.  Had PT and found pain changed after that more to upper part of neck now and not down whole neck. Status: stable Treatments attempted: muscle relaxer  Compliant with recommended treatment: yes Relief with NSAIDs?:  moderate Location: alternates side to side Duration:months Severity: 10/10 at worst, daily pain 3/10 Quality: dull and aching, tight Frequency: a few times a year Radiation: headache Aggravating factors: lifting and movement Alleviating factors: muscle relaxer, massage, and aleeve Weakness:  no Paresthesias / decreased sensation:  no  Fevers:  no   Relevant past medical, surgical, family and social history reviewed and updated as indicated. Interim medical history since our last visit reviewed. Allergies and medications reviewed and updated.  Review of Systems  Constitutional:  Negative for activity change, diaphoresis, fatigue and fever.  Respiratory:  Negative for cough, chest tightness, shortness of breath and wheezing.   Cardiovascular:  Negative for chest pain, palpitations and leg swelling.  Gastrointestinal: Negative.   Musculoskeletal:  Positive for neck pain.  Neurological: Negative.   Psychiatric/Behavioral: Negative.      Per HPI unless specifically indicated above      Objective:    BP 103/67   Pulse 66   Temp 98.4 F (36.9 C) (Oral)   Ht 6' 0.5 (1.842 m)   Wt (!) 339 lb 9.6 oz (154 kg)   SpO2 98%   BMI 45.42 kg/m   Wt Readings from Last 3 Encounters:  04/12/24 (!) 339 lb 9.6 oz (154 kg)  02/02/24 (!) 344 lb 9.6 oz (156.3 kg)  01/21/24 (!) 342 lb 9.6 oz (155.4 kg)    Physical Exam Vitals and nursing note reviewed.  Constitutional:      General: He is awake. He is not in acute distress.    Appearance: Normal appearance. He is well-developed and well-groomed. He is obese. He is not ill-appearing or toxic-appearing.  HENT:     Head: Normocephalic.     Right Ear: Hearing and external ear normal.     Left Ear: Hearing and external ear normal.  Eyes:     General: Lids are normal.     Extraocular Movements: Extraocular movements intact.     Conjunctiva/sclera: Conjunctivae normal.  Neck:     Thyroid : No thyromegaly.     Vascular: No carotid bruit.  Cardiovascular:     Rate and Rhythm: Normal rate and regular rhythm.     Heart sounds: Normal heart sounds. No murmur heard.    No gallop.  Pulmonary:     Effort: Pulmonary effort is normal. No accessory muscle usage or respiratory distress.     Breath  sounds: Normal breath sounds. No decreased breath sounds, wheezing or rhonchi.  Abdominal:     General: Bowel sounds are normal. There is no distension.     Palpations: Abdomen is soft.     Tenderness: There is no abdominal tenderness.  Musculoskeletal:     Cervical back: No edema, signs of trauma, rigidity or crepitus. Pain with movement (with flexion only) present. Decreased range of motion (with flexion mild).     Right lower leg: No edema.     Left lower leg: No edema.  Lymphadenopathy:     Cervical: No cervical adenopathy.  Skin:    General: Skin is warm.     Capillary Refill: Capillary refill takes less than 2 seconds.  Neurological:     Mental Status: He is alert and oriented to person, place, and time.     Cranial Nerves: Cranial  nerves 2-12 are intact.     Deep Tendon Reflexes: Reflexes are normal and symmetric.     Reflex Scores:      Brachioradialis reflexes are 2+ on the right side and 2+ on the left side.      Patellar reflexes are 2+ on the right side and 2+ on the left side. Psychiatric:        Attention and Perception: Attention normal.        Mood and Affect: Mood normal.        Speech: Speech normal.        Behavior: Behavior normal. Behavior is cooperative.        Thought Content: Thought content normal.    Results for orders placed or performed in visit on 02/02/24  ANA   Collection Time: 02/02/24  9:57 AM  Result Value Ref Range   Anti Nuclear Antibody (ANA) NEGATIVE NEGATIVE  Protein / creatinine ratio, urine   Collection Time: 02/02/24  9:57 AM  Result Value Ref Range   Creatinine, Urine 181 20 - 320 mg/dL   Protein/Creat Ratio 72 25 - 148 mg/g creat   Protein/Creatinine Ratio 0.072 0.025 - 0.148 mg/mg creat   Total Protein, Urine 13 5 - 25 mg/dL      Assessment & Plan:   Problem List Items Addressed This Visit       Other   Neck pain - Primary   Chronic and ongoing for over 4 years now, no injury or event prior to pain. PT did offer some benefit, as does muscle relaxer.  Will continue this.  Consider MRI in future if ongoing pain, may need medication to help with this due to claustrophobia. Continue current medication regimen at home and massage therapy.           Follow up plan: Return in about 8 months (around 12/18/2024) for Annual Physical.

## 2024-04-12 NOTE — Assessment & Plan Note (Signed)
 Chronic and ongoing for over 4 years now, no injury or event prior to pain. PT did offer some benefit, as does muscle relaxer.  Will continue this.  Consider MRI in future if ongoing pain, may need medication to help with this due to claustrophobia. Continue current medication regimen at home and massage therapy.

## 2024-08-10 ENCOUNTER — Other Ambulatory Visit: Payer: Self-pay | Admitting: Nurse Practitioner

## 2024-08-10 DIAGNOSIS — M542 Cervicalgia: Secondary | ICD-10-CM

## 2024-08-11 NOTE — Telephone Encounter (Signed)
 Requested medication (s) are due for refill today: na   Requested medication (s) are on the active medication list: yes   Last refill:  12/13/23 #60 3 refills  Future visit scheduled: yes 12/11/24  Notes to clinic:  not delegated per protocol. Do you want to refill Rx?     Requested Prescriptions  Pending Prescriptions Disp Refills   methocarbamol  (ROBAXIN ) 750 MG tablet [Pharmacy Med Name: METHOCARBAMOL  750MG  TABLETS] 60 tablet 3    Sig: TAKE 1 TO 2 TABLETS(750 TO 1500 MG) BY MOUTH EVERY 8 HOURS AS NEEDED FOR MUSCLE SPASMS     Not Delegated - Analgesics:  Muscle Relaxants Failed - 08/11/2024  4:07 PM      Failed - This refill cannot be delegated      Passed - Valid encounter within last 6 months    Recent Outpatient Visits           4 months ago Neck pain   [place] [facility] [place], Hedrick T, NP   6 months ago RUQ pain   [place] [facility] [place], [place] T, NP   7 months ago Ear fullness, bilateral   [place] [facility] [place], Melanie DASEN, NP

## 2024-09-04 DIAGNOSIS — J3489 Other specified disorders of nose and nasal sinuses: Secondary | ICD-10-CM | POA: Diagnosis not present

## 2024-09-04 DIAGNOSIS — J342 Deviated nasal septum: Secondary | ICD-10-CM | POA: Diagnosis not present

## 2024-09-04 DIAGNOSIS — G4733 Obstructive sleep apnea (adult) (pediatric): Secondary | ICD-10-CM | POA: Diagnosis not present

## 2024-09-15 ENCOUNTER — Other Ambulatory Visit: Payer: Self-pay

## 2024-09-15 MED ORDER — OMEPRAZOLE 20 MG PO CPDR
20.0000 mg | DELAYED_RELEASE_CAPSULE | Freq: Every day | ORAL | 2 refills | Status: AC | PRN
  Filled 2024-09-15: qty 30, 30d supply, fill #0

## 2024-09-15 MED ORDER — METHOCARBAMOL 750 MG PO TABS
1500.0000 mg | ORAL_TABLET | Freq: Three times a day (TID) | ORAL | 3 refills | Status: AC | PRN
  Filled 2024-09-15 – 2024-10-29 (×2): qty 60, 10d supply, fill #0

## 2024-10-29 ENCOUNTER — Other Ambulatory Visit: Payer: Self-pay

## 2024-10-29 ENCOUNTER — Other Ambulatory Visit: Payer: Self-pay | Admitting: Nurse Practitioner

## 2024-10-30 ENCOUNTER — Other Ambulatory Visit: Payer: Self-pay

## 2024-10-30 MED FILL — Omeprazole Cap Delayed Release 20 MG: ORAL | 30 days supply | Qty: 30 | Fill #0 | Status: AC

## 2024-10-30 NOTE — Telephone Encounter (Signed)
 Requested Prescriptions  Pending Prescriptions Disp Refills   omeprazole  (PRILOSEC) 20 MG capsule 30 capsule 2    Sig: Take 1 capsule (20 mg total) by mouth daily as needed.     Gastroenterology: Proton Pump Inhibitors Passed - 10/30/2024  4:45 PM      Passed - Valid encounter within last 12 months    Recent Outpatient Visits           6 months ago Neck pain   [place] [facility] [place], Melanie DASEN, NP   9 months ago RUQ pain   [place] [facility] [place], Doyline T, NP   10 months ago Ear fullness, bilateral   [place] [facility] [place], Melanie DASEN, NP

## 2024-12-11 ENCOUNTER — Encounter: Admitting: Nurse Practitioner

## 2025-01-31 ENCOUNTER — Ambulatory Visit: Admitting: Rheumatology
